# Patient Record
Sex: Female | Born: 1950
Health system: Southern US, Community
[De-identification: ages and names within clinical notes are randomized; demographics above are authoritative.]

## PROBLEM LIST (undated history)

## (undated) DIAGNOSIS — I10 Essential (primary) hypertension: Secondary | ICD-10-CM

## (undated) DIAGNOSIS — K219 Gastro-esophageal reflux disease without esophagitis: Secondary | ICD-10-CM

## (undated) DIAGNOSIS — D649 Anemia, unspecified: Secondary | ICD-10-CM

## (undated) DIAGNOSIS — M1990 Unspecified osteoarthritis, unspecified site: Secondary | ICD-10-CM

## (undated) DIAGNOSIS — R000 Tachycardia, unspecified: Secondary | ICD-10-CM

## (undated) DIAGNOSIS — R0600 Dyspnea, unspecified: Secondary | ICD-10-CM

## (undated) DIAGNOSIS — J189 Pneumonia, unspecified organism: Secondary | ICD-10-CM

## (undated) DIAGNOSIS — C801 Malignant (primary) neoplasm, unspecified: Secondary | ICD-10-CM

## (undated) DIAGNOSIS — K635 Polyp of colon: Secondary | ICD-10-CM

## (undated) DIAGNOSIS — F419 Anxiety disorder, unspecified: Secondary | ICD-10-CM

## (undated) DIAGNOSIS — F32A Depression, unspecified: Secondary | ICD-10-CM

## (undated) HISTORY — PX: APPENDECTOMY: SHX54

## (undated) HISTORY — PX: COLONOSCOPY: SHX174

## (undated) HISTORY — PX: DENTAL SURGERY: SHX609

## (undated) HISTORY — PX: LAPAROSCOPIC SALPINGO OOPHERECTOMY: SHX5927

## (undated) HISTORY — PX: OTHER SURGICAL HISTORY: SHX169

## (undated) HISTORY — PX: MOHS SURGERY: SUR867

## (undated) HISTORY — PX: LINGUAL FRENECTOMY: SHX6357

---

## 1998-06-10 ENCOUNTER — Other Ambulatory Visit: Admission: RE | Admit: 1998-06-10 | Discharge: 1998-06-10 | Payer: Self-pay | Admitting: *Deleted

## 1999-03-28 ENCOUNTER — Ambulatory Visit (HOSPITAL_COMMUNITY): Admission: RE | Admit: 1999-03-28 | Discharge: 1999-03-28 | Payer: Self-pay | Admitting: Family Medicine

## 1999-04-04 ENCOUNTER — Other Ambulatory Visit: Admission: RE | Admit: 1999-04-04 | Discharge: 1999-04-04 | Payer: Self-pay | Admitting: *Deleted

## 1999-09-23 ENCOUNTER — Encounter: Admission: RE | Admit: 1999-09-23 | Discharge: 1999-09-23 | Payer: Self-pay | Admitting: Family Medicine

## 1999-09-23 ENCOUNTER — Encounter: Payer: Self-pay | Admitting: Family Medicine

## 2001-08-17 ENCOUNTER — Other Ambulatory Visit: Admission: RE | Admit: 2001-08-17 | Discharge: 2001-08-17 | Payer: Self-pay | Admitting: Family Medicine

## 2001-11-23 ENCOUNTER — Encounter: Payer: Self-pay | Admitting: Allergy and Immunology

## 2001-11-23 ENCOUNTER — Encounter: Admission: RE | Admit: 2001-11-23 | Discharge: 2001-11-23 | Payer: Self-pay | Admitting: *Deleted

## 2006-07-01 ENCOUNTER — Other Ambulatory Visit: Admission: RE | Admit: 2006-07-01 | Discharge: 2006-07-01 | Payer: Self-pay | Admitting: Family Medicine

## 2010-10-23 ENCOUNTER — Other Ambulatory Visit (HOSPITAL_COMMUNITY)
Admission: RE | Admit: 2010-10-23 | Discharge: 2010-10-23 | Disposition: A | Discharge status: Home / Self Care | Source: Ambulatory Visit | Attending: Family Medicine | Admitting: Family Medicine

## 2010-10-23 DIAGNOSIS — Z01419 Encounter for gynecological examination (general) (routine) without abnormal findings: Secondary | ICD-10-CM | POA: Insufficient documentation

## 2010-10-23 DIAGNOSIS — Z1159 Encounter for screening for other viral diseases: Secondary | ICD-10-CM | POA: Insufficient documentation

## 2011-11-15 DIAGNOSIS — R918 Other nonspecific abnormal finding of lung field: Secondary | ICD-10-CM

## 2011-11-15 HISTORY — DX: Other nonspecific abnormal finding of lung field: R91.8

## 2014-06-29 DIAGNOSIS — D225 Melanocytic nevi of trunk: Secondary | ICD-10-CM | POA: Diagnosis not present

## 2014-06-29 DIAGNOSIS — L820 Inflamed seborrheic keratosis: Secondary | ICD-10-CM | POA: Diagnosis not present

## 2014-06-29 DIAGNOSIS — D1801 Hemangioma of skin and subcutaneous tissue: Secondary | ICD-10-CM | POA: Diagnosis not present

## 2014-06-29 DIAGNOSIS — L814 Other melanin hyperpigmentation: Secondary | ICD-10-CM | POA: Diagnosis not present

## 2014-06-29 DIAGNOSIS — D2271 Melanocytic nevi of right lower limb, including hip: Secondary | ICD-10-CM | POA: Diagnosis not present

## 2014-06-29 DIAGNOSIS — D2239 Melanocytic nevi of other parts of face: Secondary | ICD-10-CM | POA: Diagnosis not present

## 2014-06-29 DIAGNOSIS — L720 Epidermal cyst: Secondary | ICD-10-CM | POA: Diagnosis not present

## 2014-06-29 DIAGNOSIS — Z85828 Personal history of other malignant neoplasm of skin: Secondary | ICD-10-CM | POA: Diagnosis not present

## 2015-05-30 DIAGNOSIS — H401133 Primary open-angle glaucoma, bilateral, severe stage: Secondary | ICD-10-CM | POA: Diagnosis not present

## 2015-06-13 DIAGNOSIS — D2271 Melanocytic nevi of right lower limb, including hip: Secondary | ICD-10-CM | POA: Diagnosis not present

## 2015-06-13 DIAGNOSIS — D225 Melanocytic nevi of trunk: Secondary | ICD-10-CM | POA: Diagnosis not present

## 2015-06-13 DIAGNOSIS — Z85828 Personal history of other malignant neoplasm of skin: Secondary | ICD-10-CM | POA: Diagnosis not present

## 2015-06-13 DIAGNOSIS — D2262 Melanocytic nevi of left upper limb, including shoulder: Secondary | ICD-10-CM | POA: Diagnosis not present

## 2015-06-13 DIAGNOSIS — D2239 Melanocytic nevi of other parts of face: Secondary | ICD-10-CM | POA: Diagnosis not present

## 2015-06-13 DIAGNOSIS — L821 Other seborrheic keratosis: Secondary | ICD-10-CM | POA: Diagnosis not present

## 2015-06-13 DIAGNOSIS — L570 Actinic keratosis: Secondary | ICD-10-CM | POA: Diagnosis not present

## 2015-06-13 DIAGNOSIS — D1801 Hemangioma of skin and subcutaneous tissue: Secondary | ICD-10-CM | POA: Diagnosis not present

## 2015-06-13 DIAGNOSIS — D485 Neoplasm of uncertain behavior of skin: Secondary | ICD-10-CM | POA: Diagnosis not present

## 2015-07-04 DIAGNOSIS — H401133 Primary open-angle glaucoma, bilateral, severe stage: Secondary | ICD-10-CM | POA: Diagnosis not present

## 2015-07-08 DIAGNOSIS — M9902 Segmental and somatic dysfunction of thoracic region: Secondary | ICD-10-CM | POA: Diagnosis not present

## 2015-07-08 DIAGNOSIS — M545 Low back pain: Secondary | ICD-10-CM | POA: Diagnosis not present

## 2015-07-08 DIAGNOSIS — M9903 Segmental and somatic dysfunction of lumbar region: Secondary | ICD-10-CM | POA: Diagnosis not present

## 2015-07-08 DIAGNOSIS — M546 Pain in thoracic spine: Secondary | ICD-10-CM | POA: Diagnosis not present

## 2015-07-10 DIAGNOSIS — M545 Low back pain: Secondary | ICD-10-CM | POA: Diagnosis not present

## 2015-07-10 DIAGNOSIS — M9902 Segmental and somatic dysfunction of thoracic region: Secondary | ICD-10-CM | POA: Diagnosis not present

## 2015-07-10 DIAGNOSIS — M9903 Segmental and somatic dysfunction of lumbar region: Secondary | ICD-10-CM | POA: Diagnosis not present

## 2015-07-10 DIAGNOSIS — M546 Pain in thoracic spine: Secondary | ICD-10-CM | POA: Diagnosis not present

## 2015-07-25 DIAGNOSIS — M546 Pain in thoracic spine: Secondary | ICD-10-CM | POA: Diagnosis not present

## 2015-07-25 DIAGNOSIS — M9903 Segmental and somatic dysfunction of lumbar region: Secondary | ICD-10-CM | POA: Diagnosis not present

## 2015-07-25 DIAGNOSIS — M9902 Segmental and somatic dysfunction of thoracic region: Secondary | ICD-10-CM | POA: Diagnosis not present

## 2015-07-25 DIAGNOSIS — M545 Low back pain: Secondary | ICD-10-CM | POA: Diagnosis not present

## 2015-08-06 DIAGNOSIS — M9903 Segmental and somatic dysfunction of lumbar region: Secondary | ICD-10-CM | POA: Diagnosis not present

## 2015-08-06 DIAGNOSIS — M9902 Segmental and somatic dysfunction of thoracic region: Secondary | ICD-10-CM | POA: Diagnosis not present

## 2015-08-06 DIAGNOSIS — M546 Pain in thoracic spine: Secondary | ICD-10-CM | POA: Diagnosis not present

## 2015-08-06 DIAGNOSIS — M545 Low back pain: Secondary | ICD-10-CM | POA: Diagnosis not present

## 2015-09-24 DIAGNOSIS — E0811 Diabetes mellitus due to underlying condition with ketoacidosis with coma: Secondary | ICD-10-CM | POA: Diagnosis not present

## 2015-09-24 DIAGNOSIS — I10 Essential (primary) hypertension: Secondary | ICD-10-CM | POA: Diagnosis not present

## 2015-09-24 DIAGNOSIS — F331 Major depressive disorder, recurrent, moderate: Secondary | ICD-10-CM | POA: Diagnosis not present

## 2015-09-24 DIAGNOSIS — Z6822 Body mass index (BMI) 22.0-22.9, adult: Secondary | ICD-10-CM | POA: Diagnosis not present

## 2015-09-27 DIAGNOSIS — M546 Pain in thoracic spine: Secondary | ICD-10-CM | POA: Diagnosis not present

## 2015-09-27 DIAGNOSIS — M542 Cervicalgia: Secondary | ICD-10-CM | POA: Diagnosis not present

## 2015-09-27 DIAGNOSIS — M9903 Segmental and somatic dysfunction of lumbar region: Secondary | ICD-10-CM | POA: Diagnosis not present

## 2015-09-27 DIAGNOSIS — M9902 Segmental and somatic dysfunction of thoracic region: Secondary | ICD-10-CM | POA: Diagnosis not present

## 2015-09-27 DIAGNOSIS — M545 Low back pain: Secondary | ICD-10-CM | POA: Diagnosis not present

## 2015-09-27 DIAGNOSIS — M9901 Segmental and somatic dysfunction of cervical region: Secondary | ICD-10-CM | POA: Diagnosis not present

## 2015-10-08 DIAGNOSIS — F331 Major depressive disorder, recurrent, moderate: Secondary | ICD-10-CM | POA: Diagnosis not present

## 2015-10-08 DIAGNOSIS — I10 Essential (primary) hypertension: Secondary | ICD-10-CM | POA: Diagnosis not present

## 2015-10-14 DIAGNOSIS — K921 Melena: Secondary | ICD-10-CM | POA: Diagnosis not present

## 2015-10-14 DIAGNOSIS — R634 Abnormal weight loss: Secondary | ICD-10-CM | POA: Diagnosis not present

## 2015-10-14 DIAGNOSIS — Z800 Family history of malignant neoplasm of digestive organs: Secondary | ICD-10-CM | POA: Diagnosis not present

## 2015-10-15 ENCOUNTER — Other Ambulatory Visit: Payer: Self-pay | Admitting: Family Medicine

## 2015-10-15 DIAGNOSIS — H401190 Primary open-angle glaucoma, unspecified eye, stage unspecified: Secondary | ICD-10-CM

## 2015-10-15 DIAGNOSIS — E2839 Other primary ovarian failure: Secondary | ICD-10-CM

## 2015-10-15 DIAGNOSIS — Z1231 Encounter for screening mammogram for malignant neoplasm of breast: Secondary | ICD-10-CM

## 2015-10-15 HISTORY — DX: Primary open-angle glaucoma, unspecified eye, stage unspecified: H40.1190

## 2015-10-24 DIAGNOSIS — H401133 Primary open-angle glaucoma, bilateral, severe stage: Secondary | ICD-10-CM | POA: Diagnosis not present

## 2015-10-25 DIAGNOSIS — M545 Low back pain: Secondary | ICD-10-CM | POA: Diagnosis not present

## 2015-10-25 DIAGNOSIS — M542 Cervicalgia: Secondary | ICD-10-CM | POA: Diagnosis not present

## 2015-10-25 DIAGNOSIS — M9902 Segmental and somatic dysfunction of thoracic region: Secondary | ICD-10-CM | POA: Diagnosis not present

## 2015-10-25 DIAGNOSIS — M9903 Segmental and somatic dysfunction of lumbar region: Secondary | ICD-10-CM | POA: Diagnosis not present

## 2015-10-25 DIAGNOSIS — M546 Pain in thoracic spine: Secondary | ICD-10-CM | POA: Diagnosis not present

## 2015-10-25 DIAGNOSIS — M9901 Segmental and somatic dysfunction of cervical region: Secondary | ICD-10-CM | POA: Diagnosis not present

## 2015-11-05 DIAGNOSIS — Z1231 Encounter for screening mammogram for malignant neoplasm of breast: Secondary | ICD-10-CM | POA: Diagnosis not present

## 2015-11-07 DIAGNOSIS — M546 Pain in thoracic spine: Secondary | ICD-10-CM | POA: Diagnosis not present

## 2015-11-07 DIAGNOSIS — M9903 Segmental and somatic dysfunction of lumbar region: Secondary | ICD-10-CM | POA: Diagnosis not present

## 2015-11-07 DIAGNOSIS — M9901 Segmental and somatic dysfunction of cervical region: Secondary | ICD-10-CM | POA: Diagnosis not present

## 2015-11-07 DIAGNOSIS — M542 Cervicalgia: Secondary | ICD-10-CM | POA: Diagnosis not present

## 2015-11-07 DIAGNOSIS — M545 Low back pain: Secondary | ICD-10-CM | POA: Diagnosis not present

## 2015-11-07 DIAGNOSIS — M9902 Segmental and somatic dysfunction of thoracic region: Secondary | ICD-10-CM | POA: Diagnosis not present

## 2015-11-08 DIAGNOSIS — I10 Essential (primary) hypertension: Secondary | ICD-10-CM | POA: Diagnosis not present

## 2015-11-08 DIAGNOSIS — F331 Major depressive disorder, recurrent, moderate: Secondary | ICD-10-CM | POA: Diagnosis not present

## 2015-11-08 DIAGNOSIS — R011 Cardiac murmur, unspecified: Secondary | ICD-10-CM | POA: Diagnosis not present

## 2015-11-08 DIAGNOSIS — Z6820 Body mass index (BMI) 20.0-20.9, adult: Secondary | ICD-10-CM | POA: Diagnosis not present

## 2015-11-11 DIAGNOSIS — M9902 Segmental and somatic dysfunction of thoracic region: Secondary | ICD-10-CM | POA: Diagnosis not present

## 2015-11-11 DIAGNOSIS — M545 Low back pain: Secondary | ICD-10-CM | POA: Diagnosis not present

## 2015-11-11 DIAGNOSIS — M546 Pain in thoracic spine: Secondary | ICD-10-CM | POA: Diagnosis not present

## 2015-11-11 DIAGNOSIS — M9903 Segmental and somatic dysfunction of lumbar region: Secondary | ICD-10-CM | POA: Diagnosis not present

## 2015-11-11 DIAGNOSIS — M542 Cervicalgia: Secondary | ICD-10-CM | POA: Diagnosis not present

## 2015-11-11 DIAGNOSIS — I10 Essential (primary) hypertension: Secondary | ICD-10-CM | POA: Diagnosis not present

## 2015-11-11 DIAGNOSIS — M9901 Segmental and somatic dysfunction of cervical region: Secondary | ICD-10-CM | POA: Diagnosis not present

## 2015-11-19 DIAGNOSIS — K625 Hemorrhage of anus and rectum: Secondary | ICD-10-CM | POA: Diagnosis not present

## 2015-11-19 DIAGNOSIS — D126 Benign neoplasm of colon, unspecified: Secondary | ICD-10-CM | POA: Diagnosis not present

## 2015-11-19 DIAGNOSIS — K5730 Diverticulosis of large intestine without perforation or abscess without bleeding: Secondary | ICD-10-CM | POA: Diagnosis not present

## 2015-11-19 DIAGNOSIS — D122 Benign neoplasm of ascending colon: Secondary | ICD-10-CM | POA: Diagnosis not present

## 2015-11-19 DIAGNOSIS — Z800 Family history of malignant neoplasm of digestive organs: Secondary | ICD-10-CM | POA: Diagnosis not present

## 2015-11-21 DIAGNOSIS — D126 Benign neoplasm of colon, unspecified: Secondary | ICD-10-CM | POA: Diagnosis not present

## 2015-11-27 DIAGNOSIS — I6523 Occlusion and stenosis of bilateral carotid arteries: Secondary | ICD-10-CM | POA: Diagnosis not present

## 2015-11-27 DIAGNOSIS — R011 Cardiac murmur, unspecified: Secondary | ICD-10-CM | POA: Diagnosis not present

## 2015-12-14 DIAGNOSIS — Z7982 Long term (current) use of aspirin: Secondary | ICD-10-CM | POA: Insufficient documentation

## 2015-12-14 DIAGNOSIS — R1030 Lower abdominal pain, unspecified: Secondary | ICD-10-CM | POA: Diagnosis present

## 2015-12-14 DIAGNOSIS — K529 Noninfective gastroenteritis and colitis, unspecified: Secondary | ICD-10-CM | POA: Diagnosis not present

## 2015-12-14 DIAGNOSIS — F17200 Nicotine dependence, unspecified, uncomplicated: Secondary | ICD-10-CM | POA: Diagnosis not present

## 2015-12-15 ENCOUNTER — Emergency Department (HOSPITAL_COMMUNITY)
Admission: EM | Admit: 2015-12-15 | Discharge: 2015-12-15 | Disposition: A | Discharge status: Home / Self Care | Payer: Medicare Other | Attending: Emergency Medicine | Admitting: Emergency Medicine

## 2015-12-15 ENCOUNTER — Emergency Department (HOSPITAL_COMMUNITY): Payer: Medicare Other

## 2015-12-15 ENCOUNTER — Encounter (HOSPITAL_COMMUNITY): Payer: Self-pay | Admitting: Emergency Medicine

## 2015-12-15 DIAGNOSIS — K529 Noninfective gastroenteritis and colitis, unspecified: Secondary | ICD-10-CM | POA: Diagnosis not present

## 2015-12-15 DIAGNOSIS — R1030 Lower abdominal pain, unspecified: Secondary | ICD-10-CM | POA: Diagnosis not present

## 2015-12-15 DIAGNOSIS — Z7982 Long term (current) use of aspirin: Secondary | ICD-10-CM | POA: Diagnosis not present

## 2015-12-15 DIAGNOSIS — F17200 Nicotine dependence, unspecified, uncomplicated: Secondary | ICD-10-CM | POA: Diagnosis not present

## 2015-12-15 HISTORY — DX: Polyp of colon: K63.5

## 2015-12-15 LAB — COMPREHENSIVE METABOLIC PANEL
ALBUMIN: 4.2 g/dL (ref 3.5–5.0)
ALK PHOS: 75 U/L (ref 38–126)
ALT: 23 U/L (ref 14–54)
AST: 27 U/L (ref 15–41)
Anion gap: 9 (ref 5–15)
BUN: 16 mg/dL (ref 6–20)
CALCIUM: 9.5 mg/dL (ref 8.9–10.3)
CO2: 26 mmol/L (ref 22–32)
CREATININE: 1.27 mg/dL — AB (ref 0.44–1.00)
Chloride: 102 mmol/L (ref 101–111)
GFR calc Af Amer: 50 mL/min — ABNORMAL LOW (ref >60)
GFR calc non Af Amer: 43 mL/min — ABNORMAL LOW (ref >60)
GLUCOSE: 123 mg/dL — AB (ref 65–99)
Potassium: 3.6 mmol/L (ref 3.5–5.1)
SODIUM: 137 mmol/L (ref 135–145)
Total Bilirubin: 0.7 mg/dL (ref 0.3–1.2)
Total Protein: 6.6 g/dL (ref 6.5–8.1)

## 2015-12-15 LAB — URINE MICROSCOPIC-ADD ON

## 2015-12-15 LAB — CBC
HCT: 44.7 % (ref 36.0–46.0)
Hemoglobin: 14.9 g/dL (ref 12.0–15.0)
MCH: 30.3 pg (ref 26.0–34.0)
MCHC: 33.3 g/dL (ref 30.0–36.0)
MCV: 90.9 fL (ref 78.0–100.0)
PLATELETS: 284 10*3/uL (ref 150–400)
RBC: 4.92 MIL/uL (ref 3.87–5.11)
RDW: 13.5 % (ref 11.5–15.5)
WBC: 12.4 10*3/uL — ABNORMAL HIGH (ref 4.0–10.5)

## 2015-12-15 LAB — URINALYSIS, ROUTINE W REFLEX MICROSCOPIC
BILIRUBIN URINE: NEGATIVE
GLUCOSE, UA: NEGATIVE mg/dL
Ketones, ur: NEGATIVE mg/dL
Nitrite: NEGATIVE
Protein, ur: NEGATIVE mg/dL
SPECIFIC GRAVITY, URINE: 1.007 (ref 1.005–1.030)
pH: 6.5 (ref 5.0–8.0)

## 2015-12-15 LAB — LIPASE, BLOOD: Lipase: 50 U/L (ref 11–51)

## 2015-12-15 MED ORDER — IOPAMIDOL (ISOVUE-300) INJECTION 61%
INTRAVENOUS | Status: AC
  Administered 2015-12-15: 100 mL
  Filled 2015-12-15: qty 100

## 2015-12-15 MED ORDER — ONDANSETRON 4 MG PO TBDP: 4.0000 mg | ORAL_TABLET | Freq: Three times a day (TID) | ORAL | 0 refills | Status: DC | PRN

## 2015-12-15 MED ORDER — SODIUM CHLORIDE 0.9 % IV BOLUS (SEPSIS)
1000.0000 mL | Freq: Once | INTRAVENOUS | Status: AC
  Administered 2015-12-15: 1000 mL via INTRAVENOUS

## 2015-12-15 MED ORDER — ONDANSETRON HCL 4 MG/2ML IJ SOLN
4.0000 mg | Freq: Once | INTRAMUSCULAR | Status: AC
  Administered 2015-12-15: 4 mg via INTRAVENOUS
  Filled 2015-12-15: qty 2

## 2015-12-15 MED ORDER — MORPHINE SULFATE (PF) 4 MG/ML IV SOLN
4.0000 mg | Freq: Once | INTRAVENOUS | Status: AC
  Administered 2015-12-15: 4 mg via INTRAVENOUS
  Filled 2015-12-15: qty 1

## 2015-12-15 NOTE — ED Notes (Signed)
Pt refusing IV fluids, and meds. Pt states she feels better and just wants to rest. Pt drinking water.

## 2015-12-15 NOTE — ED Notes (Signed)
Patient transported to CT 

## 2015-12-15 NOTE — ED Notes (Signed)
EDP explained tests results and plan of care to pt. and family .

## 2015-12-15 NOTE — ED Triage Notes (Signed)
Pt. reports mid/low abdominal pain onset this evening with diarrhea x3 , denies emesis , no fever or chills.

## 2015-12-15 NOTE — ED Provider Notes (Signed)
[facility] DEPT Provider Note   CSN: [id number] Arrival date & time: 12/14/15  2347  By signing my name below, I, Jeanell Sparrow, attest that this documentation has been prepared under the direction and in the presence of Quintella Reichert, MD . Electronically Signed: Jeanell Sparrow, Scribe. 12/15/2015. 12:17 AM.  History   Chief Complaint Chief Complaint  Patient presents with  . Abdominal Pain   The history is provided by the patient. No language interpreter was used.   HPI Comments: Carla Lane is a 65 y.o. female with a hx of HTN who presents to the Emergency Department complaining of constant moderate lower abdominal pain that started about 3 hours ago. She had a sudden onset of pain today. Reports a risk of spoiled food intake yesterday. Pt describes the pain as a pressure sensation, non-radiating, and exacerbated by movement..Associated symptoms include intermittent nausea, diarrhea (3 episodes), generalized weakness, and diaphoresis. Reports a hx of similar symptoms due to swine flu, and a surgical hx of hysterectomy and appendectomy. Denies any fever, vomiting, cough, or SOB.    Past Medical History:  Diagnosis Date  . Colon polyps     There are no active problems to display for this patient.   Past Surgical History:  Procedure Laterality Date  . COLONOSCOPY    . DENTAL SURGERY      OB History    No data available       Home Medications    Prior to Admission medications   Medication Sig Start Date End Date Taking? Authorizing Provider  aspirin 325 MG tablet Take 325 mg by mouth every 6 (six) hours as needed for mild pain.   Yes Historical Provider, MD  escitalopram (LEXAPRO) 10 MG tablet Take 10 mg by mouth daily.   Yes Historical Provider, MD  latanoprost (XALATAN) 0.005 % ophthalmic solution Place 1 drop into both eyes at bedtime.   Yes Historical Provider, MD  Telmisartan-Amlodipine 40-5 MG TABS Take 1 tablet by mouth daily.   Yes Historical Provider, MD    ondansetron (ZOFRAN ODT) 4 MG disintegrating tablet Take 1 tablet (4 mg total) by mouth every 8 (eight) hours as needed for nausea or vomiting. 12/15/15   Quintella Reichert, MD    Family History No family history on file.  Social History Social History  Substance Use Topics  . Smoking status: Current Every Day Smoker  . Smokeless tobacco: Never Used  . Alcohol use Yes     Allergies   Codeine   Review of Systems Review of Systems 10 Systems reviewed and all are negative for acute change except as noted in the HPI.  Physical Exam Updated Vital Signs BP (!) 117/54 (BP Location: Left Arm)   Pulse 60   Temp 97.3 F (36.3 C) (Oral)   Resp 18   SpO2 100%   Physical Exam  Constitutional: She is oriented to person, place, and time. She appears well-developed and well-nourished.  HENT:  Head: Normocephalic and atraumatic.  Cardiovascular: Normal rate and regular rhythm.   No murmur heard. Pulmonary/Chest: Effort normal and breath sounds normal. No respiratory distress.  Abdominal: Soft. There is tenderness. There is no rebound and no guarding.  Mild epigastric TTP.   Musculoskeletal: She exhibits no edema or tenderness.  Neurological: She is alert and oriented to person, place, and time.  Skin: Skin is warm and dry.  Psychiatric: She has a normal mood and affect. Her behavior is normal.  Nursing note and vitals reviewed.    ED  Treatments / Results  DIAGNOSTIC STUDIES: Oxygen Saturation is 100% on RA, normal by my interpretation.    COORDINATION OF CARE: 12:21 AM- Pt advised of plan for treatment and pt agrees.  1:45 AM- Pt was re-evaluated.   Labs (all labs ordered are listed, but only abnormal results are displayed) Labs Reviewed  COMPREHENSIVE METABOLIC PANEL - Abnormal; Notable for the following:       Result Value   Glucose, Bld 123 (*)    Creatinine, Ser 1.27 (*)    GFR calc non Af Amer 43 (*)    GFR calc Af Amer 50 (*)    All other components within normal  limits  CBC - Abnormal; Notable for the following:    WBC 12.4 (*)    All other components within normal limits  URINALYSIS, ROUTINE W REFLEX MICROSCOPIC (NOT AT [facility]) - Abnormal; Notable for the following:    Color, Urine STRAW (*)    Hgb urine dipstick TRACE (*)    Leukocytes, UA SMALL (*)    All other components within normal limits  URINE MICROSCOPIC-ADD ON - Abnormal; Notable for the following:    Squamous Epithelial / LPF 6-30 (*)    Bacteria, UA FEW (*)    All other components within normal limits  LIPASE, BLOOD    EKG  EKG Interpretation None       Radiology Ct Abdomen Pelvis W Contrast  Result Date: 12/15/2015 CLINICAL DATA:  Lower abdominal pain starting about 3 hours ago. Nausea and diarrhea. Weakness and diaphoresis. EXAM: CT ABDOMEN AND PELVIS WITH CONTRAST TECHNIQUE: Multidetector CT imaging of the abdomen and pelvis was performed using the standard protocol following bolus administration of intravenous contrast. CONTRAST:  100 ml ISOVUE-300 IOPAMIDOL (ISOVUE-300) INJECTION 61% COMPARISON:  None. FINDINGS: Lower chest: Linear atelectasis or fibrosis in the lung bases. Hepatobiliary: No focal liver abnormality is seen. No gallstones, gallbladder wall thickening, or biliary dilatation. Pancreas: Unremarkable. No pancreatic ductal dilatation or surrounding inflammatory changes. Spleen: Normal in size without focal abnormality. Adrenals/Urinary Tract: 2.5 cm diameter nodule in the left adrenal gland. Significant washout demonstrated between initial contrast images and delayed images, likely indicating a benign lesion. Renal nephrograms are symmetrical. No hydronephrosis or hydroureter. Bladder wall is not thickened. Stomach/Bowel: Stomach and small bowel are decompressed. Colon is mostly decompressed with scattered stool. There is suggestion of colonic wall thickening and edema in the transverse and descending portion. This may indicate evidence of colitis. Appendix is not  identified. Vascular/Lymphatic: Aortic atherosclerosis. No enlarged abdominal or pelvic lymph nodes. Reproductive: Nodular appearance of the uterus suggesting fibroids. No abnormal adnexal masses. Small amount of free fluid in the pelvis is likely physiologic. Other: Abdominal wall musculature appears intact free air in the abdomen. Musculoskeletal: Spondylolysis with mild spondylolisthesis at L5-S1. No destructive bone lesions. IMPRESSION: Decompression of the bowel limits evaluation but there is suggestion of wall thickening in the colon consistent with colitis. **An incidental finding of potential clinical significance has been found. 2.5 cm diameter nodule in the left adrenal gland with washout demonstrated suggesting a benign lesion. Consider follow-up adrenal CT in 1 year.** Electronically Signed   By: Lucienne Capers M.D.   On: 12/15/2015 04:17    Procedures Procedures (including critical care time)  Medications Ordered in ED Medications  sodium chloride 0.9 % bolus 1,000 mL (0 mLs Intravenous Stopped 12/15/15 0308)  morphine 4 MG/ML injection 4 mg (4 mg Intravenous Given 12/15/15 0220)  ondansetron (ZOFRAN) injection 4 mg (4 mg Intravenous Given 12/15/15  0220)  iopamidol (ISOVUE-300) 61 % injection (100 mLs  Contrast Given 12/15/15 0343)     Initial Impression / Assessment and Plan / ED Course  I have reviewed the triage vital signs and the nursing notes.  Pertinent labs & imaging results that were available during my care of the patient were reviewed by me and considered in my medical decision making (see chart for details).  Clinical Course    Pt here for evaluation of nausea, diarrhea, abdominal pain. Labs with mild leukocytosis, mild dehydration.  CT with colitis, no evidence of diverticulitis.  Pt is improved on recheck.  Plan to d/c home with oral fluid hydration, outpatient follow up, close return precautions.    Final Clinical Impressions(s) / ED Diagnoses   Final diagnoses:    Colitis    New Prescriptions Discharge Medication List as of 12/15/2015  4:47 AM    START taking these medications   Details  ondansetron (ZOFRAN ODT) 4 MG disintegrating tablet Take 1 tablet (4 mg total) by mouth every 8 (eight) hours as needed for nausea or vomiting., Starting Sun 12/15/2015, Print       I personally performed the services described in this documentation, which was scribed in my presence. The recorded information has been reviewed and is accurate.     Quintella Reichert, MD 12/15/15 [facility] [place]

## 2015-12-15 NOTE — Discharge Instructions (Signed)
Follow-up with your family doctor to get your labs rechecked.  Drink plenty of fluids. Your CT scan showed a nodule on your adrenal gland that is recommended to have a repeat CT scan in 1 year. Please follow-up with your family doctor for this.

## 2016-01-21 DIAGNOSIS — I10 Essential (primary) hypertension: Secondary | ICD-10-CM | POA: Diagnosis not present

## 2016-02-14 DIAGNOSIS — M545 Low back pain: Secondary | ICD-10-CM | POA: Diagnosis not present

## 2016-02-14 DIAGNOSIS — M542 Cervicalgia: Secondary | ICD-10-CM | POA: Diagnosis not present

## 2016-02-14 DIAGNOSIS — Z6821 Body mass index (BMI) 21.0-21.9, adult: Secondary | ICD-10-CM | POA: Diagnosis not present

## 2016-02-14 DIAGNOSIS — M546 Pain in thoracic spine: Secondary | ICD-10-CM | POA: Diagnosis not present

## 2016-02-14 DIAGNOSIS — M9903 Segmental and somatic dysfunction of lumbar region: Secondary | ICD-10-CM | POA: Diagnosis not present

## 2016-02-14 DIAGNOSIS — I10 Essential (primary) hypertension: Secondary | ICD-10-CM | POA: Diagnosis not present

## 2016-02-14 DIAGNOSIS — M9902 Segmental and somatic dysfunction of thoracic region: Secondary | ICD-10-CM | POA: Diagnosis not present

## 2016-02-14 DIAGNOSIS — M9901 Segmental and somatic dysfunction of cervical region: Secondary | ICD-10-CM | POA: Diagnosis not present

## 2016-02-14 DIAGNOSIS — F331 Major depressive disorder, recurrent, moderate: Secondary | ICD-10-CM | POA: Diagnosis not present

## 2016-02-14 DIAGNOSIS — K2970 Gastritis, unspecified, without bleeding: Secondary | ICD-10-CM | POA: Diagnosis not present

## 2016-02-28 DIAGNOSIS — D0461 Carcinoma in situ of skin of right upper limb, including shoulder: Secondary | ICD-10-CM | POA: Diagnosis not present

## 2016-02-28 DIAGNOSIS — D0462 Carcinoma in situ of skin of left upper limb, including shoulder: Secondary | ICD-10-CM | POA: Diagnosis not present

## 2016-02-28 DIAGNOSIS — Z85828 Personal history of other malignant neoplasm of skin: Secondary | ICD-10-CM | POA: Diagnosis not present

## 2016-02-28 DIAGNOSIS — D485 Neoplasm of uncertain behavior of skin: Secondary | ICD-10-CM | POA: Diagnosis not present

## 2016-05-11 DIAGNOSIS — M9902 Segmental and somatic dysfunction of thoracic region: Secondary | ICD-10-CM | POA: Diagnosis not present

## 2016-05-11 DIAGNOSIS — M545 Low back pain: Secondary | ICD-10-CM | POA: Diagnosis not present

## 2016-05-11 DIAGNOSIS — M9901 Segmental and somatic dysfunction of cervical region: Secondary | ICD-10-CM | POA: Diagnosis not present

## 2016-05-11 DIAGNOSIS — M542 Cervicalgia: Secondary | ICD-10-CM | POA: Diagnosis not present

## 2016-05-11 DIAGNOSIS — M546 Pain in thoracic spine: Secondary | ICD-10-CM | POA: Diagnosis not present

## 2016-05-11 DIAGNOSIS — M9903 Segmental and somatic dysfunction of lumbar region: Secondary | ICD-10-CM | POA: Diagnosis not present

## 2016-05-13 DIAGNOSIS — M542 Cervicalgia: Secondary | ICD-10-CM | POA: Diagnosis not present

## 2016-05-13 DIAGNOSIS — M545 Low back pain: Secondary | ICD-10-CM | POA: Diagnosis not present

## 2016-05-13 DIAGNOSIS — M546 Pain in thoracic spine: Secondary | ICD-10-CM | POA: Diagnosis not present

## 2016-05-13 DIAGNOSIS — M9901 Segmental and somatic dysfunction of cervical region: Secondary | ICD-10-CM | POA: Diagnosis not present

## 2016-05-13 DIAGNOSIS — M9903 Segmental and somatic dysfunction of lumbar region: Secondary | ICD-10-CM | POA: Diagnosis not present

## 2016-05-13 DIAGNOSIS — M9902 Segmental and somatic dysfunction of thoracic region: Secondary | ICD-10-CM | POA: Diagnosis not present

## 2016-06-21 DIAGNOSIS — K0889 Other specified disorders of teeth and supporting structures: Secondary | ICD-10-CM | POA: Diagnosis not present

## 2016-06-21 DIAGNOSIS — G501 Atypical facial pain: Secondary | ICD-10-CM | POA: Diagnosis not present

## 2016-07-08 DIAGNOSIS — D2261 Melanocytic nevi of right upper limb, including shoulder: Secondary | ICD-10-CM | POA: Diagnosis not present

## 2016-07-08 DIAGNOSIS — Z85828 Personal history of other malignant neoplasm of skin: Secondary | ICD-10-CM | POA: Diagnosis not present

## 2016-07-08 DIAGNOSIS — L821 Other seborrheic keratosis: Secondary | ICD-10-CM | POA: Diagnosis not present

## 2016-07-08 DIAGNOSIS — D1801 Hemangioma of skin and subcutaneous tissue: Secondary | ICD-10-CM | POA: Diagnosis not present

## 2016-07-08 DIAGNOSIS — L820 Inflamed seborrheic keratosis: Secondary | ICD-10-CM | POA: Diagnosis not present

## 2016-07-08 DIAGNOSIS — D225 Melanocytic nevi of trunk: Secondary | ICD-10-CM | POA: Diagnosis not present

## 2016-07-08 DIAGNOSIS — D2272 Melanocytic nevi of left lower limb, including hip: Secondary | ICD-10-CM | POA: Diagnosis not present

## 2016-07-08 DIAGNOSIS — D2271 Melanocytic nevi of right lower limb, including hip: Secondary | ICD-10-CM | POA: Diagnosis not present

## 2016-07-08 DIAGNOSIS — L814 Other melanin hyperpigmentation: Secondary | ICD-10-CM | POA: Diagnosis not present

## 2016-07-08 DIAGNOSIS — L570 Actinic keratosis: Secondary | ICD-10-CM | POA: Diagnosis not present

## 2016-07-08 DIAGNOSIS — D2262 Melanocytic nevi of left upper limb, including shoulder: Secondary | ICD-10-CM | POA: Diagnosis not present

## 2016-07-13 ENCOUNTER — Other Ambulatory Visit (HOSPITAL_COMMUNITY)
Admission: RE | Admit: 2016-07-13 | Discharge: 2016-07-13 | Disposition: A | Discharge status: Home / Self Care | Payer: Medicare Other | Source: Ambulatory Visit | Attending: Family Medicine | Admitting: Family Medicine

## 2016-07-13 ENCOUNTER — Other Ambulatory Visit: Payer: Self-pay | Admitting: Family Medicine

## 2016-07-13 DIAGNOSIS — I10 Essential (primary) hypertension: Secondary | ICD-10-CM | POA: Diagnosis not present

## 2016-07-13 DIAGNOSIS — Z113 Encounter for screening for infections with a predominantly sexual mode of transmission: Secondary | ICD-10-CM | POA: Diagnosis not present

## 2016-07-13 DIAGNOSIS — N771 Vaginitis, vulvitis and vulvovaginitis in diseases classified elsewhere: Secondary | ICD-10-CM | POA: Diagnosis not present

## 2016-07-13 DIAGNOSIS — F331 Major depressive disorder, recurrent, moderate: Secondary | ICD-10-CM | POA: Diagnosis not present

## 2016-07-13 DIAGNOSIS — R252 Cramp and spasm: Secondary | ICD-10-CM | POA: Diagnosis not present

## 2016-07-15 DIAGNOSIS — B029 Zoster without complications: Secondary | ICD-10-CM | POA: Diagnosis not present

## 2016-07-15 DIAGNOSIS — Z85828 Personal history of other malignant neoplasm of skin: Secondary | ICD-10-CM | POA: Diagnosis not present

## 2016-07-16 LAB — URINE CYTOLOGY ANCILLARY ONLY
Bacterial vaginitis: NEGATIVE
CANDIDA VAGINITIS: NEGATIVE
CHLAMYDIA, DNA PROBE: NEGATIVE
Neisseria Gonorrhea: NEGATIVE
Trichomonas: NEGATIVE

## 2016-08-13 DIAGNOSIS — I10 Essential (primary) hypertension: Secondary | ICD-10-CM | POA: Diagnosis not present

## 2016-08-13 DIAGNOSIS — R252 Cramp and spasm: Secondary | ICD-10-CM | POA: Diagnosis not present

## 2016-08-13 DIAGNOSIS — Z681 Body mass index (BMI) 19 or less, adult: Secondary | ICD-10-CM | POA: Diagnosis not present

## 2016-08-13 DIAGNOSIS — F331 Major depressive disorder, recurrent, moderate: Secondary | ICD-10-CM | POA: Diagnosis not present

## 2016-09-10 DIAGNOSIS — I10 Essential (primary) hypertension: Secondary | ICD-10-CM | POA: Diagnosis not present

## 2016-09-10 DIAGNOSIS — F331 Major depressive disorder, recurrent, moderate: Secondary | ICD-10-CM | POA: Diagnosis not present

## 2016-10-09 ENCOUNTER — Ambulatory Visit (INDEPENDENT_AMBULATORY_CARE_PROVIDER_SITE_OTHER): Payer: Medicare Other

## 2016-10-09 ENCOUNTER — Encounter: Payer: Self-pay | Admitting: *Deleted

## 2016-10-09 DIAGNOSIS — I10 Essential (primary) hypertension: Secondary | ICD-10-CM | POA: Diagnosis not present

## 2016-10-09 NOTE — Progress Notes (Signed)
Patient ID: Carla Lane, female   DOB: 1950-05-09, 66 y.o.   MRN: [id number] 24 hour ambulatory blood pressure monitor applied using adult small cuff.

## 2016-10-16 DIAGNOSIS — I10 Essential (primary) hypertension: Secondary | ICD-10-CM | POA: Diagnosis not present

## 2016-11-06 DIAGNOSIS — I10 Essential (primary) hypertension: Secondary | ICD-10-CM | POA: Diagnosis not present

## 2016-11-30 IMAGING — CT CT ABD-PELV W/ CM
2 of 5 series · 16 of 46 positions shown, 18 images · IV contrast (iopamidol)
Comparison: None.

CLINICAL DATA: Lower abdominal pain starting about 3 hours ago.
Nausea and diarrhea. Weakness and diaphoresis.

EXAM:
CT ABDOMEN AND PELVIS WITH CONTRAST
TECHNIQUE: Multidetector CT imaging of the abdomen and pelvis was performed
using the standard protocol following bolus administration of
intravenous contrast.
CONTRAST:  100 ml PHCPIN-DJJ IOPAMIDOL (PHCPIN-DJJ) INJECTION 61%

[Series 2: abd/ pelvis 5.0 i30f 1 · axial · 0.76mm/px · z∈[-472,-112]mm · 13 of 82 slices shown, 15 images]
[im 5/82  soft-tissue]
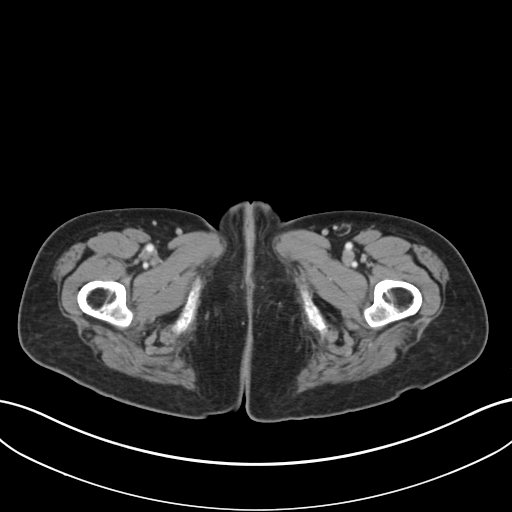
[im 5/82  bone]
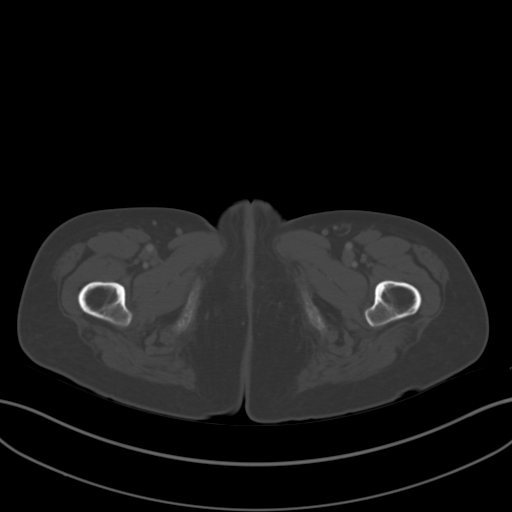
[im 13/82  soft-tissue]
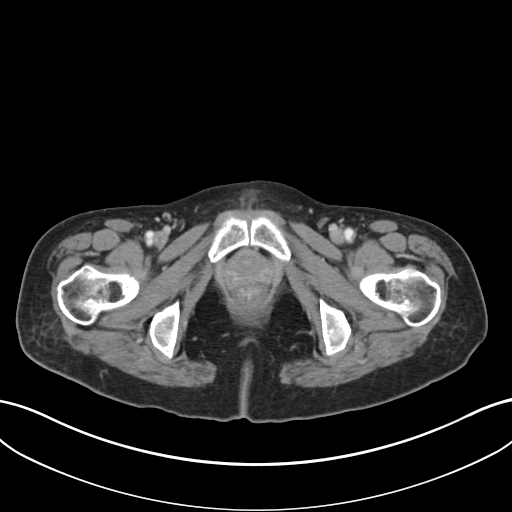
[im 18/82  soft-tissue]
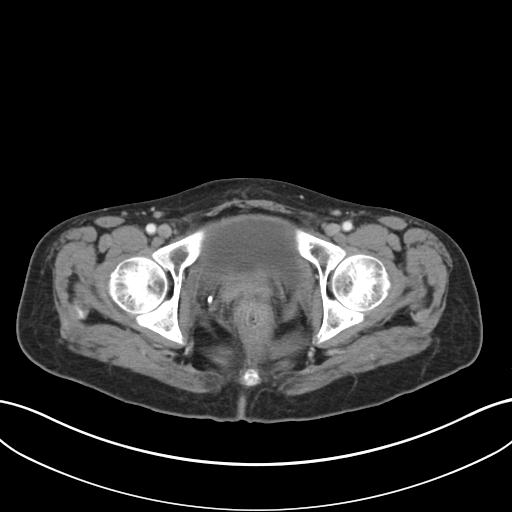
[im 22/82  soft-tissue]
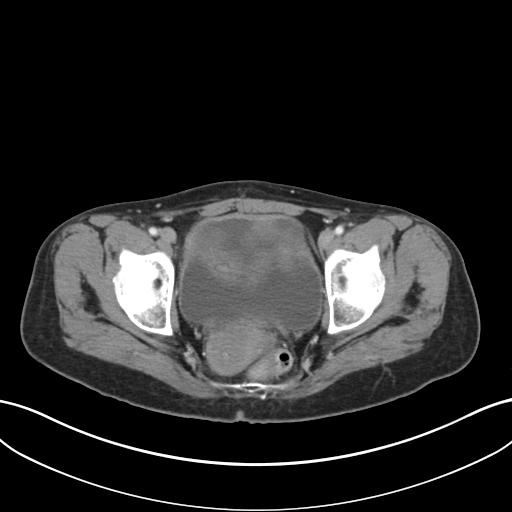
[im 30/82  soft-tissue]
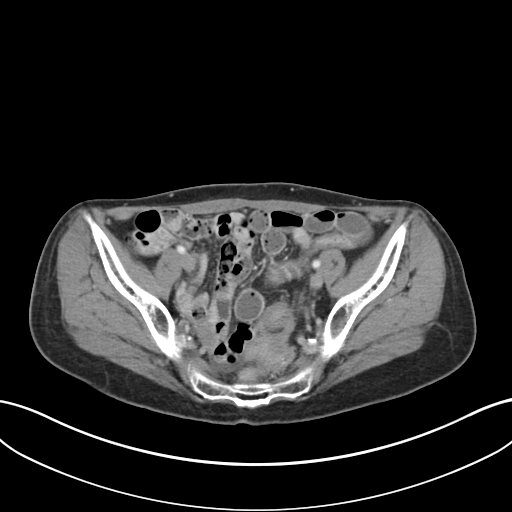
[im 35/82  soft-tissue]
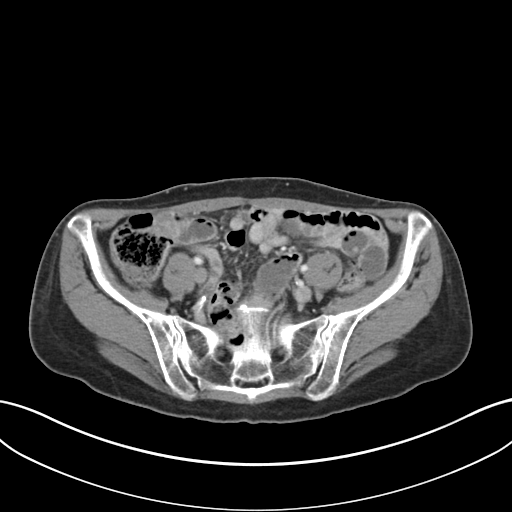
[im 43/82  soft-tissue]
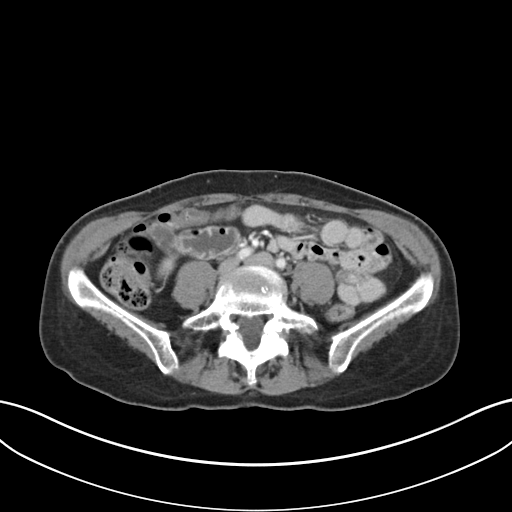
[im 47/82  soft-tissue]
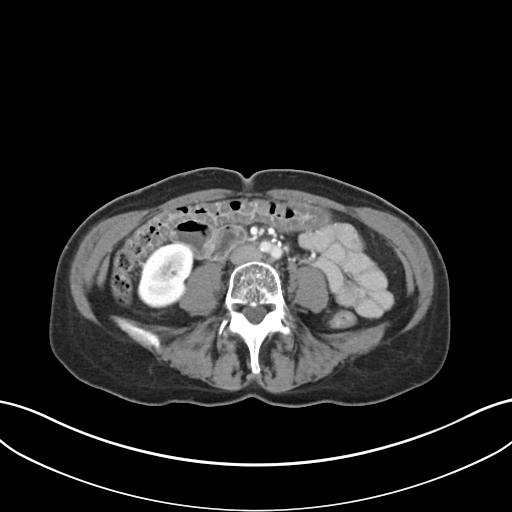
[im 52/82  soft-tissue]
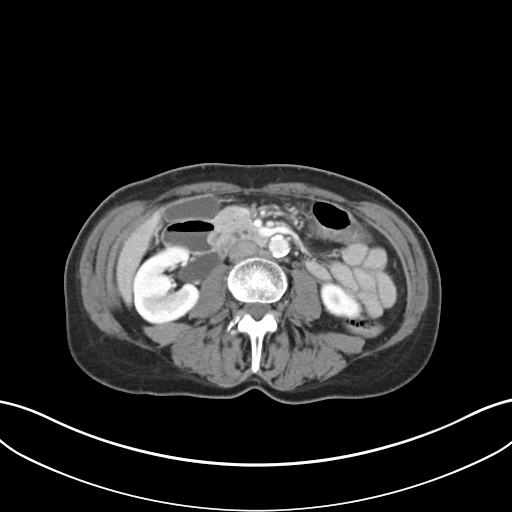
[im 52/82  bone]
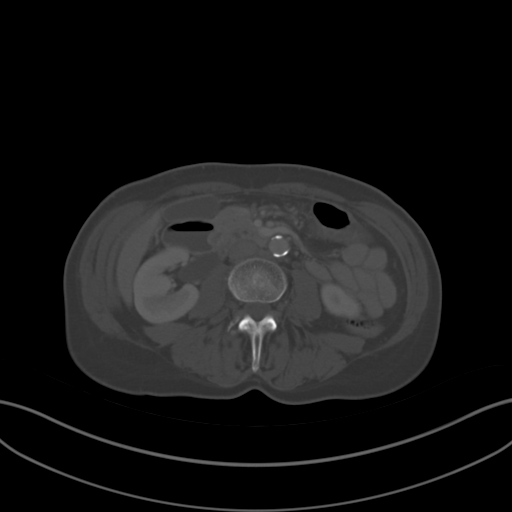
[im 60/82  soft-tissue]
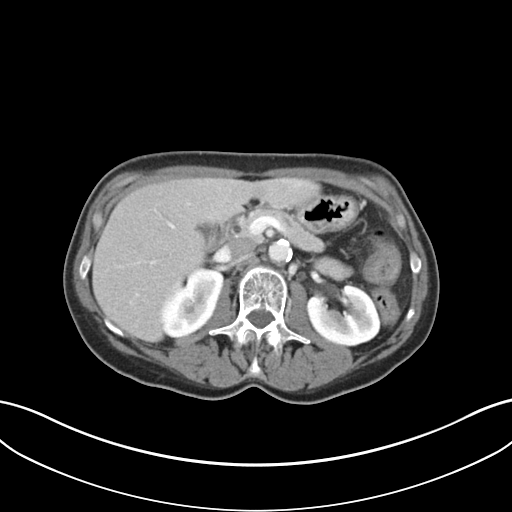
[im 64/82  soft-tissue]
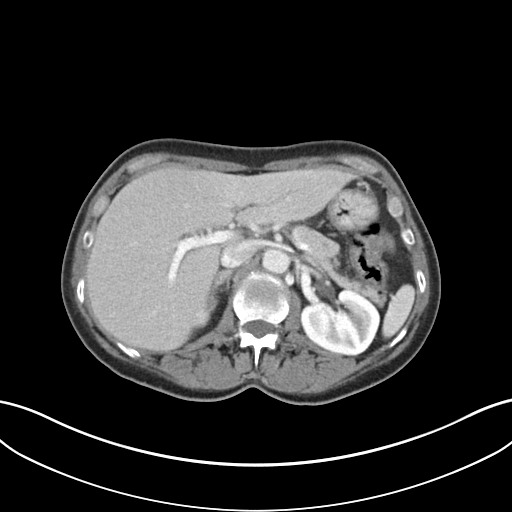
[im 69/82  soft-tissue]
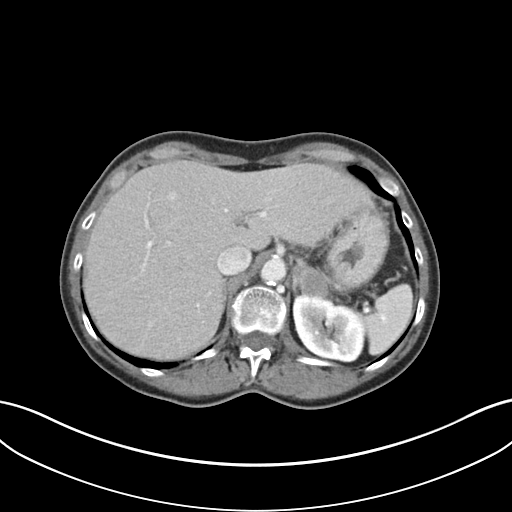
[im 77/82  soft-tissue]
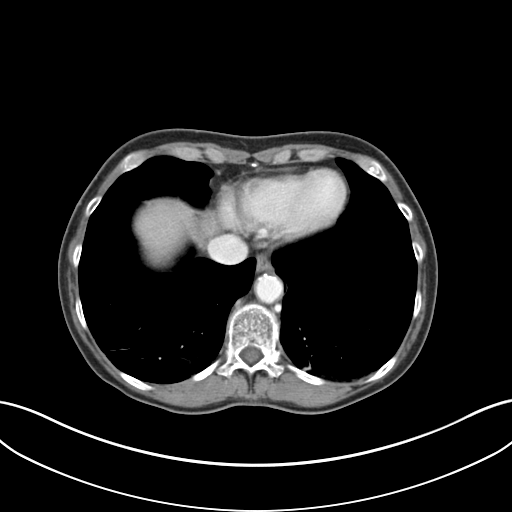

[Series 5: coronal soft tissue · coronal · 0.66mm/px · 3 of 74 slices shown]
[im 25/74  soft-tissue]
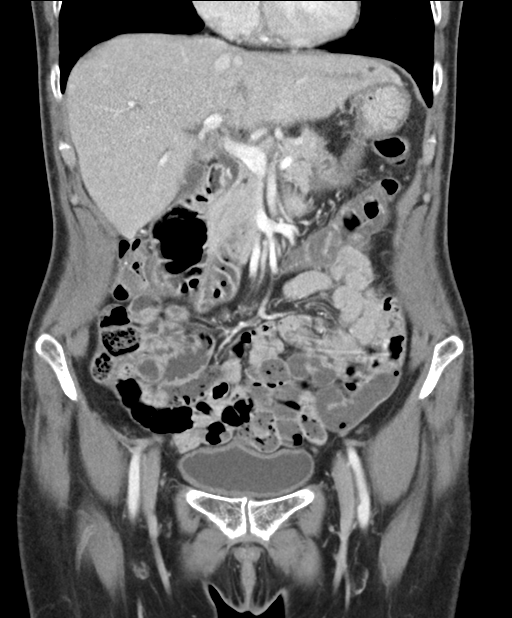
[im 33/74  soft-tissue]
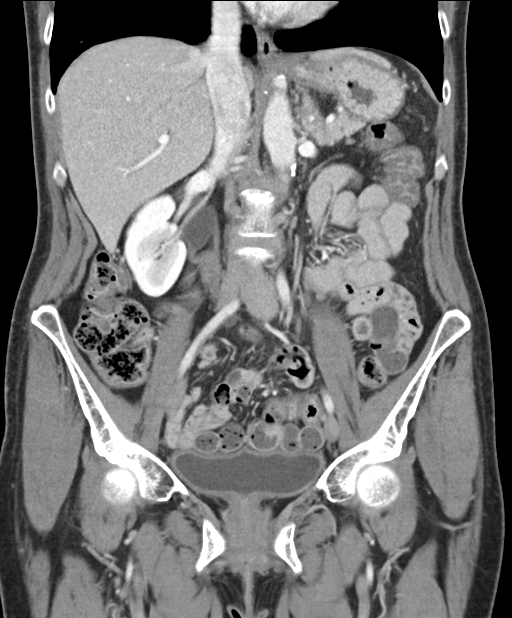
[im 41/74  soft-tissue]
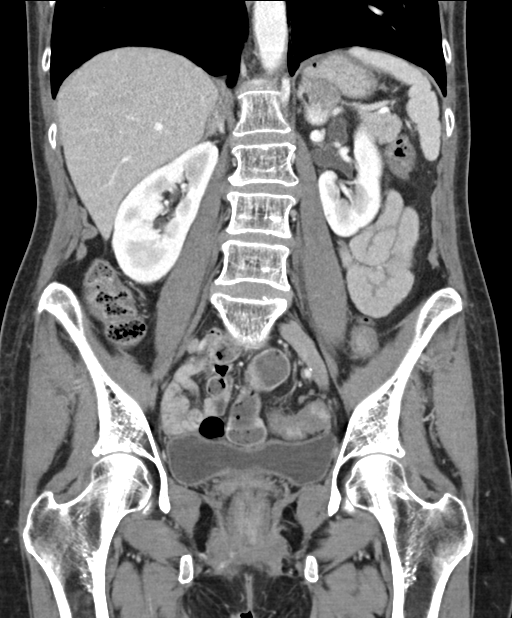

[16 of 46 positions shown; findings below may reference images not displayed]

FINDINGS: Lower chest: Linear atelectasis or fibrosis in the lung bases.

Hepatobiliary: No focal liver abnormality is seen. No gallstones,
gallbladder wall thickening, or biliary dilatation.

Pancreas: Unremarkable. No pancreatic ductal dilatation or
surrounding inflammatory changes.

Spleen: Normal in size without focal abnormality.

Adrenals/Urinary Tract: 2.5 cm diameter nodule in the left adrenal
gland. Significant washout demonstrated between initial contrast
images and delayed images, likely indicating a benign lesion. Renal
nephrograms are symmetrical. No hydronephrosis or hydroureter.
Bladder wall is not thickened.

Stomach/Bowel: Stomach and small bowel are decompressed. Colon is
mostly decompressed with scattered stool. There is suggestion of
colonic wall thickening and edema in the transverse and descending
portion. This may indicate evidence of colitis. Appendix is not
identified.

Vascular/Lymphatic: Aortic atherosclerosis. No enlarged abdominal or
pelvic lymph nodes.

Reproductive: Nodular appearance of the uterus suggesting fibroids.
No abnormal adnexal masses. Small amount of free fluid in the pelvis
is likely physiologic.

Other: Abdominal wall musculature appears intact free air in the
abdomen.

Musculoskeletal: Spondylolysis with mild spondylolisthesis at L5-S1.
No destructive bone lesions.
IMPRESSION: Decompression of the bowel limits evaluation but there is suggestion
of wall thickening in the colon consistent with colitis.

**An incidental finding of potential clinical significance has been
found. 2.5 cm diameter nodule in the left adrenal gland with washout
demonstrated suggesting a benign lesion. Consider follow-up adrenal
CT in 1 year.**

## 2017-01-20 DIAGNOSIS — L720 Epidermal cyst: Secondary | ICD-10-CM | POA: Diagnosis not present

## 2017-01-20 DIAGNOSIS — L820 Inflamed seborrheic keratosis: Secondary | ICD-10-CM | POA: Diagnosis not present

## 2017-01-20 DIAGNOSIS — Z85828 Personal history of other malignant neoplasm of skin: Secondary | ICD-10-CM | POA: Diagnosis not present

## 2017-05-14 DIAGNOSIS — H269 Unspecified cataract: Secondary | ICD-10-CM

## 2017-05-14 HISTORY — DX: Unspecified cataract: H26.9

## 2017-05-31 DIAGNOSIS — H2513 Age-related nuclear cataract, bilateral: Secondary | ICD-10-CM | POA: Diagnosis not present

## 2017-05-31 DIAGNOSIS — H401133 Primary open-angle glaucoma, bilateral, severe stage: Secondary | ICD-10-CM | POA: Diagnosis not present

## 2017-06-18 DIAGNOSIS — C44212 Basal cell carcinoma of skin of right ear and external auricular canal: Secondary | ICD-10-CM | POA: Diagnosis not present

## 2017-06-18 DIAGNOSIS — D485 Neoplasm of uncertain behavior of skin: Secondary | ICD-10-CM | POA: Diagnosis not present

## 2017-06-18 DIAGNOSIS — Z85828 Personal history of other malignant neoplasm of skin: Secondary | ICD-10-CM | POA: Diagnosis not present

## 2017-07-01 DIAGNOSIS — C44212 Basal cell carcinoma of skin of right ear and external auricular canal: Secondary | ICD-10-CM | POA: Diagnosis not present

## 2017-07-01 DIAGNOSIS — Z85828 Personal history of other malignant neoplasm of skin: Secondary | ICD-10-CM | POA: Diagnosis not present

## 2017-08-18 DIAGNOSIS — D225 Melanocytic nevi of trunk: Secondary | ICD-10-CM | POA: Diagnosis not present

## 2017-08-18 DIAGNOSIS — D2262 Melanocytic nevi of left upper limb, including shoulder: Secondary | ICD-10-CM | POA: Diagnosis not present

## 2017-08-18 DIAGNOSIS — D2261 Melanocytic nevi of right upper limb, including shoulder: Secondary | ICD-10-CM | POA: Diagnosis not present

## 2017-08-18 DIAGNOSIS — L821 Other seborrheic keratosis: Secondary | ICD-10-CM | POA: Diagnosis not present

## 2017-08-18 DIAGNOSIS — D2272 Melanocytic nevi of left lower limb, including hip: Secondary | ICD-10-CM | POA: Diagnosis not present

## 2017-08-18 DIAGNOSIS — D1801 Hemangioma of skin and subcutaneous tissue: Secondary | ICD-10-CM | POA: Diagnosis not present

## 2017-08-18 DIAGNOSIS — L820 Inflamed seborrheic keratosis: Secondary | ICD-10-CM | POA: Diagnosis not present

## 2017-08-18 DIAGNOSIS — D2271 Melanocytic nevi of right lower limb, including hip: Secondary | ICD-10-CM | POA: Diagnosis not present

## 2017-08-18 DIAGNOSIS — Z85828 Personal history of other malignant neoplasm of skin: Secondary | ICD-10-CM | POA: Diagnosis not present

## 2017-09-18 DIAGNOSIS — R21 Rash and other nonspecific skin eruption: Secondary | ICD-10-CM | POA: Diagnosis not present

## 2017-09-18 DIAGNOSIS — B029 Zoster without complications: Secondary | ICD-10-CM | POA: Diagnosis not present

## 2017-12-23 DIAGNOSIS — H401133 Primary open-angle glaucoma, bilateral, severe stage: Secondary | ICD-10-CM | POA: Diagnosis not present

## 2018-03-25 DIAGNOSIS — M542 Cervicalgia: Secondary | ICD-10-CM | POA: Diagnosis not present

## 2018-03-25 DIAGNOSIS — I10 Essential (primary) hypertension: Secondary | ICD-10-CM | POA: Diagnosis not present

## 2018-03-25 DIAGNOSIS — M9902 Segmental and somatic dysfunction of thoracic region: Secondary | ICD-10-CM | POA: Diagnosis not present

## 2018-03-25 DIAGNOSIS — M546 Pain in thoracic spine: Secondary | ICD-10-CM | POA: Diagnosis not present

## 2018-03-25 DIAGNOSIS — M9901 Segmental and somatic dysfunction of cervical region: Secondary | ICD-10-CM | POA: Diagnosis not present

## 2018-03-25 DIAGNOSIS — M545 Low back pain: Secondary | ICD-10-CM | POA: Diagnosis not present

## 2018-03-25 DIAGNOSIS — M9903 Segmental and somatic dysfunction of lumbar region: Secondary | ICD-10-CM | POA: Diagnosis not present

## 2018-07-27 DIAGNOSIS — M546 Pain in thoracic spine: Secondary | ICD-10-CM | POA: Diagnosis not present

## 2018-07-27 DIAGNOSIS — M9902 Segmental and somatic dysfunction of thoracic region: Secondary | ICD-10-CM | POA: Diagnosis not present

## 2018-07-27 DIAGNOSIS — M9901 Segmental and somatic dysfunction of cervical region: Secondary | ICD-10-CM | POA: Diagnosis not present

## 2018-07-27 DIAGNOSIS — M542 Cervicalgia: Secondary | ICD-10-CM | POA: Diagnosis not present

## 2018-10-14 ENCOUNTER — Other Ambulatory Visit: Payer: Self-pay

## 2019-02-24 DIAGNOSIS — I10 Essential (primary) hypertension: Secondary | ICD-10-CM | POA: Diagnosis not present

## 2019-02-24 DIAGNOSIS — F331 Major depressive disorder, recurrent, moderate: Secondary | ICD-10-CM | POA: Diagnosis not present

## 2019-02-24 DIAGNOSIS — F064 Anxiety disorder due to known physiological condition: Secondary | ICD-10-CM | POA: Diagnosis not present

## 2019-04-13 ENCOUNTER — Other Ambulatory Visit (HOSPITAL_COMMUNITY): Payer: Self-pay | Admitting: Family Medicine

## 2019-04-13 ENCOUNTER — Other Ambulatory Visit: Payer: Self-pay | Admitting: Family Medicine

## 2019-04-13 DIAGNOSIS — R1310 Dysphagia, unspecified: Secondary | ICD-10-CM

## 2019-04-20 ENCOUNTER — Ambulatory Visit (HOSPITAL_COMMUNITY)
Admission: RE | Admit: 2019-04-20 | Discharge: 2019-04-20 | Disposition: A | Discharge status: Home / Self Care | Payer: Medicare Other | Source: Ambulatory Visit | Attending: Family Medicine | Admitting: Family Medicine

## 2019-04-20 ENCOUNTER — Other Ambulatory Visit: Payer: Self-pay

## 2019-04-20 DIAGNOSIS — R1310 Dysphagia, unspecified: Secondary | ICD-10-CM | POA: Diagnosis not present

## 2019-05-09 ENCOUNTER — Other Ambulatory Visit (HOSPITAL_COMMUNITY)
Admission: RE | Admit: 2019-05-09 | Discharge: 2019-05-09 | Disposition: A | Discharge status: Home / Self Care | Payer: Medicare Other | Source: Ambulatory Visit | Attending: Family Medicine | Admitting: Family Medicine

## 2019-05-09 DIAGNOSIS — N898 Other specified noninflammatory disorders of vagina: Secondary | ICD-10-CM | POA: Diagnosis present

## 2019-05-11 LAB — MOLECULAR ANCILLARY ONLY???
Bacterial Vaginitis (gardnerella): NEGATIVE
Candida Glabrata: NEGATIVE
Comment: NEGATIVE
Comment: NEGATIVE
Comment: NORMAL
Neisseria Gonorrhea: NEGATIVE
Trichomonas: NEGATIVE

## 2019-05-11 LAB — MOLECULAR ANCILLARY ONLY
Candida Vaginitis: POSITIVE — AB
Chlamydia: NEGATIVE
Comment: NEGATIVE
Comment: NEGATIVE
Comment: NEGATIVE

## 2019-07-22 ENCOUNTER — Ambulatory Visit: Payer: Medicare Other | Attending: Internal Medicine

## 2019-07-22 DIAGNOSIS — Z23 Encounter for immunization: Secondary | ICD-10-CM

## 2019-07-22 NOTE — Progress Notes (Signed)
   Covid-19 Vaccination Clinic  Name:  Carla Lane    MRN: [id number] DOB: [birth year]  07/22/2019  Carla Lane was observed post Covid-19 immunization for 15 minutes without incident. She was provided with Vaccine Information Sheet and instruction to access the V-Safe system.   Carla Lane was instructed to call 911 with any severe reactions post vaccine: Marland Kitchen Difficulty breathing  . Swelling of face and throat  . A fast heartbeat  . A bad rash all over body  . Dizziness and weakness   Immunizations Administered    Name Date Dose VIS Date Route   Pfizer COVID-19 Vaccine 07/22/2019  8:27 AM 0.3 mL 05/10/2018 Intramuscular   Manufacturer: [place]   Lot: [id number]   [place]: [id number]

## 2019-08-15 ENCOUNTER — Ambulatory Visit: Payer: Medicare Other | Attending: Internal Medicine

## 2019-08-15 DIAGNOSIS — Z23 Encounter for immunization: Secondary | ICD-10-CM

## 2019-08-15 NOTE — Progress Notes (Signed)
   Covid-19 Vaccination Clinic  Name:  Carla Lane    MRN: [id number] DOB: February 09, 1951  08/15/2019  [name] was observed post Covid-19 immunization for 15 minutes without incident. She was provided with Vaccine Information Sheet and instruction to access the V-Safe system.   [name] was instructed to call 911 with any severe reactions post vaccine: Marland Kitchen Difficulty breathing  . Swelling of face and throat  . A fast heartbeat  . A bad rash all over body  . Dizziness and weakness   Immunizations Administered    Name Date Dose VIS Date Route   Pfizer COVID-19 Vaccine 08/15/2019  8:51 AM 0.3 mL 05/10/2018 Intramuscular   Manufacturer: [place]   Lot: [id number]   [place]: [id number]

## 2020-05-03 DIAGNOSIS — M9905 Segmental and somatic dysfunction of pelvic region: Secondary | ICD-10-CM | POA: Diagnosis not present

## 2020-05-03 DIAGNOSIS — M9903 Segmental and somatic dysfunction of lumbar region: Secondary | ICD-10-CM | POA: Diagnosis not present

## 2020-05-03 DIAGNOSIS — M79672 Pain in left foot: Secondary | ICD-10-CM | POA: Diagnosis not present

## 2020-05-03 DIAGNOSIS — M9902 Segmental and somatic dysfunction of thoracic region: Secondary | ICD-10-CM | POA: Diagnosis not present

## 2020-05-03 DIAGNOSIS — M62830 Muscle spasm of back: Secondary | ICD-10-CM | POA: Diagnosis not present

## 2020-05-03 DIAGNOSIS — M546 Pain in thoracic spine: Secondary | ICD-10-CM | POA: Diagnosis not present

## 2020-05-03 DIAGNOSIS — M9906 Segmental and somatic dysfunction of lower extremity: Secondary | ICD-10-CM | POA: Diagnosis not present

## 2020-05-08 DIAGNOSIS — M9903 Segmental and somatic dysfunction of lumbar region: Secondary | ICD-10-CM | POA: Diagnosis not present

## 2020-05-08 DIAGNOSIS — M9902 Segmental and somatic dysfunction of thoracic region: Secondary | ICD-10-CM | POA: Diagnosis not present

## 2020-05-08 DIAGNOSIS — M9905 Segmental and somatic dysfunction of pelvic region: Secondary | ICD-10-CM | POA: Diagnosis not present

## 2020-05-08 DIAGNOSIS — M62830 Muscle spasm of back: Secondary | ICD-10-CM | POA: Diagnosis not present

## 2020-05-08 DIAGNOSIS — M79672 Pain in left foot: Secondary | ICD-10-CM | POA: Diagnosis not present

## 2020-05-08 DIAGNOSIS — M546 Pain in thoracic spine: Secondary | ICD-10-CM | POA: Diagnosis not present

## 2020-05-08 DIAGNOSIS — M9906 Segmental and somatic dysfunction of lower extremity: Secondary | ICD-10-CM | POA: Diagnosis not present

## 2020-05-14 DIAGNOSIS — H524 Presbyopia: Secondary | ICD-10-CM | POA: Diagnosis not present

## 2020-05-14 DIAGNOSIS — H401133 Primary open-angle glaucoma, bilateral, severe stage: Secondary | ICD-10-CM | POA: Diagnosis not present

## 2020-07-10 DIAGNOSIS — Z0000 Encounter for general adult medical examination without abnormal findings: Secondary | ICD-10-CM | POA: Diagnosis not present

## 2020-07-16 DIAGNOSIS — G2581 Restless legs syndrome: Secondary | ICD-10-CM | POA: Diagnosis not present

## 2020-07-16 DIAGNOSIS — F331 Major depressive disorder, recurrent, moderate: Secondary | ICD-10-CM | POA: Diagnosis not present

## 2020-07-16 DIAGNOSIS — L989 Disorder of the skin and subcutaneous tissue, unspecified: Secondary | ICD-10-CM | POA: Diagnosis not present

## 2020-07-16 DIAGNOSIS — F064 Anxiety disorder due to known physiological condition: Secondary | ICD-10-CM | POA: Diagnosis not present

## 2020-07-16 DIAGNOSIS — I10 Essential (primary) hypertension: Secondary | ICD-10-CM | POA: Diagnosis not present

## 2020-07-16 HISTORY — DX: Restless legs syndrome: G25.81

## 2020-07-23 DIAGNOSIS — F064 Anxiety disorder due to known physiological condition: Secondary | ICD-10-CM | POA: Diagnosis not present

## 2020-07-23 DIAGNOSIS — Z7189 Other specified counseling: Secondary | ICD-10-CM | POA: Diagnosis not present

## 2020-07-23 DIAGNOSIS — F331 Major depressive disorder, recurrent, moderate: Secondary | ICD-10-CM | POA: Diagnosis not present

## 2020-07-23 DIAGNOSIS — I10 Essential (primary) hypertension: Secondary | ICD-10-CM | POA: Diagnosis not present

## 2020-07-30 DIAGNOSIS — Z85828 Personal history of other malignant neoplasm of skin: Secondary | ICD-10-CM | POA: Diagnosis not present

## 2020-07-30 DIAGNOSIS — D2239 Melanocytic nevi of other parts of face: Secondary | ICD-10-CM | POA: Diagnosis not present

## 2020-07-30 DIAGNOSIS — D1801 Hemangioma of skin and subcutaneous tissue: Secondary | ICD-10-CM | POA: Diagnosis not present

## 2020-07-30 DIAGNOSIS — C44629 Squamous cell carcinoma of skin of left upper limb, including shoulder: Secondary | ICD-10-CM | POA: Diagnosis not present

## 2020-07-30 DIAGNOSIS — D225 Melanocytic nevi of trunk: Secondary | ICD-10-CM | POA: Diagnosis not present

## 2020-07-30 DIAGNOSIS — C44311 Basal cell carcinoma of skin of nose: Secondary | ICD-10-CM | POA: Diagnosis not present

## 2020-07-30 DIAGNOSIS — C44529 Squamous cell carcinoma of skin of other part of trunk: Secondary | ICD-10-CM | POA: Diagnosis not present

## 2020-07-30 DIAGNOSIS — D2271 Melanocytic nevi of right lower limb, including hip: Secondary | ICD-10-CM | POA: Diagnosis not present

## 2020-07-30 DIAGNOSIS — D485 Neoplasm of uncertain behavior of skin: Secondary | ICD-10-CM | POA: Diagnosis not present

## 2020-07-30 DIAGNOSIS — L821 Other seborrheic keratosis: Secondary | ICD-10-CM | POA: Diagnosis not present

## 2020-08-02 DIAGNOSIS — I10 Essential (primary) hypertension: Secondary | ICD-10-CM | POA: Diagnosis not present

## 2020-08-07 DIAGNOSIS — F39 Unspecified mood [affective] disorder: Secondary | ICD-10-CM | POA: Diagnosis not present

## 2020-08-28 DIAGNOSIS — I10 Essential (primary) hypertension: Secondary | ICD-10-CM | POA: Diagnosis not present

## 2020-08-28 DIAGNOSIS — F331 Major depressive disorder, recurrent, moderate: Secondary | ICD-10-CM | POA: Diagnosis not present

## 2020-08-29 DIAGNOSIS — Z85828 Personal history of other malignant neoplasm of skin: Secondary | ICD-10-CM | POA: Diagnosis not present

## 2020-08-29 DIAGNOSIS — C44311 Basal cell carcinoma of skin of nose: Secondary | ICD-10-CM | POA: Diagnosis not present

## 2020-09-04 DIAGNOSIS — C44529 Squamous cell carcinoma of skin of other part of trunk: Secondary | ICD-10-CM | POA: Diagnosis not present

## 2020-09-04 DIAGNOSIS — Z85828 Personal history of other malignant neoplasm of skin: Secondary | ICD-10-CM | POA: Diagnosis not present

## 2020-09-12 DIAGNOSIS — M720 Palmar fascial fibromatosis [Dupuytren]: Secondary | ICD-10-CM | POA: Insufficient documentation

## 2020-09-20 DIAGNOSIS — Z20822 Contact with and (suspected) exposure to covid-19: Secondary | ICD-10-CM | POA: Diagnosis not present

## 2020-09-25 DIAGNOSIS — F331 Major depressive disorder, recurrent, moderate: Secondary | ICD-10-CM | POA: Diagnosis not present

## 2020-10-10 DIAGNOSIS — J019 Acute sinusitis, unspecified: Secondary | ICD-10-CM | POA: Diagnosis not present

## 2020-10-25 DIAGNOSIS — J309 Allergic rhinitis, unspecified: Secondary | ICD-10-CM | POA: Diagnosis not present

## 2020-10-25 DIAGNOSIS — J301 Allergic rhinitis due to pollen: Secondary | ICD-10-CM | POA: Diagnosis not present

## 2020-10-25 DIAGNOSIS — B370 Candidal stomatitis: Secondary | ICD-10-CM | POA: Diagnosis not present

## 2020-10-25 DIAGNOSIS — L989 Disorder of the skin and subcutaneous tissue, unspecified: Secondary | ICD-10-CM | POA: Diagnosis not present

## 2020-10-25 DIAGNOSIS — F064 Anxiety disorder due to known physiological condition: Secondary | ICD-10-CM | POA: Diagnosis not present

## 2020-10-25 DIAGNOSIS — I10 Essential (primary) hypertension: Secondary | ICD-10-CM | POA: Diagnosis not present

## 2020-10-25 DIAGNOSIS — E064 Drug-induced thyroiditis: Secondary | ICD-10-CM | POA: Diagnosis not present

## 2020-10-25 DIAGNOSIS — J984 Other disorders of lung: Secondary | ICD-10-CM | POA: Diagnosis not present

## 2020-11-05 DIAGNOSIS — I10 Essential (primary) hypertension: Secondary | ICD-10-CM | POA: Diagnosis not present

## 2020-11-05 DIAGNOSIS — B370 Candidal stomatitis: Secondary | ICD-10-CM | POA: Diagnosis not present

## 2020-11-05 DIAGNOSIS — L989 Disorder of the skin and subcutaneous tissue, unspecified: Secondary | ICD-10-CM | POA: Diagnosis not present

## 2020-12-05 DIAGNOSIS — I10 Essential (primary) hypertension: Secondary | ICD-10-CM | POA: Diagnosis not present

## 2020-12-13 ENCOUNTER — Other Ambulatory Visit: Payer: Self-pay | Admitting: Family Medicine

## 2020-12-13 DIAGNOSIS — I719 Aortic aneurysm of unspecified site, without rupture: Secondary | ICD-10-CM

## 2020-12-13 DIAGNOSIS — F17210 Nicotine dependence, cigarettes, uncomplicated: Secondary | ICD-10-CM

## 2020-12-20 ENCOUNTER — Ambulatory Visit
Admission: RE | Admit: 2020-12-20 | Discharge: 2020-12-20 | Disposition: A | Discharge status: Home / Self Care | Payer: Medicare Other | Source: Ambulatory Visit | Attending: Family Medicine | Admitting: Family Medicine

## 2020-12-20 DIAGNOSIS — I719 Aortic aneurysm of unspecified site, without rupture: Secondary | ICD-10-CM

## 2020-12-20 DIAGNOSIS — F17210 Nicotine dependence, cigarettes, uncomplicated: Secondary | ICD-10-CM | POA: Diagnosis not present

## 2021-01-02 ENCOUNTER — Other Ambulatory Visit (HOSPITAL_COMMUNITY)
Admission: RE | Admit: 2021-01-02 | Discharge: 2021-01-02 | Disposition: A | Discharge status: Home / Self Care | Payer: Medicare Other | Source: Ambulatory Visit | Attending: Family Medicine | Admitting: Family Medicine

## 2021-01-02 ENCOUNTER — Other Ambulatory Visit: Payer: Self-pay | Admitting: Family Medicine

## 2021-01-02 DIAGNOSIS — F419 Anxiety disorder, unspecified: Secondary | ICD-10-CM | POA: Diagnosis not present

## 2021-01-02 DIAGNOSIS — N898 Other specified noninflammatory disorders of vagina: Secondary | ICD-10-CM | POA: Diagnosis not present

## 2021-01-02 DIAGNOSIS — B379 Candidiasis, unspecified: Secondary | ICD-10-CM | POA: Diagnosis not present

## 2021-01-02 DIAGNOSIS — I10 Essential (primary) hypertension: Secondary | ICD-10-CM | POA: Diagnosis not present

## 2021-01-02 DIAGNOSIS — Z23 Encounter for immunization: Secondary | ICD-10-CM | POA: Diagnosis not present

## 2021-01-03 LAB — MOLECULAR ANCILLARY ONLY
Bacterial Vaginitis (gardnerella): NEGATIVE
Candida Glabrata: NEGATIVE
Candida Vaginitis: NEGATIVE
Chlamydia: NEGATIVE
Comment: NEGATIVE
Comment: NEGATIVE
Comment: NEGATIVE
Comment: NEGATIVE
Comment: NEGATIVE
Comment: NORMAL
Neisseria Gonorrhea: NEGATIVE
Trichomonas: NEGATIVE

## 2021-01-28 DIAGNOSIS — H401133 Primary open-angle glaucoma, bilateral, severe stage: Secondary | ICD-10-CM | POA: Diagnosis not present

## 2021-02-03 DIAGNOSIS — F331 Major depressive disorder, recurrent, moderate: Secondary | ICD-10-CM | POA: Diagnosis not present

## 2021-02-03 DIAGNOSIS — F419 Anxiety disorder, unspecified: Secondary | ICD-10-CM | POA: Diagnosis not present

## 2021-02-03 DIAGNOSIS — I10 Essential (primary) hypertension: Secondary | ICD-10-CM | POA: Diagnosis not present

## 2021-02-03 DIAGNOSIS — J029 Acute pharyngitis, unspecified: Secondary | ICD-10-CM | POA: Diagnosis not present

## 2021-02-18 DIAGNOSIS — Z23 Encounter for immunization: Secondary | ICD-10-CM | POA: Diagnosis not present

## 2021-03-25 DIAGNOSIS — F064 Anxiety disorder due to known physiological condition: Secondary | ICD-10-CM | POA: Diagnosis not present

## 2021-03-25 DIAGNOSIS — I10 Essential (primary) hypertension: Secondary | ICD-10-CM | POA: Diagnosis not present

## 2021-03-25 DIAGNOSIS — Z716 Tobacco abuse counseling: Secondary | ICD-10-CM | POA: Diagnosis not present

## 2021-03-25 DIAGNOSIS — J399 Disease of upper respiratory tract, unspecified: Secondary | ICD-10-CM | POA: Diagnosis not present

## 2021-03-27 ENCOUNTER — Other Ambulatory Visit (HOSPITAL_COMMUNITY): Payer: Self-pay | Admitting: Family Medicine

## 2021-03-27 ENCOUNTER — Other Ambulatory Visit: Payer: Self-pay | Admitting: Family Medicine

## 2021-03-27 DIAGNOSIS — F17210 Nicotine dependence, cigarettes, uncomplicated: Secondary | ICD-10-CM

## 2021-04-10 DIAGNOSIS — H401133 Primary open-angle glaucoma, bilateral, severe stage: Secondary | ICD-10-CM | POA: Diagnosis not present

## 2021-05-06 ENCOUNTER — Encounter (HOSPITAL_COMMUNITY): Payer: Self-pay

## 2021-05-06 ENCOUNTER — Ambulatory Visit (HOSPITAL_COMMUNITY): Admission: RE | Admit: 2021-05-06 | Payer: Medicare Other | Source: Ambulatory Visit

## 2021-06-04 DIAGNOSIS — M79672 Pain in left foot: Secondary | ICD-10-CM | POA: Diagnosis not present

## 2021-06-04 DIAGNOSIS — M9901 Segmental and somatic dysfunction of cervical region: Secondary | ICD-10-CM | POA: Diagnosis not present

## 2021-06-04 DIAGNOSIS — M9906 Segmental and somatic dysfunction of lower extremity: Secondary | ICD-10-CM | POA: Diagnosis not present

## 2021-06-04 DIAGNOSIS — M26602 Left temporomandibular joint disorder, unspecified: Secondary | ICD-10-CM | POA: Diagnosis not present

## 2021-06-16 DIAGNOSIS — M26602 Left temporomandibular joint disorder, unspecified: Secondary | ICD-10-CM | POA: Diagnosis not present

## 2021-06-16 DIAGNOSIS — M9901 Segmental and somatic dysfunction of cervical region: Secondary | ICD-10-CM | POA: Diagnosis not present

## 2021-07-18 ENCOUNTER — Other Ambulatory Visit: Payer: Self-pay | Admitting: Obstetrics and Gynecology

## 2021-07-18 DIAGNOSIS — R413 Other amnesia: Secondary | ICD-10-CM | POA: Diagnosis not present

## 2021-07-18 DIAGNOSIS — F419 Anxiety disorder, unspecified: Secondary | ICD-10-CM | POA: Diagnosis not present

## 2021-07-18 DIAGNOSIS — I10 Essential (primary) hypertension: Secondary | ICD-10-CM | POA: Diagnosis not present

## 2021-07-18 DIAGNOSIS — Z716 Tobacco abuse counseling: Secondary | ICD-10-CM | POA: Diagnosis not present

## 2021-07-18 DIAGNOSIS — G2581 Restless legs syndrome: Secondary | ICD-10-CM | POA: Diagnosis not present

## 2021-07-18 DIAGNOSIS — Z720 Tobacco use: Secondary | ICD-10-CM

## 2021-07-18 DIAGNOSIS — Z87891 Personal history of nicotine dependence: Secondary | ICD-10-CM | POA: Diagnosis not present

## 2021-07-18 DIAGNOSIS — Z0000 Encounter for general adult medical examination without abnormal findings: Secondary | ICD-10-CM | POA: Diagnosis not present

## 2021-07-25 DIAGNOSIS — Z1231 Encounter for screening mammogram for malignant neoplasm of breast: Secondary | ICD-10-CM | POA: Diagnosis not present

## 2021-07-25 DIAGNOSIS — Z780 Asymptomatic menopausal state: Secondary | ICD-10-CM | POA: Diagnosis not present

## 2021-07-25 DIAGNOSIS — M810 Age-related osteoporosis without current pathological fracture: Secondary | ICD-10-CM | POA: Diagnosis not present

## 2021-08-18 DIAGNOSIS — I10 Essential (primary) hypertension: Secondary | ICD-10-CM | POA: Diagnosis not present

## 2021-08-18 DIAGNOSIS — F064 Anxiety disorder due to known physiological condition: Secondary | ICD-10-CM | POA: Diagnosis not present

## 2021-08-18 DIAGNOSIS — F419 Anxiety disorder, unspecified: Secondary | ICD-10-CM | POA: Diagnosis not present

## 2021-08-18 DIAGNOSIS — M818 Other osteoporosis without current pathological fracture: Secondary | ICD-10-CM | POA: Diagnosis not present

## 2021-08-18 DIAGNOSIS — G2581 Restless legs syndrome: Secondary | ICD-10-CM | POA: Diagnosis not present

## 2021-09-04 ENCOUNTER — Ambulatory Visit (HOSPITAL_COMMUNITY)
Admission: RE | Admit: 2021-09-04 | Discharge: 2021-09-04 | Disposition: A | Discharge status: Home / Self Care | Payer: Medicare Other | Source: Ambulatory Visit | Attending: Family Medicine | Admitting: Family Medicine

## 2021-09-04 DIAGNOSIS — F17210 Nicotine dependence, cigarettes, uncomplicated: Secondary | ICD-10-CM

## 2021-09-09 ENCOUNTER — Other Ambulatory Visit: Payer: Self-pay | Admitting: Radiology

## 2021-09-09 DIAGNOSIS — D2261 Melanocytic nevi of right upper limb, including shoulder: Secondary | ICD-10-CM | POA: Diagnosis not present

## 2021-09-09 DIAGNOSIS — I788 Other diseases of capillaries: Secondary | ICD-10-CM | POA: Diagnosis not present

## 2021-09-09 DIAGNOSIS — L821 Other seborrheic keratosis: Secondary | ICD-10-CM | POA: Diagnosis not present

## 2021-09-09 DIAGNOSIS — H401133 Primary open-angle glaucoma, bilateral, severe stage: Secondary | ICD-10-CM | POA: Diagnosis not present

## 2021-09-09 DIAGNOSIS — D2262 Melanocytic nevi of left upper limb, including shoulder: Secondary | ICD-10-CM | POA: Diagnosis not present

## 2021-09-09 DIAGNOSIS — D1801 Hemangioma of skin and subcutaneous tissue: Secondary | ICD-10-CM | POA: Diagnosis not present

## 2021-09-09 DIAGNOSIS — H2513 Age-related nuclear cataract, bilateral: Secondary | ICD-10-CM | POA: Diagnosis not present

## 2021-09-09 DIAGNOSIS — Z85828 Personal history of other malignant neoplasm of skin: Secondary | ICD-10-CM | POA: Diagnosis not present

## 2021-11-14 DIAGNOSIS — R0600 Dyspnea, unspecified: Secondary | ICD-10-CM

## 2021-11-14 HISTORY — DX: Dyspnea, unspecified: R06.00

## 2021-11-26 ENCOUNTER — Encounter: Payer: Self-pay | Admitting: Pulmonary Disease

## 2021-11-26 ENCOUNTER — Ambulatory Visit (INDEPENDENT_AMBULATORY_CARE_PROVIDER_SITE_OTHER): Payer: Medicare Other | Admitting: Pulmonary Disease

## 2021-11-26 VITALS — BP 130/60 | HR 58 | Ht 63.5 in | Wt 120.6 lb

## 2021-11-26 DIAGNOSIS — R911 Solitary pulmonary nodule: Secondary | ICD-10-CM

## 2021-11-26 DIAGNOSIS — C3431 Malignant neoplasm of lower lobe, right bronchus or lung: Secondary | ICD-10-CM | POA: Insufficient documentation

## 2021-11-26 DIAGNOSIS — Z720 Tobacco use: Secondary | ICD-10-CM

## 2021-11-26 DIAGNOSIS — F17200 Nicotine dependence, unspecified, uncomplicated: Secondary | ICD-10-CM

## 2021-11-26 NOTE — Patient Instructions (Signed)
Thank you for visiting Dr. Valeta Harms at [facility]. Today we recommend the following:  Orders Placed This Encounter  Procedures   Procedural/ Surgical Case Request: ROBOTIC ASSISTED NAVIGATIONAL BRONCHOSCOPY, VIDEO BRONCHOSCOPY WITH ENDOBRONCHIAL ULTRASOUND   NM PET SUPER D CT   CT Super D Chest Wo Contrast   Ambulatory referral to Pulmonology   Bronchoscopy on 12/09/2021  Return in about 20 days (around 12/16/2021) for with Eric Form, NP.    Please do your part to reduce the spread of COVID-19.

## 2021-11-26 NOTE — Progress Notes (Signed)
Synopsis: Referred in September 2023 for lung mass by Lucianne Lei, MD  Subjective:   PATIENT ID: Carla Lane GENDER: female DOB: 24-Jan-1951, MRN: [id number]  Chief Complaint  Patient presents with   Consult    Lung mass    This is a 71 year old female, past medical history of hypertension, current every day smoker.Patient had a lung cancer screening CT on 09/04/2021 which was found to have a right lower lobe 26.5 mm lung nodule.  She was referred for further evaluation.  Patient has no respiratory complaints today.  She is retired from [employer].  Has lived all over the country.  She has a longstanding history of smoking for the last 60 years.  She states that she is intermittently compliant with her blood pressure medications.  She does not routinely take her blood pressure medications every day.  I told her that it is important for her to follow-up with her primary care doctor to discuss this.    Past Medical History:  Diagnosis Date   Colon polyps      No family history on file.   Past Surgical History:  Procedure Laterality Date   COLONOSCOPY     DENTAL SURGERY      Social History   Socioeconomic History   Marital status: Married    Spouse name: Not on file   Number of children: Not on file   Years of education: Not on file   Highest education level: Not on file  Occupational History   Not on file  Tobacco Use   Smoking status: Every Day   Smokeless tobacco: Never   Tobacco comments:    Smoking 7 packs of cigarettes a week. 11/26/2021 [place]  Substance and Sexual Activity   Alcohol use: Yes   Drug use: Not on file   Sexual activity: Never  Other Topics Concern   Not on file  Social History Narrative   Not on file   Social Determinants of Health   Financial Resource Strain: Not on file  Food Insecurity: Not on file  Transportation Needs: Not on file  Physical Activity: Not on file  Stress: Not on file  Social Connections: Not on file  Intimate  Partner Violence: Not on file     Allergies  Allergen Reactions   Codeine Other (See Comments)    White spots on throat, strep throat     Outpatient Medications Prior to Visit  Medication Sig Dispense Refill   aspirin 325 MG tablet Take 325 mg by mouth every 6 (six) hours as needed for mild pain.     escitalopram (LEXAPRO) 10 MG tablet Take 10 mg by mouth daily.     latanoprost (XALATAN) 0.005 % ophthalmic solution Place 1 drop into both eyes at bedtime.     ondansetron (ZOFRAN ODT) 4 MG disintegrating tablet Take 1 tablet (4 mg total) by mouth every 8 (eight) hours as needed for nausea or vomiting. 8 tablet 0   Telmisartan-Amlodipine 40-5 MG TABS Take 1 tablet by mouth daily.     No facility-administered medications prior to visit.    Review of Systems  Constitutional:  Negative for chills, fever, malaise/fatigue and weight loss.  HENT:  Negative for hearing loss, sore throat and tinnitus.   Eyes:  Negative for blurred vision and double vision.  Respiratory:  Positive for shortness of breath. Negative for cough, hemoptysis, sputum production, wheezing and stridor.   Cardiovascular:  Negative for chest pain, palpitations, orthopnea, leg swelling and PND.  Gastrointestinal:  Negative for abdominal pain, constipation, diarrhea, heartburn, nausea and vomiting.  Genitourinary:  Negative for dysuria, hematuria and urgency.  Musculoskeletal:  Negative for joint pain and myalgias.  Skin:  Negative for itching and rash.  Neurological:  Negative for dizziness, tingling, weakness and headaches.  Endo/Heme/Allergies:  Negative for environmental allergies. Does not bruise/bleed easily.  Psychiatric/Behavioral:  Negative for depression. The patient is not nervous/anxious and does not have insomnia.   All other systems reviewed and are negative.    Objective:  Physical Exam Vitals reviewed.  Constitutional:      General: She is not in acute distress.    Appearance: She is well-developed.   HENT:     Head: Normocephalic and atraumatic.  Eyes:     General: No scleral icterus.    Conjunctiva/sclera: Conjunctivae normal.     Pupils: Pupils are equal, round, and reactive to light.  Neck:     Vascular: No JVD.     Trachea: No tracheal deviation.  Cardiovascular:     Rate and Rhythm: Normal rate and regular rhythm.     Heart sounds: Normal heart sounds. No murmur heard. Pulmonary:     Effort: Pulmonary effort is normal. No tachypnea, accessory muscle usage or respiratory distress.     Breath sounds: No stridor. No wheezing, rhonchi or rales.  Abdominal:     General: There is no distension.     Palpations: Abdomen is soft.     Tenderness: There is no abdominal tenderness.  Musculoskeletal:        General: No tenderness.     Cervical back: Neck supple.  Lymphadenopathy:     Cervical: No cervical adenopathy.  Skin:    General: Skin is warm and dry.     Capillary Refill: Capillary refill takes less than 2 seconds.     Findings: No rash.  Neurological:     Mental Status: She is alert and oriented to person, place, and time.  Psychiatric:        Behavior: Behavior normal.      Vitals:   11/26/21 1029  BP: 130/60  Pulse: (!) 58  SpO2: 100%  Weight: 120 lb 9.6 oz (54.7 kg)  Height: 5' 3.5" (1.613 m)   100% on RA BMI Readings from Last 3 Encounters:  11/26/21 21.03 kg/m   Wt Readings from Last 3 Encounters:  11/26/21 120 lb 9.6 oz (54.7 kg)     CBC    Component Value Date/Time   WBC 12.4 (H) 12/15/2015 0017   RBC 4.92 12/15/2015 0017   HGB 14.9 12/15/2015 0017   HCT 44.7 12/15/2015 0017   PLT 284 12/15/2015 0017   MCV 90.9 12/15/2015 0017   MCH 30.3 12/15/2015 0017   MCHC 33.3 12/15/2015 0017   RDW 13.5 12/15/2015 0017     Chest Imaging: LDCT June: 77mm RLL lung nodule Concerning for malignancy The patient's images have been independently reviewed by me.    Pulmonary Functions Testing Results:     No data to display          FeNO:    Pathology:   Echocardiogram:   Heart Catheterization:     Assessment & Plan:     ICD-10-CM   1. Lung nodule  R91.1 NM PET SUPER D CT    CT Super D Chest Wo Contrast    Procedural/ Surgical Case Request: ROBOTIC ASSISTED NAVIGATIONAL BRONCHOSCOPY, VIDEO BRONCHOSCOPY WITH ENDOBRONCHIAL ULTRASOUND    Ambulatory referral to Pulmonology    2. Current  smoker  F17.200       Discussion:  This is a 71 year old female, past medical history of tobacco use, abnormal lung cancer screening CT, 26 mm right lower lobe pulmonary nodule concerning for primary bronchogenic carcinoma.  She is not taking a daily aspirin.  She is not on any blood thinners or antiplatelets.  Plan: Today in the office we talked about the risk benefits alternatives of consideration of bronchoscopy and biopsy. Patient is agreeable to proceed. We talked at the risk of bleeding and pneumothorax. Tentative bronchoscopy date will be on 12/09/2021. I have ordered a super D CT chest as well as a nuclear medicine PET scan.  Hopefully we get this done prior to her procedure. We appreciate the PCC's help with scheduling of bronchoscopy. She will have a 1 week follow-up appointment after bronchoscopy.   Current Outpatient Medications:    aspirin 325 MG tablet, Take 325 mg by mouth every 6 (six) hours as needed for mild pain., Disp: , Rfl:    escitalopram (LEXAPRO) 10 MG tablet, Take 10 mg by mouth daily., Disp: , Rfl:    latanoprost (XALATAN) 0.005 % ophthalmic solution, Place 1 drop into both eyes at bedtime., Disp: , Rfl:    ondansetron (ZOFRAN ODT) 4 MG disintegrating tablet, Take 1 tablet (4 mg total) by mouth every 8 (eight) hours as needed for nausea or vomiting., Disp: 8 tablet, Rfl: 0   Telmisartan-Amlodipine 40-5 MG TABS, Take 1 tablet by mouth daily., Disp: , Rfl:   I spent 62 minutes dedicated to the care of this patient on the date of this encounter to include pre-visit review of records, face-to-face time with the  patient discussing conditions above, post visit ordering of testing, clinical documentation with the electronic health record, making appropriate referrals as documented, and communicating necessary findings to members of the patients care team.   Garner Nash, DO [facility] 11/26/2021 10:51 AM

## 2021-12-04 ENCOUNTER — Encounter (HOSPITAL_COMMUNITY)
Admission: RE | Admit: 2021-12-04 | Discharge: 2021-12-04 | Disposition: A | Discharge status: Home / Self Care | Payer: Medicare Other | Source: Ambulatory Visit | Attending: Pulmonary Disease | Admitting: Pulmonary Disease

## 2021-12-04 DIAGNOSIS — R918 Other nonspecific abnormal finding of lung field: Secondary | ICD-10-CM | POA: Diagnosis not present

## 2021-12-04 DIAGNOSIS — R911 Solitary pulmonary nodule: Secondary | ICD-10-CM | POA: Diagnosis not present

## 2021-12-04 MED ORDER — FLUDEOXYGLUCOSE F - 18 (FDG) INJECTION
6.4200 | Freq: Once | INTRAVENOUS | Status: AC | PRN
  Administered 2021-12-04: 6.42 via INTRAVENOUS

## 2021-12-05 ENCOUNTER — Other Ambulatory Visit: Payer: Medicare Other

## 2021-12-05 ENCOUNTER — Ambulatory Visit (HOSPITAL_COMMUNITY)
Admission: RE | Admit: 2021-12-05 | Discharge: 2021-12-05 | Disposition: A | Discharge status: Home / Self Care | Payer: Medicare Other | Source: Ambulatory Visit | Attending: Pulmonary Disease | Admitting: Pulmonary Disease

## 2021-12-05 DIAGNOSIS — R911 Solitary pulmonary nodule: Secondary | ICD-10-CM

## 2021-12-05 DIAGNOSIS — J439 Emphysema, unspecified: Secondary | ICD-10-CM | POA: Diagnosis not present

## 2021-12-07 LAB — SPECIMEN STATUS REPORT

## 2021-12-07 LAB — NOVEL CORONAVIRUS, NAA: SARS-CoV-2, NAA: NOT DETECTED

## 2021-12-08 ENCOUNTER — Encounter (HOSPITAL_COMMUNITY): Payer: Self-pay | Admitting: Pulmonary Disease

## 2021-12-08 ENCOUNTER — Telehealth: Payer: Self-pay | Admitting: Pulmonary Disease

## 2021-12-08 ENCOUNTER — Other Ambulatory Visit: Payer: Self-pay

## 2021-12-08 NOTE — Telephone Encounter (Signed)
Called and spoke with the pt  She is c/o nasal congestion- clear nasal d/c  She has had some prod cough with light yellow to clear sputum  Symptoms started 4 days ago and seem to be improving  She has not had fever or aches  No wheezing, SOB She is scheduled for bronch 12/09/21 and wanting to ensure still okay to proceed  She states that she does typically have allergies this time of year and has not been taking any antihistamine  Please advise, thank you!

## 2021-12-08 NOTE — Telephone Encounter (Signed)
Garner Nash, DO to Me  [facility] Triage Pool      12/08/21 10:04 AM   Okay to proceed as long as she does not feel like she is getting worse or having trouble breathing.  Or any other respiratory complaints.  If she is having worsening respiratory complaints then we would have to consider postponing the procedure.  Her COVID swab from 12/05/2021 is negative.   Garner Nash, DO  [facility]  12/08/2021 10:03 AM    Spoke with the pt and notified of response per BI  She verbalized understanding  Nothing further needed

## 2021-12-08 NOTE — Progress Notes (Signed)
Spoke with pt for pre-op call. Pt denies cardiac history. States she had some rapid heart rate at times during menopause. Pt is treated for HTN. Pt states she's had some congestion and increase cough, denies fever or chills. She states she is feeling better today. Covid test was negative on 12-05-21. She has called Dr. Juline Patch office - see his note about it under L. Raskin's name.   Shower instructions given to pt and she voiced understanding.

## 2021-12-09 ENCOUNTER — Encounter (HOSPITAL_COMMUNITY): Payer: Self-pay | Admitting: Certified Registered"

## 2021-12-09 ENCOUNTER — Telehealth: Payer: Self-pay | Admitting: Pulmonary Disease

## 2021-12-09 ENCOUNTER — Ambulatory Visit (HOSPITAL_COMMUNITY): Admission: RE | Admit: 2021-12-09 | Payer: Medicare Other | Source: Home / Self Care | Admitting: Pulmonary Disease

## 2021-12-09 DIAGNOSIS — R911 Solitary pulmonary nodule: Secondary | ICD-10-CM | POA: Insufficient documentation

## 2021-12-09 HISTORY — DX: Tachycardia, unspecified: R00.0

## 2021-12-09 HISTORY — DX: Malignant (primary) neoplasm, unspecified: C80.1

## 2021-12-09 HISTORY — DX: Anemia, unspecified: D64.9

## 2021-12-09 HISTORY — DX: Unspecified osteoarthritis, unspecified site: M19.90

## 2021-12-09 HISTORY — DX: Pneumonia, unspecified organism: J18.9

## 2021-12-09 HISTORY — DX: Essential (primary) hypertension: I10

## 2021-12-09 SURGERY — BRONCHOSCOPY, WITH BIOPSY USING ELECTROMAGNETIC NAVIGATION
Anesthesia: General | Laterality: Right

## 2021-12-09 NOTE — Telephone Encounter (Signed)
Spoke with pt and she states that she already spoke with Carla Lane and Carla Lane has been rescheduled for 12/30/2021 with follow up with Carla Form, NP on 01/06/2022. Nothing further needed at this time.

## 2021-12-16 ENCOUNTER — Ambulatory Visit: Payer: Medicare Other | Admitting: Acute Care

## 2021-12-25 ENCOUNTER — Telehealth: Payer: Self-pay | Admitting: Pulmonary Disease

## 2021-12-25 MED ORDER — PREDNISONE 20 MG PO TABS: 40.0000 mg | ORAL_TABLET | Freq: Every day | ORAL | 0 refills | Status: DC

## 2021-12-25 MED ORDER — ALBUTEROL SULFATE HFA 108 (90 BASE) MCG/ACT IN AERS: 2.0000 | INHALATION_SPRAY | Freq: Four times a day (QID) | RESPIRATORY_TRACT | 6 refills | Status: DC | PRN

## 2021-12-25 MED ORDER — DOXYCYCLINE HYCLATE 100 MG PO TABS: 100.0000 mg | ORAL_TABLET | Freq: Two times a day (BID) | ORAL | 0 refills | Status: DC

## 2021-12-25 NOTE — Telephone Encounter (Signed)
Called and spoke with pt who states she is still coughing and states that she isn't able to sleep at night due to coughing all throughout the night.  States from all the coughing, her throat is very scratchy feeling. Pt has had some mild wheeze.  Pt has been taking benadryl every day when the cough starts to get bad, has been using delsym cough syrup which hasn't really helped. Pt also has been using throat spray to help with her throat.  Pt wants to know what could be recommended to help with her symptoms. Katie, please advise.

## 2021-12-25 NOTE — Telephone Encounter (Signed)
Please send prednisone 40 mg daily for 5 days and doxycycline 100 mg 1 tab Twice daily for 7 days. Take prednisone in AM with food. Use albuterol for SOB or wheezing. Continue maintenance inhaler. She can try chlorpheniramine 4 mg tabs q 6 hr OTC instead of benadryl for the cough. Continue delsym. Needs to be using mucinex [phone] mg Twice daily to help keep secretions thin. Needs to keep OV on 10/24 with Sarah to ensure she is better and get her bronch rescheduled. Thanks.

## 2021-12-25 NOTE — Telephone Encounter (Signed)
Called and spoke with pt letting her know the recs per [facility] and she verbalized understanding. Meds have been sent to pharmacy for pt. When stated to her about the inhalers, pt stated that she did not have any inhalers so per Joellen Jersey, send Rx for albuterol inhaler to pharmacy for pt to have in case if needed. Made pt aware of this also being sent in. Nothing further needed.

## 2021-12-25 NOTE — Telephone Encounter (Signed)
Pt has bronch scheduled for 10/17 and she states she still has cough, no longer coughing up blood, but still cough.  Wants to reschedule bronch til she is feeling better.  I placed triage message for her cough but when would you like to reschedule bronch?

## 2021-12-26 ENCOUNTER — Encounter (HOSPITAL_COMMUNITY): Payer: Self-pay | Admitting: Pulmonary Disease

## 2021-12-29 ENCOUNTER — Encounter (HOSPITAL_COMMUNITY): Payer: Self-pay | Admitting: Pulmonary Disease

## 2021-12-29 NOTE — Progress Notes (Signed)
I called patient who stated that she is cancelled this 12/30/21 procedure with Dr Valeta Harms.  (See TE on 12/25/21).  PCP - Dr Lucianne Lei Cardiologist - n/a  CT Chest x-ray - 12/05/21 EKG - n/a Stress Test - n/a ECHO - n/a Cardiac Cath - n/a  ICD Pacemaker/Loop - n/a  Sleep Study -  n/a CPAP - none

## 2021-12-29 NOTE — Telephone Encounter (Signed)
Per BI can cancel bronch and pt has appt with SG next week and will discuss rescheduling.  Nothing further needed.

## 2021-12-30 ENCOUNTER — Ambulatory Visit (HOSPITAL_COMMUNITY): Admission: RE | Admit: 2021-12-30 | Payer: Medicare Other | Source: Home / Self Care | Admitting: Pulmonary Disease

## 2021-12-30 HISTORY — DX: Dyspnea, unspecified: R06.00

## 2021-12-30 HISTORY — DX: Depression, unspecified: F32.A

## 2021-12-30 HISTORY — DX: Anxiety disorder, unspecified: F41.9

## 2021-12-30 SURGERY — BRONCHOSCOPY, WITH BIOPSY USING ELECTROMAGNETIC NAVIGATION
Anesthesia: General | Laterality: Right

## 2022-01-05 DIAGNOSIS — M62830 Muscle spasm of back: Secondary | ICD-10-CM | POA: Diagnosis not present

## 2022-01-05 DIAGNOSIS — M9905 Segmental and somatic dysfunction of pelvic region: Secondary | ICD-10-CM | POA: Diagnosis not present

## 2022-01-05 DIAGNOSIS — M9903 Segmental and somatic dysfunction of lumbar region: Secondary | ICD-10-CM | POA: Diagnosis not present

## 2022-01-05 DIAGNOSIS — M9902 Segmental and somatic dysfunction of thoracic region: Secondary | ICD-10-CM | POA: Diagnosis not present

## 2022-01-06 ENCOUNTER — Ambulatory Visit (INDEPENDENT_AMBULATORY_CARE_PROVIDER_SITE_OTHER): Payer: Medicare Other | Admitting: Acute Care

## 2022-01-06 ENCOUNTER — Encounter: Payer: Self-pay | Admitting: Acute Care

## 2022-01-06 ENCOUNTER — Ambulatory Visit (INDEPENDENT_AMBULATORY_CARE_PROVIDER_SITE_OTHER): Payer: Medicare Other

## 2022-01-06 ENCOUNTER — Other Ambulatory Visit: Payer: Self-pay

## 2022-01-06 VITALS — BP 122/82 | HR 81 | Ht 63.5 in | Wt 121.6 lb

## 2022-01-06 DIAGNOSIS — R918 Other nonspecific abnormal finding of lung field: Secondary | ICD-10-CM | POA: Diagnosis not present

## 2022-01-06 DIAGNOSIS — J989 Respiratory disorder, unspecified: Secondary | ICD-10-CM

## 2022-01-06 DIAGNOSIS — F17210 Nicotine dependence, cigarettes, uncomplicated: Secondary | ICD-10-CM

## 2022-01-06 DIAGNOSIS — J069 Acute upper respiratory infection, unspecified: Secondary | ICD-10-CM

## 2022-01-06 DIAGNOSIS — M419 Scoliosis, unspecified: Secondary | ICD-10-CM | POA: Diagnosis not present

## 2022-01-06 NOTE — Patient Instructions (Addendum)
It is good to see you today.  We need to get your Bronch scheduled, but you need to be over this illness . We will do a CXR today to better evaluate your lungs. >> this shows no acute process We will call you with the results Please Covid test when you get home.  Call if it is positive We will do a follow up visit in 2 weeks to see if you are any better.  We will schedule your bronchoscopy with biopsies when you are better. Flu vaccine when you are better.  Please contact office for sooner follow up if symptoms do not improve or worsen or seek emergency care

## 2022-01-06 NOTE — Progress Notes (Signed)
History of Present Illness Carla Lane is a 71 y.o. female current every day smoker  ( 60 + pack year smoking history) followed through the screening program.  09/04/2021 LDCT was positive for a RLL pulmonary nodule. She was referred to Dr. Valeta Harms for further evaluation.   Synopsis Pt. Has been scheduled for 2 Flexible video fiberoptic bronchoscopy with robotic assistance and biopsies x 2 . She has been sick since 1 day after her CT scan on 12/08/2021, and has had to cancel the procedure twice..   01/06/2022 Patient presents for follow-up. She states she has been sick since she had her PET and CT Chest in September . She has been treated with antibiotics x 2. First she was treated with Amoxicillin , that she self prescribed , medication she had at home, and took  x 8 days. . She got better , then got sick again when she stopped the antibiotic. She was treated a second time with Doxycycline by and prednisone by Dr. Valeta Harms starting on 12/25/2021 .  She completed this treatment.  She continues to have a cough . She is coughing up yellow secretions when she first gets up in the morning but then they are more white to clear as the day progresses.  She is coughing for throat clearing. She is not using anything for cough. She completed her Doxycycline as prescribed. She continues to have cold like symptoms. She has been Covid tested prior to her bronch appointments, but has not tested since. First day of symptoms 12/06/2021.  We need to get her bronch re-scheduled, but she needs to be well. She continues to be symptomatic with cough, and runny nose. She had some hemoptysis at the earliest stage of her illness starting 12/07/2021. She was coughing up blood x 3 days, then it cleared. She does want the flu vaccine, but she is sick, so we will wait until we get her over this current illness.. She does not feel this is allergies. She has had fever, up to 104 in the early phases of her illness , and of 100  F a few days  ago. She states she only feels better when she is on the antibiotics.  Chest x-ray done today in the office was negative for any acute cardiopulmonary disease.  Pleural-based mass in the right lower lobe was evident and better demonstrated on prior PET/CT.  Suspect this is a slow to resolve COPD exacerbation.  She is still continuing to smoke.,  She states 1 pack/day.  Smoking cessation counseling was done.  I had a long discussion with the patient regarding the need for bronchoscopy and biopsy of her right lower lobe hypermetabolic pulmonary mass.  She had a lot of questions about the safety of robotic bronchoscopy.  She said she had more concerns over how safe the procedure was.  I explained to her that this is a very safe procedure and that Dr. Valeta Harms does these with enough frequency to maintain excellent skill.  I explained that watchful waiting with a nodule that is hypermetabolic is not a good plan.  We reviewed the mass on CT and PET scan.  She understands she needs to get this procedure done as soon as she is well.  Test Results: Chest x-ray 01/06/2022 No interlobular septal thickening or pulmonary vascular congestion.   No pneumothorax or pleural effusion.  No confluent airspace disease.   The pleural based mass of the right lower lobe better demonstrated on prior CT.   Degenerative  changes the spine. Scoliotic curvature. No displaced fracture   IMPRESSION: Negative for acute cardiopulmonary disease   Pleural base mass of the right lower lobe better demonstrated on prior CT/PET CT    PET scan 10/08/3662 Hypermetabolic right lower lobe 2.6 cm pulmonary nodule, most consistent with primary bronchogenic carcinoma. No evidence of thoracic nodal or distant metastatic disease. Presuming non-small-cell histology, T1cN0M0 or stage IA. 2. Incidental findings, including: Aortic atherosclerosis (ICD10-I70.0) and emphysema (ICD10-J43.9). Bilateral adrenal adenomas.  Super D CT Chest  2.9  x 1.9 x 3.8 cm mass in the medial right lower lobe, previously hypermetabolic on PET-CT 40/34/7425, highly concerning for primary bronchogenic carcinoma. 2. No definite lymphadenopathy. 3. Diffuse bronchial wall thickening with moderate centrilobular and paraseptal emphysema; imaging findings suggestive of underlying COPD. 4. Aortic atherosclerosis, in addition to left anterior descending coronary artery disease. Assessment for potential risk factor modification, dietary therapy or pharmacologic therapy may be warranted, if clinically indicated. 5. Bilateral adrenal adenomas.     Latest Ref Rng & Units 12/15/2015   12:17 AM  CBC  WBC 4.0 - 10.5 K/uL 12.4   Hemoglobin 12.0 - 15.0 g/dL 14.9   Hematocrit 36.0 - 46.0 % 44.7   Platelets 150 - 400 K/uL 284        Latest Ref Rng & Units 12/15/2015   12:17 AM  BMP  Glucose 65 - 99 mg/dL 123   BUN 6 - 20 mg/dL 16   Creatinine 0.44 - 1.00 mg/dL 1.27   Sodium 135 - 145 mmol/L 137   Potassium 3.5 - 5.1 mmol/L 3.6   Chloride 101 - 111 mmol/L 102   CO2 22 - 32 mmol/L 26   Calcium 8.9 - 10.3 mg/dL 9.5     BNP No results found for: "BNP"  ProBNP No results found for: "PROBNP"  PFT No results found for: "FEV1PRE", "FEV1POST", "FVCPRE", "FVCPOST", "TLC", "DLCOUNC", "PREFEV1FVCRT", "PSTFEV1FVCRT"  DG Chest 2 View  Result Date: 01/06/2022 CLINICAL DATA:  71 year old female with upper respiratory illness. Known mass of the right lung EXAM: CHEST - 2 VIEW COMPARISON:  CT 12/05/2021 FINDINGS: Cardiomediastinal silhouette within normal limits. No interlobular septal thickening or pulmonary vascular congestion. No pneumothorax or pleural effusion.  No confluent airspace disease. The pleural based mass of the right lower lobe better demonstrated on prior CT. Degenerative changes the spine. Scoliotic curvature. No displaced fracture IMPRESSION: Negative for acute cardiopulmonary disease Pleural base mass of the right lower lobe better  demonstrated on prior CT/PET CT Electronically Signed   By: Corrie Mckusick D.O.   On: 01/06/2022 11:58     Past medical hx Past Medical History:  Diagnosis Date   Anemia    during menstruating years   Anxiety    Arthritis    Cancer ([place])    squamous (tip of nose) Basal - top of shoulder, top of each ears and top of forehead   Cataracts, bilateral 05/2017   Colon polyps    Depression    Dyspnea 11/2021   Hypertension    does not routinely take her blood pressure medications every day.   Pneumonia    Primary open angle glaucoma 10/2015   Bilateral   Rapid heart rate    during pre-menopause - thinks it was due to a HTN medication   Restless leg syndrome 07/16/2020   Right lower lobe lung mass 09/04/2021     Social History   Tobacco Use   Smoking status: Every Day    Packs/day: 1.00  Years: 60.00    Total pack years: 60.00    Types: Cigarettes   Smokeless tobacco: Never   Tobacco comments:    Smoking 7 packs of cigarettes a week. 11/26/2021 [place]  Vaping Use   Vaping Use: Former   Devices: Did a trial study for vapes  Substance Use Topics   Alcohol use: Yes    Comment: occasional   Drug use: Never    Ms.Massimino reports that she has been smoking cigarettes. She has a 60.00 pack-year smoking history. She has never used smokeless tobacco. She reports current alcohol use. She reports that she does not use drugs.  Tobacco Cessation: Current everyday smoker smokes 1 pack/day, 60-pack-year smoking history Past surgical hx, Family hx, Social hx all reviewed.  Current Outpatient Medications on File Prior to Visit  Medication Sig   albuterol (VENTOLIN HFA) 108 (90 Base) MCG/ACT inhaler Inhale 2 puffs into the lungs every 6 (six) hours as needed for wheezing or shortness of breath.   COLLAGEN PO Take 1 Scoop by mouth 2 (two) times a week.   diphenhydrAMINE (BENADRYL) 25 MG tablet Take 25 mg by mouth daily.   latanoprost (XALATAN) 0.005 % ophthalmic solution Place 1 drop into  both eyes at bedtime.   Multiple Vitamins-Minerals (EMERGEN-C IMMUNE PLUS) PACK Take 1 packet by mouth daily.   sacubitril-valsartan (ENTRESTO) 49-51 MG Take 1 tablet by mouth 2 (two) times daily.   Vitamin D, Ergocalciferol, (DRISDOL) 1.25 MG (50000 UNIT) CAPS capsule Take 50,000 Units by mouth every Wednesday.   doxycycline (VIBRA-TABS) 100 MG tablet Take 1 tablet (100 mg total) by mouth 2 (two) times daily. (Patient not taking: Reported on 01/06/2022)   predniSONE (DELTASONE) 20 MG tablet Take 2 tablets (40 mg total) by mouth daily with breakfast.   No current facility-administered medications on file prior to visit.     Allergies  Allergen Reactions   Codeine Other (See Comments)    White spots on throat, Looks like strep throat (allergic reaction)    Review Of Systems:  Constitutional:   No  weight loss, night sweats,   +Fevers, chills, +fatigue, or  lassitude.  HEENT:   No headaches,  Difficulty swallowing,  Tooth/dental problems,    +Sore throat, No sneezing, itching, ear ache, +nasal congestion,+post nasal drip, + cough  CV:  No chest pain,  Orthopnea, PND, swelling in lower extremities, anasarca, dizziness, palpitations, syncope.   GI  No heartburn, indigestion, abdominal pain, nausea, vomiting, diarrhea, change in bowel habits, loss of appetite, bloody stools.   Resp: + shortness of breath with exertion less at rest.  + excess mucus, + productive cough,  No non-productive cough,  No coughing up of blood.  No change in color of mucus.  No wheezing.  No chest wall deformity  Skin: no rash or lesions.  GU: no dysuria, change in color of urine, no urgency or frequency.  No flank pain, no hematuria   MS:  No joint pain or swelling.  No decreased range of motion.  No back pain.  Psych:  No change in mood or affect. No depression or anxiety.  No memory loss.   Vital Signs BP 122/82 (BP Location: Left Arm, Cuff Size: Normal)   Pulse 81   Ht 5' 3.5" (1.613 m)   Wt 121 lb  9.6 oz (55.2 kg)   SpO2 99%   BMI 21.20 kg/m    Physical Exam:  General- No distress,  A&Ox3, pleasant, but clearly does not feel well ENT: No  sinus tenderness, TM clear, pale nasal mucosa,  + oral exudate,+ post nasal drip, no LAN Cardiac: S1, S2, regular rate and rhythm, no murmur Chest: No wheeze/ rales/ dullness; no accessory muscle use, no nasal flaring, no sternal retractions, Diminished per bases with rhonchi throughout Abd.: Soft Non-tender, ND, BS +, Body mass index is 21.2 kg/m.  Ext: No clubbing cyanosis, edema Neuro:  normal strength, moving all extremities x4, alert and oriented x3, appropriate Skin: No rashes, warm and dry, pale Psych: Anxious, having a hard time staying focused and on point   Assessment/Plan Right lower lobe pleural-based mass PET avid with SUV max of 6 Robotic bronchoscopy with biopsies has been canceled x2 for upper respiratory illness/COPD flare Chest x-ray done today with no acute cardiopulmonary issues Plan We need to get your Bronch and biopsies scheduled, but you need to improve medically before we can do this. Follow-up in 2 weeks to see if you is well enough to schedule bronch and biopsies Watchful waiting is not a good option at this point, will need biopsy, tissue diagnosis and then based on these results we will determine best plan of care  Slow to resolve bronchitis/COPD flare Treated x2 with antibiotics in the last month once with amoxicillin and then followed by doxycycline and a prednisone taper Plan Please COVID test when you get home and call me if it is positive Continue symptomatic relief Chest x-ray is clear for any acute cardiopulmonary issues. We will schedule your bronchoscopy with biopsies when you are better We will administer flu vaccine when you are better    Moderate central lobar and paraseptal emphysema Currently not on maintenance medication Using albuterol as needed for shortness of breath and  wheezing Plan Patient is currently not on any maintenance therapy. Once patient is better we need pulmonary function tests to evaluate severity of obstructive disease, and diffusion capacity, and start appropriate maintenance therapy.  Current everyday smoker Plan Smoking cessation counseling has been done with the patient..   I spent 45 minutes dedicated to the care of this patient on the date of this encounter to include pre-visit review of records, face-to-face time with the patient discussing conditions above, post visit ordering of testing, clinical documentation with the electronic health record, making appropriate referrals as documented, and communicating necessary information to the patient's healthcare team.   Magdalen Spatz, NP 01/06/2022  7:12 PM

## 2022-01-07 ENCOUNTER — Telehealth: Payer: Self-pay | Admitting: Acute Care

## 2022-01-07 NOTE — Telephone Encounter (Signed)
Called and spoke with patient. She verbalized understanding.   Nothing further needed.  

## 2022-01-07 NOTE — Telephone Encounter (Signed)
Called and spoke with patient. She stated that her husband tested positive for COVID this morning. She tested herself today and her results were negative. She denied have any new symptoms since her OV yesterday with Sarah. She still has the head congestion.   She wanted to know if Judson Roch know. Will route to Judson Roch so she is aware.

## 2022-01-07 NOTE — Telephone Encounter (Signed)
-----   Message from Magdalen Spatz, NP sent at 01/06/2022  7:26 PM EDT ----- Please call patient and let her know her chest x-ray that I did in the office on 1024 showed no acute pulmonary disease. She needs to continue with symptomatic treatment of her cold-like symptoms. Please ask her to call for fever, and ensure she did her COVID test today and please inquire about the results.  I had asked her to call me and let me know if her results were positive. Let her know that at follow-up in 2 weeks we will evaluate if she is ready for [place] to schedule her bronc and biopsies. Thank you so much Barnet Pall.

## 2022-01-07 NOTE — Telephone Encounter (Signed)
See other note. Nothing further needed,

## 2022-01-20 ENCOUNTER — Telehealth: Payer: Self-pay | Admitting: Acute Care

## 2022-01-20 NOTE — Telephone Encounter (Signed)
Called and left voicemail for patient to call office back. Need to know how she is feeling before we advise her about flu shot.

## 2022-01-21 ENCOUNTER — Ambulatory Visit: Payer: Medicare Other | Admitting: Pulmonary Disease

## 2022-01-21 NOTE — Telephone Encounter (Signed)
She still has a cough with mucous, clear to white.  This is back to her baseline.  She feels like she may have some infection in her ears.  Her ears feel stopped up.  Has some congestion in her ears.  She has felt them clear at times. She is feeling much better.  After she has had some rest, her ears clear up.  By the time the day wears on, her ears get stopped up.  Advised her that she can get her flu shot when she is feeling completely better, not feeling ill at all, no fever, chills or body aches.  She verbalized understanding.  Nothing further needed.

## 2022-01-21 NOTE — Telephone Encounter (Signed)
Patient is returning phone call. Patient phone number is [phone].

## 2022-02-04 ENCOUNTER — Telehealth: Payer: Self-pay | Admitting: Pulmonary Disease

## 2022-02-04 NOTE — Telephone Encounter (Signed)
PCCM:  Patient is seeing me on 12/4  She had to have her previous bronchoscopy cancelled.   I have called and left her a message.   I would like to speak with her on the phone potentially one day next week.   I would like to schedule her bronchoscopy for 02/17/2022 if she is willing and keep her appt on the 4th with me to discuss any questions she may have  Thanks  [place], DO [facility] 02/04/2022 4:02 PM

## 2022-02-11 NOTE — Telephone Encounter (Signed)
I spoke to pt & she agreed to have bronch on 12/5.  I have her scheduled and gave her appt info. She will come to office on 12/4 for her ov.

## 2022-02-13 ENCOUNTER — Other Ambulatory Visit: Payer: Medicare Other

## 2022-02-13 ENCOUNTER — Other Ambulatory Visit: Payer: Self-pay | Admitting: *Deleted

## 2022-02-13 DIAGNOSIS — Z01812 Encounter for preprocedural laboratory examination: Secondary | ICD-10-CM

## 2022-02-15 LAB — NOVEL CORONAVIRUS, NAA: SARS-CoV-2, NAA: NOT DETECTED

## 2022-02-16 ENCOUNTER — Other Ambulatory Visit: Payer: Self-pay

## 2022-02-16 ENCOUNTER — Encounter: Payer: Self-pay | Admitting: Pulmonary Disease

## 2022-02-16 ENCOUNTER — Encounter (HOSPITAL_COMMUNITY): Payer: Self-pay | Admitting: Pulmonary Disease

## 2022-02-16 ENCOUNTER — Ambulatory Visit (INDEPENDENT_AMBULATORY_CARE_PROVIDER_SITE_OTHER): Payer: Medicare Other | Admitting: Pulmonary Disease

## 2022-02-16 VITALS — BP 118/68 | HR 79 | Temp 98.0°F | Ht 64.0 in | Wt 121.2 lb

## 2022-02-16 DIAGNOSIS — F17200 Nicotine dependence, unspecified, uncomplicated: Secondary | ICD-10-CM

## 2022-02-16 DIAGNOSIS — R911 Solitary pulmonary nodule: Secondary | ICD-10-CM

## 2022-02-16 DIAGNOSIS — R918 Other nonspecific abnormal finding of lung field: Secondary | ICD-10-CM

## 2022-02-16 NOTE — Patient Instructions (Signed)
Thank you for visiting Dr. Valeta Harms at [facility]. Today we recommend the following:  Bronchoscopy on Tomorrow   Return in about 8 days (around 02/24/2022) for w/ Eric Form, NP .    Please do your part to reduce the spread of COVID-19.

## 2022-02-16 NOTE — Progress Notes (Signed)
Synopsis: Referred in September 2023 for lung mass by Lucianne Lei, MD  Subjective:   PATIENT ID: Carla Lane: female DOB: 1951-03-02, MRN: [id number]  Chief Complaint  Patient presents with   Follow-up    Scan review     This is a 71 year old female, past medical history of hypertension, current every day smoker.Patient had a lung cancer screening CT on 09/04/2021 which was found to have a right lower lobe 26.5 mm lung nodule.  She was referred for further evaluation.  Patient has no respiratory complaints today.  She is retired from [employer].  Has lived all over the country.  She has a longstanding history of smoking for the last 60 years.  She states that she is intermittently compliant with her blood pressure medications.  She does not routinely take her blood pressure medications every day.  I told her that it is important for her to follow-up with her primary care doctor to discuss this.  OV 02/16/2022: Patient was initially seen in the office after having abnormal lung cancer screening CT imaging.  She was scheduled for bronchoscopy unfortunately she became sick her husband consent became sick we had to reschedule her bronchoscopy.  She still was not feeling better after several weeks we rescheduled her bronchoscopy for a second time.  She followed up in the office.  Decision was made to undergo procedure on 02/17/2022.  She is here today to discuss any questions that she may have regarding planned bronchoscopy since it has been several weeks due to rescheduling.    Past Medical History:  Diagnosis Date   Anemia    during menstruating years   Anxiety    Arthritis    Cancer ([place])    squamous (tip of nose) Basal - top of shoulder, top of each ears and top of forehead   Cataracts, bilateral 05/2017   Colon polyps    Depression    Dyspnea 11/2021   Hypertension    does not routinely take her blood pressure medications every day.   Pneumonia    Primary open angle  glaucoma 10/2015   Bilateral   Rapid heart rate    during pre-menopause - thinks it was due to a HTN medication   Restless leg syndrome 07/16/2020   Right lower lobe lung mass 09/04/2021     No family history on file.   Past Surgical History:  Procedure Laterality Date   APPENDECTOMY     COLONOSCOPY     DENTAL SURGERY     LAPAROSCOPIC SALPINGO OOPHERECTOMY     LINGUAL FRENECTOMY     at 9 years    Social History   Socioeconomic History   Marital status: Married    Spouse name: Not on file   Number of children: Not on file   Years of education: Not on file   Highest education level: Not on file  Occupational History   Not on file  Tobacco Use   Smoking status: Every Day    Packs/day: 1.00    Years: 60.00    Total pack years: 60.00    Types: Cigarettes   Smokeless tobacco: Never   Tobacco comments:    Smoking 10 packs of cigarettes a day. ARJ 02/16/22  Vaping Use   Vaping Use: Former   Devices: Did a trial study for vapes  Substance and Sexual Activity   Alcohol use: Yes    Comment: occasional   Drug use: Never   Sexual activity: Not Currently  Birth control/protection: Post-menopausal  Other Topics Concern   Not on file  Social History Narrative   Not on file   Social Determinants of Health   Financial Resource Strain: Not on file  Food Insecurity: Not on file  Transportation Needs: Not on file  Physical Activity: Not on file  Stress: Not on file  Social Connections: Not on file  Intimate Partner Violence: Not on file     Allergies  Allergen Reactions   Codeine Other (See Comments)    White spots on throat, Looks like strep throat (allergic reaction)     Outpatient Medications Prior to Visit  Medication Sig Dispense Refill   albuterol (VENTOLIN HFA) 108 (90 Base) MCG/ACT inhaler Inhale 2 puffs into the lungs every 6 (six) hours as needed for wheezing or shortness of breath. 8 g 6   aspirin EC 325 MG tablet Take 325 mg by mouth every 6 (six)  hours as needed for moderate pain.     COLLAGEN PO Take 1 Scoop by mouth 3 (three) times a week.     doxycycline (VIBRA-TABS) 100 MG tablet Take 1 tablet (100 mg total) by mouth 2 (two) times daily. 14 tablet 0   Iodine TINC Apply 1 Application topically 2 (two) times a week.     latanoprost (XALATAN) 0.005 % ophthalmic solution Place 1 drop into both eyes at bedtime.     Multiple Vitamins-Minerals (EMERGEN-C IMMUNE PLUS) PACK Take 1 packet by mouth daily.     nicotine (NICODERM CQ - DOSED IN MG/24 HOURS) 14 mg/24hr patch Place 14 mg onto the skin daily as needed (nicotine dependence).     Omega-3 Fatty Acids (SALMON OIL PO) Take 1 capsule by mouth 3 (three) times a week.     phenol (CHLORASEPTIC) 1.4 % LIQD Use as directed 2 sprays in the mouth or throat as needed for throat irritation / pain.     predniSONE (DELTASONE) 20 MG tablet Take 2 tablets (40 mg total) by mouth daily with breakfast. 10 tablet 0   sacubitril-valsartan (ENTRESTO) 49-51 MG Take 0.5 tablets by mouth daily.     Vitamin D, Ergocalciferol, (DRISDOL) 1.25 MG (50000 UNIT) CAPS capsule Take 50,000 Units by mouth every Monday.     No facility-administered medications prior to visit.    Review of Systems  Constitutional:  Negative for chills, fever, malaise/fatigue and weight loss.  HENT:  Negative for hearing loss, sore throat and tinnitus.   Eyes:  Negative for blurred vision and double vision.  Respiratory:  Positive for hemoptysis and shortness of breath. Negative for cough, sputum production, wheezing and stridor.   Cardiovascular:  Negative for chest pain, palpitations, orthopnea, leg swelling and PND.  Gastrointestinal:  Negative for abdominal pain, constipation, diarrhea, heartburn, nausea and vomiting.  Genitourinary:  Negative for dysuria, hematuria and urgency.  Musculoskeletal:  Negative for joint pain and myalgias.  Skin:  Negative for itching and rash.  Neurological:  Negative for dizziness, tingling, weakness  and headaches.  Endo/Heme/Allergies:  Negative for environmental allergies. Does not bruise/bleed easily.  Psychiatric/Behavioral:  Negative for depression. The patient is not nervous/anxious and does not have insomnia.   All other systems reviewed and are negative.    Objective:  Physical Exam Vitals reviewed.  Constitutional:      General: She is not in acute distress.    Appearance: She is well-developed.  HENT:     Head: Normocephalic and atraumatic.     Mouth/Throat:     Pharynx: No oropharyngeal exudate.  Eyes:     Conjunctiva/sclera: Conjunctivae normal.     Pupils: Pupils are equal, round, and reactive to light.  Neck:     Vascular: No JVD.     Trachea: No tracheal deviation.     Comments: Loss of supraclavicular fat Cardiovascular:     Rate and Rhythm: Normal rate and regular rhythm.     Heart sounds: S1 normal and S2 normal.     Comments: Distant heart tones Pulmonary:     Effort: No tachypnea or accessory muscle usage.     Breath sounds: No stridor. Decreased breath sounds (throughout all lung fields) present. No wheezing, rhonchi or rales.  Abdominal:     General: Bowel sounds are normal. There is no distension.     Palpations: Abdomen is soft.     Tenderness: There is no abdominal tenderness.  Musculoskeletal:        General: Deformity (muscle wasting ) present.  Skin:    General: Skin is warm and dry.     Capillary Refill: Capillary refill takes less than 2 seconds.     Findings: No rash.  Neurological:     Mental Status: She is alert and oriented to person, place, and time.  Psychiatric:        Behavior: Behavior normal.      Vitals:   02/16/22 1106  BP: 118/68  Pulse: 79  Temp: 98 F (36.7 C)  TempSrc: Oral  SpO2: 99%  Weight: 121 lb 3.2 oz (55 kg)  Height: 5\' 4"  (1.626 m)   99% on RA BMI Readings from Last 3 Encounters:  02/16/22 20.80 kg/m  01/06/22 21.20 kg/m  11/26/21 21.03 kg/m   Wt Readings from Last 3 Encounters:  02/16/22  121 lb 3.2 oz (55 kg)  01/06/22 121 lb 9.6 oz (55.2 kg)  11/26/21 120 lb 9.6 oz (54.7 kg)     CBC    Component Value Date/Time   WBC 12.4 (H) 12/15/2015 0017   RBC 4.92 12/15/2015 0017   HGB 14.9 12/15/2015 0017   HCT 44.7 12/15/2015 0017   PLT 284 12/15/2015 0017   MCV 90.9 12/15/2015 0017   MCH 30.3 12/15/2015 0017   MCHC 33.3 12/15/2015 0017   RDW 13.5 12/15/2015 0017     Chest Imaging: LDCT June: 33mm RLL lung nodule Concerning for malignancy The patient's images have been independently reviewed by me.    Nuclear medicine PET scan 0/11/3816: Hypermetabolic lesion 2.6 cm pulmonary nodule concerning for primary bronchogenic carcinoma. The patient's images have been independently reviewed by me.    Pulmonary Functions Testing Results:     No data to display          FeNO:   Pathology:   Echocardiogram:   Heart Catheterization:     Assessment & Plan:     ICD-10-CM   1. Pulmonary mass  R91.8     2. Lung nodule  R91.1     3. Current smoker  F17.200       Discussion:  This is a 71 year old female longstanding history of tobacco use abnormal CT imaging with lung nodule concerning for primary bronchogenic carcinoma.  She has had some hemoptysis since last seen.  Her procedure has been rescheduled multiple times due to patient's illness.  She is doing better.  Plan: She has recovered now approximately 6 weeks from her respiratory illness.  She is willing to undergo procedure.  Plan reschedule bronchoscopy on 02/17/2022. We talked about the risk of bleeding and pneumothorax today  in the office. We will give her a new letter today with all of description of details for her procedure tomorrow.  We will see her tomorrow and endoscopy.   Current Outpatient Medications:    albuterol (VENTOLIN HFA) 108 (90 Base) MCG/ACT inhaler, Inhale 2 puffs into the lungs every 6 (six) hours as needed for wheezing or shortness of breath., Disp: 8 g, Rfl: 6   aspirin EC 325  MG tablet, Take 325 mg by mouth every 6 (six) hours as needed for moderate pain., Disp: , Rfl:    COLLAGEN PO, Take 1 Scoop by mouth 3 (three) times a week., Disp: , Rfl:    doxycycline (VIBRA-TABS) 100 MG tablet, Take 1 tablet (100 mg total) by mouth 2 (two) times daily., Disp: 14 tablet, Rfl: 0   Iodine TINC, Apply 1 Application topically 2 (two) times a week., Disp: , Rfl:    latanoprost (XALATAN) 0.005 % ophthalmic solution, Place 1 drop into both eyes at bedtime., Disp: , Rfl:    Multiple Vitamins-Minerals (EMERGEN-C IMMUNE PLUS) PACK, Take 1 packet by mouth daily., Disp: , Rfl:    nicotine (NICODERM CQ - DOSED IN MG/24 HOURS) 14 mg/24hr patch, Place 14 mg onto the skin daily as needed (nicotine dependence)., Disp: , Rfl:    Omega-3 Fatty Acids (SALMON OIL PO), Take 1 capsule by mouth 3 (three) times a week., Disp: , Rfl:    phenol (CHLORASEPTIC) 1.4 % LIQD, Use as directed 2 sprays in the mouth or throat as needed for throat irritation / pain., Disp: , Rfl:    predniSONE (DELTASONE) 20 MG tablet, Take 2 tablets (40 mg total) by mouth daily with breakfast., Disp: 10 tablet, Rfl: 0   sacubitril-valsartan (ENTRESTO) 49-51 MG, Take 0.5 tablets by mouth daily., Disp: , Rfl:    Vitamin D, Ergocalciferol, (DRISDOL) 1.25 MG (50000 UNIT) CAPS capsule, Take 50,000 Units by mouth every Monday., Disp: , Rfl:     Garner Nash, DO [facility] 02/16/2022 11:39 AM

## 2022-02-16 NOTE — Progress Notes (Signed)
PCP - Lucianne Lei, MD  Chest x-ray - 01/06/22 EKG - DOS  ERAS Protcol - NPO  COVID TEST- N on 02/13/22  Anesthesia review: N  Patient verbally denies any shortness of breath, fever, cough and chest pain during phone call   -------------  SDW INSTRUCTIONS given:  Your procedure is scheduled on 02/17/22.  Report to [facility] Main Entrance "A" at [place] and check in at the Admitting office.  Call this number if you have problems the morning of surgery:  [phone]   Remember:  Do not eat or drink after midnight the night before your surgery     Take these medicines the morning of surgery with A SIP OF WATER  N/A  As of today, STOP taking any Aspirin (unless otherwise instructed by your surgeon) Aleve, Naproxen, Ibuprofen, Motrin, Advil, Goody's, BC's, all herbal medications, fish oil, and all vitamins.                      Do not wear jewelry, make up, or nail polish            Do not wear lotions, powders, perfumes/colognes, or deodorant.            Do not shave 48 hours prior to surgery.  Men may shave face and neck.            Do not bring valuables to the hospital.            [facility] is not responsible for any belongings or valuables.  Do NOT Smoke (Tobacco/Vaping) 24 hours prior to your procedure If you use a CPAP at night, you may bring all equipment for your overnight stay.   Contacts, glasses, dentures or bridgework may not be worn into surgery.      For patients admitted to the hospital, discharge time will be determined by your treatment team.   Patients discharged the day of surgery will not be allowed to drive home, and someone needs to stay with them for 24 hours.    Special instructions:   [facility]- Preparing For Surgery  Before surgery, you can play an important role. Because skin is not sterile, your skin needs to be as free of germs as possible. You can reduce the number of germs on your skin by washing with CHG (chlorahexidine gluconate)  Soap before surgery.  CHG is an antiseptic cleaner which kills germs and bonds with the skin to continue killing germs even after washing.    Oral Hygiene is also important to reduce your risk of infection.  Remember - BRUSH YOUR TEETH THE MORNING OF SURGERY WITH YOUR REGULAR TOOTHPASTE  Please do not use if you have an allergy to CHG or antibacterial soaps. If your skin becomes reddened/irritated stop using the CHG.  Do not shave (including legs and underarms) for at least 48 hours prior to first CHG shower. It is OK to shave your face.  Please follow these instructions carefully.   Shower the NIGHT BEFORE SURGERY and the MORNING OF SURGERY with DIAL Soap.   Pat yourself dry with a CLEAN TOWEL.  Wear CLEAN PAJAMAS to bed the night before surgery  Place CLEAN SHEETS on your bed the night of your first shower and DO NOT SLEEP WITH PETS.   Day of Surgery: Please shower morning of surgery  Wear Clean/Comfortable clothing the morning of surgery Do not apply any deodorants/lotions.   Remember to brush your teeth WITH YOUR REGULAR TOOTHPASTE.  Questions were answered. Patient verbalized understanding of instructions.

## 2022-02-16 NOTE — H&P (View-Only) (Signed)
Synopsis: Referred in September 2023 for lung mass by Lucianne Lei, MD  Subjective:   PATIENT ID: Carla Lane, Carla Lane  Chief Complaint  Patient presents with   Follow-up    Scan review     This is a 71 year old female, past medical history of hypertension, current every day smoker.Patient had a lung cancer screening CT on 09/04/2021 which was found to have a right lower lobe 26.5 mm lung nodule.  She was referred for further evaluation.  Patient has no respiratory complaints today.  She is retired from [employer].  Has lived all over the country.  She has a longstanding history of smoking for the last 60 years.  She states that she is intermittently compliant with her blood pressure medications.  She does not routinely take her blood pressure medications every day.  I told her that it is important for her to follow-up with her primary care doctor to discuss this.  OV 02/16/2022: Patient was initially seen in the office after having abnormal lung cancer screening CT imaging.  She was scheduled for bronchoscopy unfortunately she became sick her husband consent became sick we had to reschedule her bronchoscopy.  She still was not feeling better after several weeks we rescheduled her bronchoscopy for a second time.  She followed up in the office.  Decision was made to undergo procedure on 02/17/2022.  She is here today to discuss any questions that she may have regarding planned bronchoscopy since it has been several weeks due to rescheduling.    Past Medical History:  Diagnosis Date   Anemia    during menstruating years   Anxiety    Arthritis    Cancer ([place])    squamous (tip of nose) Basal - top of shoulder, top of each ears and top of forehead   Cataracts, bilateral 05/2017   Colon polyps    Depression    Dyspnea 11/2021   Hypertension    does not routinely take her blood pressure medications every day.   Pneumonia    Primary open angle  glaucoma 10/2015   Bilateral   Rapid heart rate    during pre-menopause - thinks it was due to a HTN medication   Restless leg syndrome 07/16/2020   Right lower lobe lung mass 09/04/2021     No family history on file.   Past Surgical History:  Procedure Laterality Date   APPENDECTOMY     COLONOSCOPY     DENTAL SURGERY     LAPAROSCOPIC SALPINGO OOPHERECTOMY     LINGUAL FRENECTOMY     at 9 years    Social History   Socioeconomic History   Marital status: Married    Spouse name: Not on file   Number of children: Not on file   Years of education: Not on file   Highest education level: Not on file  Occupational History   Not on file  Tobacco Use   Smoking status: Every Day    Packs/day: 1.00    Years: 60.00    Total pack years: 60.00    Types: Cigarettes   Smokeless tobacco: Never   Tobacco comments:    Smoking 10 packs of cigarettes a day. ARJ 02/16/22  Vaping Use   Vaping Use: Former   Devices: Did a trial study for vapes  Substance and Sexual Activity   Alcohol use: Yes    Comment: occasional   Drug use: Never   Sexual activity: Not Currently  Birth control/protection: Post-menopausal  Other Topics Concern   Not on file  Social History Narrative   Not on file   Social Determinants of Health   Financial Resource Strain: Not on file  Food Insecurity: Not on file  Transportation Needs: Not on file  Physical Activity: Not on file  Stress: Not on file  Social Connections: Not on file  Intimate Partner Violence: Not on file     Allergies  Allergen Reactions   Codeine Other (See Comments)    White spots on throat, Looks like strep throat (allergic reaction)     Outpatient Medications Prior to Visit  Medication Sig Dispense Refill   albuterol (VENTOLIN HFA) 108 (90 Base) MCG/ACT inhaler Inhale 2 puffs into the lungs every 6 (six) hours as needed for wheezing or shortness of breath. 8 g 6   aspirin EC 325 MG tablet Take 325 mg by mouth every 6 (six)  hours as needed for moderate pain.     COLLAGEN PO Take 1 Scoop by mouth 3 (three) times a week.     doxycycline (VIBRA-TABS) 100 MG tablet Take 1 tablet (100 mg total) by mouth 2 (two) times daily. 14 tablet 0   Iodine TINC Apply 1 Application topically 2 (two) times a week.     latanoprost (XALATAN) 0.005 % ophthalmic solution Place 1 drop into both eyes at bedtime.     Multiple Vitamins-Minerals (EMERGEN-C IMMUNE PLUS) PACK Take 1 packet by mouth daily.     nicotine (NICODERM CQ - DOSED IN MG/24 HOURS) 14 mg/24hr patch Place 14 mg onto the skin daily as needed (nicotine dependence).     Omega-3 Fatty Acids (SALMON OIL PO) Take 1 capsule by mouth 3 (three) times a week.     phenol (CHLORASEPTIC) 1.4 % LIQD Use as directed 2 sprays in the mouth or throat as needed for throat irritation / pain.     predniSONE (DELTASONE) 20 MG tablet Take 2 tablets (40 mg total) by mouth daily with breakfast. 10 tablet 0   sacubitril-valsartan (ENTRESTO) 49-51 MG Take 0.5 tablets by mouth daily.     Vitamin D, Ergocalciferol, (DRISDOL) 1.25 MG (50000 UNIT) CAPS capsule Take 50,000 Units by mouth every Monday.     No facility-administered medications prior to visit.    Review of Systems  Constitutional:  Negative for chills, fever, malaise/fatigue and weight loss.  HENT:  Negative for hearing loss, sore throat and tinnitus.   Eyes:  Negative for blurred vision and double vision.  Respiratory:  Positive for hemoptysis and shortness of breath. Negative for cough, sputum production, wheezing and stridor.   Cardiovascular:  Negative for chest pain, palpitations, orthopnea, leg swelling and PND.  Gastrointestinal:  Negative for abdominal pain, constipation, diarrhea, heartburn, nausea and vomiting.  Genitourinary:  Negative for dysuria, hematuria and urgency.  Musculoskeletal:  Negative for joint pain and myalgias.  Skin:  Negative for itching and rash.  Neurological:  Negative for dizziness, tingling, weakness  and headaches.  Endo/Heme/Allergies:  Negative for environmental allergies. Does not bruise/bleed easily.  Psychiatric/Behavioral:  Negative for depression. The patient is not nervous/anxious and does not have insomnia.   All other systems reviewed and are negative.    Objective:  Physical Exam Vitals reviewed.  Constitutional:      General: She is not in acute distress.    Appearance: She is well-developed.  HENT:     Head: Normocephalic and atraumatic.     Mouth/Throat:     Pharynx: No oropharyngeal exudate.  Eyes:     Conjunctiva/sclera: Conjunctivae normal.     Pupils: Pupils are equal, round, and reactive to light.  Neck:     Vascular: No JVD.     Trachea: No tracheal deviation.     Comments: Loss of supraclavicular fat Cardiovascular:     Rate and Rhythm: Normal rate and regular rhythm.     Heart sounds: S1 normal and S2 normal.     Comments: Distant heart tones Pulmonary:     Effort: No tachypnea or accessory muscle usage.     Breath sounds: No stridor. Decreased breath sounds (throughout all lung fields) present. No wheezing, rhonchi or rales.  Abdominal:     General: Bowel sounds are normal. There is no distension.     Palpations: Abdomen is soft.     Tenderness: There is no abdominal tenderness.  Musculoskeletal:        General: Deformity (muscle wasting ) present.  Skin:    General: Skin is warm and dry.     Capillary Refill: Capillary refill takes less than 2 seconds.     Findings: No rash.  Neurological:     Mental Status: She is alert and oriented to person, place, and time.  Psychiatric:        Behavior: Behavior normal.      Vitals:   02/16/22 1106  BP: 118/68  Pulse: 79  Temp: 98 F (36.7 C)  TempSrc: Oral  SpO2: 99%  Weight: 121 lb 3.2 oz (55 kg)  Height: 5\' 4"  (1.626 m)   99% on RA BMI Readings from Last 3 Encounters:  02/16/22 20.80 kg/m  01/06/22 21.20 kg/m  11/26/21 21.03 kg/m   Wt Readings from Last 3 Encounters:  02/16/22  121 lb 3.2 oz (55 kg)  01/06/22 121 lb 9.6 oz (55.2 kg)  11/26/21 120 lb 9.6 oz (54.7 kg)     CBC    Component Value Date/Time   WBC 12.4 (H) 12/15/2015 0017   RBC 4.92 12/15/2015 0017   HGB 14.9 12/15/2015 0017   HCT 44.7 12/15/2015 0017   PLT 284 12/15/2015 0017   MCV 90.9 12/15/2015 0017   MCH 30.3 12/15/2015 0017   MCHC 33.3 12/15/2015 0017   RDW 13.5 12/15/2015 0017     Chest Imaging: LDCT June: 72mm RLL lung nodule Concerning for malignancy The patient's images have been independently reviewed by me.    Nuclear medicine PET scan 1/60/1093: Hypermetabolic lesion 2.6 cm pulmonary nodule concerning for primary bronchogenic carcinoma. The patient's images have been independently reviewed by me.    Pulmonary Functions Testing Results:     No data to display          FeNO:   Pathology:   Echocardiogram:   Heart Catheterization:     Assessment & Plan:     ICD-10-CM   1. Pulmonary mass  R91.8     2. Lung nodule  R91.1     3. Current smoker  F17.200       Discussion:  This is a 71 year old female longstanding history of tobacco use abnormal CT imaging with lung nodule concerning for primary bronchogenic carcinoma.  She has had some hemoptysis since last seen.  Her procedure has been rescheduled multiple times due to patient's illness.  She is doing better.  Plan: She has recovered now approximately 6 weeks from her respiratory illness.  She is willing to undergo procedure.  Plan reschedule bronchoscopy on 02/17/2022. We talked about the risk of bleeding and pneumothorax today  in the office. We will give her a new letter today with all of description of details for her procedure tomorrow.  We will see her tomorrow and endoscopy.   Current Outpatient Medications:    albuterol (VENTOLIN HFA) 108 (90 Base) MCG/ACT inhaler, Inhale 2 puffs into the lungs every 6 (six) hours as needed for wheezing or shortness of breath., Disp: 8 g, Rfl: 6   aspirin EC 325  MG tablet, Take 325 mg by mouth every 6 (six) hours as needed for moderate pain., Disp: , Rfl:    COLLAGEN PO, Take 1 Scoop by mouth 3 (three) times a week., Disp: , Rfl:    doxycycline (VIBRA-TABS) 100 MG tablet, Take 1 tablet (100 mg total) by mouth 2 (two) times daily., Disp: 14 tablet, Rfl: 0   Iodine TINC, Apply 1 Application topically 2 (two) times a week., Disp: , Rfl:    latanoprost (XALATAN) 0.005 % ophthalmic solution, Place 1 drop into both eyes at bedtime., Disp: , Rfl:    Multiple Vitamins-Minerals (EMERGEN-C IMMUNE PLUS) PACK, Take 1 packet by mouth daily., Disp: , Rfl:    nicotine (NICODERM CQ - DOSED IN MG/24 HOURS) 14 mg/24hr patch, Place 14 mg onto the skin daily as needed (nicotine dependence)., Disp: , Rfl:    Omega-3 Fatty Acids (SALMON OIL PO), Take 1 capsule by mouth 3 (three) times a week., Disp: , Rfl:    phenol (CHLORASEPTIC) 1.4 % LIQD, Use as directed 2 sprays in the mouth or throat as needed for throat irritation / pain., Disp: , Rfl:    predniSONE (DELTASONE) 20 MG tablet, Take 2 tablets (40 mg total) by mouth daily with breakfast., Disp: 10 tablet, Rfl: 0   sacubitril-valsartan (ENTRESTO) 49-51 MG, Take 0.5 tablets by mouth daily., Disp: , Rfl:    Vitamin D, Ergocalciferol, (DRISDOL) 1.25 MG (50000 UNIT) CAPS capsule, Take 50,000 Units by mouth every Monday., Disp: , Rfl:     Garner Nash, DO  Pulmonary Critical Care 02/16/2022 11:39 AM

## 2022-02-17 ENCOUNTER — Other Ambulatory Visit: Payer: Self-pay

## 2022-02-17 ENCOUNTER — Ambulatory Visit (HOSPITAL_COMMUNITY)
Admission: RE | Admit: 2022-02-17 | Discharge: 2022-02-17 | Disposition: A | Discharge status: Home / Self Care | Payer: Medicare Other | Attending: Pulmonary Disease | Admitting: Pulmonary Disease

## 2022-02-17 ENCOUNTER — Encounter (HOSPITAL_COMMUNITY)
Admission: RE | Disposition: A | Discharge status: Home / Self Care | Payer: Self-pay | Source: Home / Self Care | Attending: Pulmonary Disease

## 2022-02-17 ENCOUNTER — Ambulatory Visit (HOSPITAL_COMMUNITY): Payer: Medicare Other | Admitting: Critical Care Medicine

## 2022-02-17 ENCOUNTER — Encounter (HOSPITAL_COMMUNITY): Payer: Self-pay | Admitting: Pulmonary Disease

## 2022-02-17 ENCOUNTER — Ambulatory Visit (HOSPITAL_COMMUNITY): Payer: Medicare Other

## 2022-02-17 ENCOUNTER — Ambulatory Visit (HOSPITAL_BASED_OUTPATIENT_CLINIC_OR_DEPARTMENT_OTHER): Payer: Medicare Other | Admitting: Critical Care Medicine

## 2022-02-17 DIAGNOSIS — I10 Essential (primary) hypertension: Secondary | ICD-10-CM | POA: Diagnosis not present

## 2022-02-17 DIAGNOSIS — C3431 Malignant neoplasm of lower lobe, right bronchus or lung: Secondary | ICD-10-CM | POA: Insufficient documentation

## 2022-02-17 DIAGNOSIS — F17210 Nicotine dependence, cigarettes, uncomplicated: Secondary | ICD-10-CM | POA: Insufficient documentation

## 2022-02-17 DIAGNOSIS — R918 Other nonspecific abnormal finding of lung field: Secondary | ICD-10-CM | POA: Diagnosis not present

## 2022-02-17 DIAGNOSIS — C3490 Malignant neoplasm of unspecified part of unspecified bronchus or lung: Secondary | ICD-10-CM | POA: Diagnosis not present

## 2022-02-17 DIAGNOSIS — R911 Solitary pulmonary nodule: Secondary | ICD-10-CM | POA: Diagnosis not present

## 2022-02-17 HISTORY — DX: Gastro-esophageal reflux disease without esophagitis: K21.9

## 2022-02-17 HISTORY — PX: FINE NEEDLE ASPIRATION: SHX5430

## 2022-02-17 HISTORY — PX: BRONCHIAL NEEDLE ASPIRATION BIOPSY: SHX5106

## 2022-02-17 HISTORY — PX: VIDEO BRONCHOSCOPY WITH ENDOBRONCHIAL ULTRASOUND: SHX6177

## 2022-02-17 HISTORY — PX: BRONCHIAL BIOPSY: SHX5109

## 2022-02-17 HISTORY — PX: BRONCHIAL BRUSHINGS: SHX5108

## 2022-02-17 LAB — BASIC METABOLIC PANEL
Anion gap: 11 (ref 5–15)
BUN: 16 mg/dL (ref 8–23)
CO2: 23 mmol/L (ref 22–32)
Calcium: 9.2 mg/dL (ref 8.9–10.3)
Chloride: 105 mmol/L (ref 98–111)
Creatinine, Ser: 0.79 mg/dL (ref 0.44–1.00)
GFR, Estimated: >60 mL/min (ref >60)
Glucose, Bld: 82 mg/dL (ref 70–99)
Potassium: 3.7 mmol/L (ref 3.5–5.1)
Sodium: 139 mmol/L (ref 135–145)

## 2022-02-17 LAB — CBC
HCT: 40.4 % (ref 36.0–46.0)
Hemoglobin: 13.8 g/dL (ref 12.0–15.0)
MCH: 30.9 pg (ref 26.0–34.0)
MCHC: 34.2 g/dL (ref 30.0–36.0)
MCV: 90.4 fL (ref 80.0–100.0)
Platelets: 271 10*3/uL (ref 150–400)
RBC: 4.47 MIL/uL (ref 3.87–5.11)
RDW: 13.4 % (ref 11.5–15.5)
WBC: 7.9 10*3/uL (ref 4.0–10.5)
nRBC: 0 % (ref 0.0–0.2)

## 2022-02-17 SURGERY — BRONCHOSCOPY, WITH BIOPSY USING ELECTROMAGNETIC NAVIGATION
Anesthesia: General | Laterality: Right

## 2022-02-17 MED ORDER — PROPOFOL 500 MG/50ML IV EMUL
INTRAVENOUS | Status: DC | PRN
  Administered 2022-02-17: 100 ug/kg/min via INTRAVENOUS

## 2022-02-17 MED ORDER — PHENYLEPHRINE 80 MCG/ML (10ML) SYRINGE FOR IV PUSH (FOR BLOOD PRESSURE SUPPORT)
PREFILLED_SYRINGE | INTRAVENOUS | Status: DC | PRN
  Administered 2022-02-17: 80 ug via INTRAVENOUS

## 2022-02-17 MED ORDER — ACETAMINOPHEN 500 MG PO TABS
1000.0000 mg | ORAL_TABLET | Freq: Once | ORAL | Status: DC
  Filled 2022-02-17: qty 2

## 2022-02-17 MED ORDER — LACTATED RINGERS IV SOLN: INTRAVENOUS | Status: DC

## 2022-02-17 MED ORDER — ONDANSETRON HCL 4 MG/2ML IJ SOLN
INTRAMUSCULAR | Status: DC | PRN
  Administered 2022-02-17: 4 mg via INTRAVENOUS

## 2022-02-17 MED ORDER — MIDAZOLAM HCL 5 MG/5ML IJ SOLN
INTRAMUSCULAR | Status: DC | PRN
  Administered 2022-02-17 (×2): 1 mg via INTRAVENOUS

## 2022-02-17 MED ORDER — PHENYLEPHRINE HCL-NACL 20-0.9 MG/250ML-% IV SOLN
INTRAVENOUS | Status: DC | PRN
  Administered 2022-02-17: 15 ug/min via INTRAVENOUS

## 2022-02-17 MED ORDER — LIDOCAINE 2% (20 MG/ML) 5 ML SYRINGE
INTRAMUSCULAR | Status: DC | PRN
  Administered 2022-02-17: 60 mg via INTRAVENOUS

## 2022-02-17 MED ORDER — FENTANYL CITRATE (PF) 100 MCG/2ML IJ SOLN: 25.0000 ug | INTRAMUSCULAR | Status: DC | PRN

## 2022-02-17 MED ORDER — PROPOFOL 10 MG/ML IV BOLUS
INTRAVENOUS | Status: DC | PRN
  Administered 2022-02-17: 30 mg via INTRAVENOUS
  Administered 2022-02-17: 120 mg via INTRAVENOUS
  Administered 2022-02-17: 50 mg via INTRAVENOUS

## 2022-02-17 MED ORDER — SUGAMMADEX SODIUM 200 MG/2ML IV SOLN
INTRAVENOUS | Status: DC | PRN
  Administered 2022-02-17: 200 mg via INTRAVENOUS

## 2022-02-17 MED ORDER — ROCURONIUM BROMIDE 10 MG/ML (PF) SYRINGE
PREFILLED_SYRINGE | INTRAVENOUS | Status: DC | PRN
  Administered 2022-02-17: 50 mg via INTRAVENOUS

## 2022-02-17 MED ORDER — ESMOLOL HCL 100 MG/10ML IV SOLN
INTRAVENOUS | Status: DC | PRN
  Administered 2022-02-17: 20 mg via INTRAVENOUS

## 2022-02-17 MED ORDER — CHLORHEXIDINE GLUCONATE 0.12 % MT SOLN
15.0000 mL | Freq: Once | OROMUCOSAL | Status: AC
  Administered 2022-02-17: 15 mL via OROMUCOSAL
  Filled 2022-02-17: qty 15

## 2022-02-17 MED ORDER — DEXAMETHASONE SODIUM PHOSPHATE 10 MG/ML IJ SOLN
INTRAMUSCULAR | Status: DC | PRN
  Administered 2022-02-17: 4 mg via INTRAVENOUS

## 2022-02-17 SURGICAL SUPPLY — 29 items
BRUSH CYTOL CELLEBRITY 1.5X140 (MISCELLANEOUS) IMPLANT
CANISTER SUCT 3000ML PPV (MISCELLANEOUS) ×4 IMPLANT
CONT SPEC 4OZ CLIKSEAL STRL BL (MISCELLANEOUS) ×4 IMPLANT
COVER BACK TABLE 60X90IN (DRAPES) ×4 IMPLANT
COVER DOME SNAP 22 D (MISCELLANEOUS) ×4 IMPLANT
FORCEPS BIOP RJ4 1.8 (CUTTING FORCEPS) IMPLANT
GAUZE SPONGE 4X4 12PLY STRL (GAUZE/BANDAGES/DRESSINGS) ×4 IMPLANT
GLOVE BIO SURGEON STRL SZ7.5 (GLOVE) ×4 IMPLANT
GOWN STRL REUS W/ TWL LRG LVL3 (GOWN DISPOSABLE) ×4 IMPLANT
GOWN STRL REUS W/TWL LRG LVL3 (GOWN DISPOSABLE) ×3
KIT CLEAN ENDO COMPLIANCE (KITS) ×8 IMPLANT
KIT TURNOVER KIT B (KITS) ×4 IMPLANT
MARKER SKIN DUAL TIP RULER LAB (MISCELLANEOUS) ×4 IMPLANT
NDL EBUS SONO TIP PENTAX (NEEDLE) ×4 IMPLANT
NEEDLE EBUS SONO TIP PENTAX (NEEDLE) ×3 IMPLANT
NS IRRIG 1000ML POUR BTL (IV SOLUTION) ×4 IMPLANT
OIL SILICONE PENTAX (PARTS (SERVICE/REPAIRS)) ×4 IMPLANT
PAD ARMBOARD 7.5X6 YLW CONV (MISCELLANEOUS) ×8 IMPLANT
SOL ANTI FOG 6CC (MISCELLANEOUS) ×4 IMPLANT
SYR 20CC LL (SYRINGE) ×8 IMPLANT
SYR 20ML ECCENTRIC (SYRINGE) ×8 IMPLANT
SYR 50ML SLIP (SYRINGE) IMPLANT
SYR 5ML LUER SLIP (SYRINGE) ×4 IMPLANT
TOWEL OR 17X24 6PK STRL BLUE (TOWEL DISPOSABLE) ×4 IMPLANT
TRAP SPECIMEN MUCOUS 40CC (MISCELLANEOUS) IMPLANT
TUBE CONNECTING 20X1/4 (TUBING) ×8 IMPLANT
UNDERPAD 30X30 (UNDERPADS AND DIAPERS) ×4 IMPLANT
VALVE DISPOSABLE (MISCELLANEOUS) ×4 IMPLANT
WATER STERILE IRR 1000ML POUR (IV SOLUTION) ×4 IMPLANT

## 2022-02-17 NOTE — Anesthesia Preprocedure Evaluation (Addendum)
Anesthesia Evaluation  Patient identified by MRN, date of birth, ID band Patient awake    Reviewed: Allergy & Precautions, H&P , NPO status , Patient's Chart, lab work & pertinent test results  Airway Mallampati: II  TM Distance: >3 FB Neck ROM: Full    Dental no notable dental hx. (+) Teeth Intact, Dental Advisory Given   Pulmonary Current Smoker and Patient abstained from smoking.   Pulmonary exam normal breath sounds clear to auscultation       Cardiovascular hypertension,  Rhythm:Regular Rate:Normal     Neuro/Psych   Anxiety Depression    negative neurological ROS     GI/Hepatic Neg liver ROS,GERD  ,,  Endo/Other  negative endocrine ROS    Renal/GU negative Renal ROS  negative genitourinary   Musculoskeletal  (+) Arthritis , Osteoarthritis,    Abdominal   Peds  Hematology  (+) Blood dyscrasia, anemia   Anesthesia Other Findings   Reproductive/Obstetrics negative OB ROS                             Anesthesia Physical Anesthesia Plan  ASA: 2  Anesthesia Plan: General   Post-op Pain Management: Tylenol PO (pre-op)*   Induction: Intravenous  PONV Risk Score and Plan: 3 and Ondansetron, Dexamethasone, Propofol infusion and TIVA  Airway Management Planned: Oral ETT  Additional Equipment:   Intra-op Plan:   Post-operative Plan: Extubation in OR  Informed Consent: I have reviewed the patients History and Physical, chart, labs and discussed the procedure including the risks, benefits and alternatives for the proposed anesthesia with the patient or authorized representative who has indicated his/her understanding and acceptance.     Dental advisory given  Plan Discussed with: CRNA  Anesthesia Plan Comments:        Anesthesia Quick Evaluation

## 2022-02-17 NOTE — Transfer of Care (Signed)
Immediate Anesthesia Transfer of Care Note  Patient: Carla Lane  Procedure(s) Performed: ROBOTIC ASSISTED NAVIGATIONAL BRONCHOSCOPY (Right) VIDEO BRONCHOSCOPY WITH ENDOBRONCHIAL ULTRASOUND (Bilateral) BRONCHIAL BIOPSIES BRONCHIAL BRUSHINGS FINE NEEDLE ASPIRATION (FNA) LINEAR BRONCHIAL NEEDLE ASPIRATION BIOPSIES  Patient Location: PACU  Anesthesia Type:General  Level of Consciousness: awake, alert , and oriented  Airway & Oxygen Therapy: Patient Spontanous Breathing and Patient connected to nasal cannula oxygen  Post-op Assessment: Report given to RN and Post -op Vital signs reviewed and stable  Post vital signs: Reviewed and stable  Last Vitals:  Vitals Value Taken Time  BP 114/64 02/17/22 1203  Temp 36.1 C 02/17/22 1200  Pulse 80 02/17/22 1204  Resp 13 02/17/22 1204  SpO2 93 % 02/17/22 1204  Vitals shown include unvalidated device data.  Last Pain:  Vitals:   02/17/22 0937  PainSc: 0-No pain         Complications: No notable events documented.

## 2022-02-17 NOTE — Anesthesia Postprocedure Evaluation (Signed)
Anesthesia Post Note  Patient: Carla Lane  Procedure(s) Performed: ROBOTIC ASSISTED NAVIGATIONAL BRONCHOSCOPY (Right) VIDEO BRONCHOSCOPY WITH ENDOBRONCHIAL ULTRASOUND (Bilateral) BRONCHIAL BIOPSIES BRONCHIAL BRUSHINGS FINE NEEDLE ASPIRATION (FNA) LINEAR BRONCHIAL NEEDLE ASPIRATION BIOPSIES     Patient location during evaluation: PACU Anesthesia Type: General Level of consciousness: awake and alert Pain management: pain level controlled Vital Signs Assessment: post-procedure vital signs reviewed and stable Respiratory status: spontaneous breathing, nonlabored ventilation and respiratory function stable Cardiovascular status: blood pressure returned to baseline and stable Postop Assessment: no apparent nausea or vomiting Anesthetic complications: no  No notable events documented.  Last Vitals:  Vitals:   02/17/22 1215 02/17/22 1230  BP: 137/71 (!) 155/83  Pulse: 83 95  Resp: 19 13  Temp:    SpO2: 98% 98%    Last Pain:  Vitals:   02/17/22 1200  PainSc: 0-No pain                 Jatin Naumann,W. EDMOND

## 2022-02-17 NOTE — Interval H&P Note (Signed)
History and Physical Interval Note:  02/17/2022 10:45 AM  Carla Lane  has presented today for surgery, with the diagnosis of lung nodule.  The various methods of treatment have been discussed with the patient and family. After consideration of risks, benefits and other options for treatment, the patient has consented to  Procedure(s) with comments: ROBOTIC ASSISTED NAVIGATIONAL BRONCHOSCOPY (Right) - ION w/ CIOS VIDEO BRONCHOSCOPY WITH ENDOBRONCHIAL ULTRASOUND (Bilateral) as a surgical intervention.  The patient's history has been reviewed, patient examined, no change in status, stable for surgery.  I have reviewed the patient's chart and labs.  Questions were answered to the patient's satisfaction.     Marcus Hook

## 2022-02-17 NOTE — Op Note (Signed)
Video Bronchoscopy with Robotic Assisted Bronchoscopic Navigation  Video Bronchoscopy with Endobronchial Ultrasound Procedure Note  Date of Operation: 02/17/2022   Pre-op Diagnosis: Right lower lobe lung nodule  Post-op Diagnosis: Right lower lobe lung nodule  Surgeon: Garner Nash, DO   Assistants: None   Anesthesia: General endotracheal anesthesia  Operation: Flexible video fiberoptic bronchoscopy with robotic assistance and biopsies.  Estimated Blood Loss: Minimal  Complications: None  Indications and History: Carla Lane is a 71 y.o. female with history of tobacco use, right lower lobe lung nodule. The risks, benefits, complications, treatment options and expected outcomes were discussed with the patient.  The possibilities of pneumothorax, pneumonia, reaction to medication, pulmonary aspiration, perforation of a viscus, bleeding, failure to diagnose a condition and creating a complication requiring transfusion or operation were discussed with the patient who freely signed the consent.    Description of Procedure: The patient was seen in the Preoperative Area, was examined and was deemed appropriate to proceed.  The patient was taken to [facility] endoscopy room 3, identified as Carla Lane and the procedure verified as Flexible Video Fiberoptic Bronchoscopy.  A Time Out was held and the above information confirmed.   Prior to the date of the procedure a high-resolution CT scan of the chest was performed. Utilizing ION software program a virtual tracheobronchial tree was generated to allow the creation of distinct navigation pathways to the patient's parenchymal abnormalities. After being taken to the operating room general anesthesia was initiated and the patient  was orally intubated. The video fiberoptic bronchoscope was introduced via the endotracheal tube and a general inspection was performed which showed normal right and left lung anatomy, aspiration of the bilateral mainstems  was completed to remove any remaining secretions. Robotic catheter inserted into patient's endotracheal tube.   Target #1 right lower lobe lung nodule: The distinct navigation pathways prepared prior to this procedure were then utilized to navigate to patient's lesion identified on CT scan. The robotic catheter was secured into place and the vision probe was withdrawn.  Lesion location was approximated using fluoroscopy and three-dimensional cone beam CT imaging for peripheral targeting. Under fluoroscopic guidance transbronchial needle brushings, transbronchial needle biopsies, and transbronchial forceps biopsies were performed to be sent for cytology and pathology.   Target #2 station 11 L: The standard scope was then withdrawn and the endobronchial ultrasound was used to identify and characterize the peritracheal, hilar and bronchial lymph nodes. Inspection showed normal right hilar lymph nodes, no pretracheal lymph nodes, normal subcarinal nodes, enlarged 11 L. Using real-time ultrasound guidance Wang needle biopsies were take from Station 11L nodes and were sent for cytology. The patient tolerated the procedure well without apparent complications. There was no significant blood loss. The bronchoscope was withdrawn.   At the end of the procedure a general airway inspection was performed and there was no evidence of active bleeding. The bronchoscope was removed.  The patient tolerated the procedure well. There was no significant blood loss and there were no obvious complications. A post-procedural chest x-ray is pending.  Samples Target #1: 1. Transbronchial needle brushings from RLL 2. Transbronchial Wang needle biopsies from RLL 3. Transbronchial forceps biopsies from RLL  Samples Target #2: 1. Wang needle biopsies from 11L node  Plans:  The patient will be discharged from the PACU to home when recovered from anesthesia and after chest x-ray is reviewed. We will review the cytology, pathology  results with the patient when they become available. Outpatient followup will be with  Garner Nash, DO.    Garner Nash, DO [place] [facility] 02/17/2022 12:11 PM

## 2022-02-17 NOTE — Anesthesia Procedure Notes (Addendum)
Procedure Name: Intubation Date/Time: 02/17/2022 10:54 AM  Performed by: Wilburn Cornelia, CRNAPre-anesthesia Checklist: Patient identified, Emergency Drugs available, Suction available, Patient being monitored and Timeout performed Patient Re-evaluated:Patient Re-evaluated prior to induction Oxygen Delivery Method: Circle system utilized Preoxygenation: Pre-oxygenation with 100% oxygen Induction Type: IV induction Ventilation: Mask ventilation without difficulty Laryngoscope Size: Mac and 3 Grade View: Grade I Tube size: 8.5 mm Number of attempts: 1 Airway Equipment and Method: Stylet Placement Confirmation: ETT inserted through vocal cords under direct vision, positive ETCO2, breath sounds checked- equal and bilateral and CO2 detector Secured at: 21 cm Tube secured with: Tape Dental Injury: Teeth and Oropharynx as per pre-operative assessment

## 2022-02-17 NOTE — Discharge Instructions (Signed)
Flexible Bronchoscopy, Care After This sheet gives you information about how to care for yourself after your test. Your doctor may also give you more specific instructions. If you have problems or questions, contact your doctor. Follow these instructions at home: Eating and drinking Do not eat or drink anything (not even water) for 2 hours after your test, or until your numbing medicine (local anesthetic) wears off. When your numbness is gone and your cough and gag reflexes have come back, you may: Eat only soft foods. Slowly drink liquids. The day after the test, go back to your normal diet. Driving Do not drive for 24 hours if you were given a medicine to help you relax (sedative). Do not drive or use heavy machinery while taking prescription pain medicine. General instructions  Take over-the-counter and prescription medicines only as told by your doctor. Return to your normal activities as told. Ask what activities are safe for you. Do not use any products that have nicotine or tobacco in them. This includes cigarettes and e-cigarettes. If you need help quitting, ask your doctor. Keep all follow-up visits as told by your doctor. This is important. It is very important if you had a tissue sample (biopsy) taken. Get help right away if: You have shortness of breath that gets worse. You get light-headed. You feel like you are going to pass out (faint). You have chest pain. You cough up: More than a little blood. More blood than before. Summary Do not eat or drink anything (not even water) for 2 hours after your test, or until your numbing medicine wears off. Do not use cigarettes. Do not use e-cigarettes. Get help right away if you have chest pain.  This information is not intended to replace advice given to you by your health care provider. Make sure you discuss any questions you have with your health care provider. Document Released: 12/28/2008 Document Revised: 02/12/2017 Document  Reviewed: 03/20/2016 Elsevier Patient Education  2020 [employer].

## 2022-02-19 LAB — CYTOLOGY - NON PAP

## 2022-02-22 ENCOUNTER — Encounter (HOSPITAL_COMMUNITY): Payer: Self-pay | Admitting: Pulmonary Disease

## 2022-02-23 ENCOUNTER — Ambulatory Visit
Admission: RE | Admit: 2022-02-23 | Discharge: 2022-02-23 | Disposition: A | Discharge status: Home / Self Care | Payer: Medicare Other | Source: Ambulatory Visit | Attending: Radiation Oncology | Admitting: Radiation Oncology

## 2022-02-23 ENCOUNTER — Other Ambulatory Visit: Payer: Self-pay

## 2022-02-23 VITALS — BP 171/74 | HR 82 | Temp 96.7°F | Resp 18 | Ht 64.0 in | Wt 119.4 lb

## 2022-02-23 DIAGNOSIS — Z79899 Other long term (current) drug therapy: Secondary | ICD-10-CM | POA: Diagnosis not present

## 2022-02-23 DIAGNOSIS — G2581 Restless legs syndrome: Secondary | ICD-10-CM | POA: Diagnosis not present

## 2022-02-23 DIAGNOSIS — F17210 Nicotine dependence, cigarettes, uncomplicated: Secondary | ICD-10-CM | POA: Diagnosis not present

## 2022-02-23 DIAGNOSIS — K219 Gastro-esophageal reflux disease without esophagitis: Secondary | ICD-10-CM | POA: Insufficient documentation

## 2022-02-23 DIAGNOSIS — Z86010 Personal history of colonic polyps: Secondary | ICD-10-CM | POA: Diagnosis not present

## 2022-02-23 DIAGNOSIS — C3431 Malignant neoplasm of lower lobe, right bronchus or lung: Secondary | ICD-10-CM

## 2022-02-23 DIAGNOSIS — C3490 Malignant neoplasm of unspecified part of unspecified bronchus or lung: Secondary | ICD-10-CM | POA: Diagnosis not present

## 2022-02-23 DIAGNOSIS — I10 Essential (primary) hypertension: Secondary | ICD-10-CM | POA: Diagnosis not present

## 2022-02-23 DIAGNOSIS — G893 Neoplasm related pain (acute) (chronic): Secondary | ICD-10-CM | POA: Diagnosis not present

## 2022-02-23 NOTE — Progress Notes (Signed)
I called and spoke with patient regarding pathology results.  She has already set up with Dr. Tammi Klippel. Planing meeting on Wednesday.   Thanks,  BLI  Garner Nash, DO  Pulmonary Critical Care 02/23/2022 4:59 PM

## 2022-02-23 NOTE — Progress Notes (Signed)
[facility]         [phone] ________________________________  Initial Outpatient Consultation  Name: Carla Lane MRN: [id number]  Date: 02/23/2022  DOB: October 15, 1950  REFERRING PHYSICIAN: Garner Nash, DO  DIAGNOSIS: 71 yo woman with a 3.8 cm non-small cell carcinoma of the right lower lobe of the lung - Stage IB (cT2a, cN0, cM0)     ICD-10-CM   1. Malignant neoplasm of lower lobe of right lung (HCC)  C34.31     2. Primary cancer of right lower lobe of lung (HCC)  C34.31       HISTORY OF PRESENT ILLNESS::Carla Lane is a 71 y.o. female with a longstanding smoking history of 60 years who is being seen at the request of Dr. Valeta Harms to review radiation treatment options. Patient was found to have a suspicious 26.5 mm lung nodule  in the right lower lobe on a lung cancer screening CT on 09/04/21. PET scan on 12/04/21 showed a hypermetabolic right lower lobe 2.6 cm pulmonary nodule, most consistent with primary bronchogenic carcinoma. There was no evidence of thoracic nodal or distant metastatic disease. CT on 12/08/21 showed a 2.9 x 1.9 x 3.8 cm mass in the medial right lower lobe with no evidence of disease spread. Dr. Valeta Harms performed bronchoscopy and biopsy of the right lower lobe nodule on 02/17/22 which revealed malignant cells consistent with non-small cell carcinoma. 11L lymph node showed no malignant cells. She is still currently smoking.   Today she reports to be recovering well from her bronchoscopy. She has recently had a productive cough but denies any fever chills or back pain. She has noticed a burning in her chest since her bronchoscopy as well as some blood tinged sputum this morning. She denies any fever or chills. She does have a one year history of right sided back pain that has gradually become more constant.    PREVIOUS RADIATION THERAPY: No  Past Medical History:  Diagnosis Date   Anemia    during menstruating years   Anxiety    Arthritis     Cancer ([place])    squamous (tip of nose) Basal - top of shoulder, top of each ears and top of forehead   Cataracts, bilateral 05/2017   Colon polyps    Depression    Dyspnea 11/2021   GERD (gastroesophageal reflux disease)    Hypertension    does not routinely take her blood pressure medications every day.   Pneumonia    Primary open angle glaucoma 10/2015   Bilateral   Rapid heart rate    during pre-menopause - thinks it was due to a HTN medication   Restless leg syndrome 07/16/2020   Right lower lobe lung mass 09/04/2021  :   Past Surgical History:  Procedure Laterality Date   APPENDECTOMY     BRONCHIAL BIOPSY  02/17/2022   Procedure: BRONCHIAL BIOPSIES;  Surgeon: Garner Nash, DO;  Location: [facility];  Service: Pulmonary;;   BRONCHIAL BRUSHINGS  02/17/2022   Procedure: BRONCHIAL BRUSHINGS;  Surgeon: Garner Nash, DO;  Location: [facility];  Service: Pulmonary;;   BRONCHIAL NEEDLE ASPIRATION BIOPSY  02/17/2022   Procedure: BRONCHIAL NEEDLE ASPIRATION BIOPSIES;  Surgeon: Garner Nash, DO;  Location: [facility];  Service: Pulmonary;;   COLONOSCOPY     DENTAL SURGERY     FINE NEEDLE ASPIRATION  02/17/2022   Procedure: FINE NEEDLE ASPIRATION (FNA) LINEAR;  Surgeon: Garner Nash, DO;  Location: [facility];  Service:  Pulmonary;;   LAPAROSCOPIC SALPINGO OOPHERECTOMY     LINGUAL FRENECTOMY     at 9 years   MOHS SURGERY     VIDEO BRONCHOSCOPY WITH ENDOBRONCHIAL ULTRASOUND Bilateral 02/17/2022   Procedure: VIDEO BRONCHOSCOPY WITH ENDOBRONCHIAL ULTRASOUND;  Surgeon: Garner Nash, DO;  Location: [facility];  Service: Pulmonary;  Laterality: Bilateral;  :   Current Outpatient Medications:    albuterol (VENTOLIN HFA) 108 (90 Base) MCG/ACT inhaler, Inhale 2 puffs into the lungs every 6 (six) hours as needed for wheezing or shortness of breath., Disp: 8 g, Rfl: 6   aspirin EC 325 MG tablet, Take 325 mg by mouth every 6 (six) hours as needed for moderate pain.,  Disp: , Rfl:    COLLAGEN PO, Take 1 Scoop by mouth 3 (three) times a week., Disp: , Rfl:    doxycycline (VIBRA-TABS) 100 MG tablet, Take 1 tablet (100 mg total) by mouth 2 (two) times daily., Disp: 14 tablet, Rfl: 0   Iodine TINC, Apply 1 Application topically 2 (two) times a week., Disp: , Rfl:    latanoprost (XALATAN) 0.005 % ophthalmic solution, Place 1 drop into both eyes at bedtime., Disp: , Rfl:    Multiple Vitamins-Minerals (EMERGEN-C IMMUNE PLUS) PACK, Take 1 packet by mouth daily., Disp: , Rfl:    nicotine (NICODERM CQ - DOSED IN MG/24 HOURS) 14 mg/24hr patch, Place 14 mg onto the skin daily as needed (nicotine dependence)., Disp: , Rfl:    Omega-3 Fatty Acids (SALMON OIL PO), Take 1 capsule by mouth 3 (three) times a week., Disp: , Rfl:    phenol (CHLORASEPTIC) 1.4 % LIQD, Use as directed 2 sprays in the mouth or throat as needed for throat irritation / pain., Disp: , Rfl:    predniSONE (DELTASONE) 20 MG tablet, Take 2 tablets (40 mg total) by mouth daily with breakfast., Disp: 10 tablet, Rfl: 0   sacubitril-valsartan (ENTRESTO) 49-51 MG, Take 0.5 tablets by mouth daily., Disp: , Rfl:    Vitamin D, Ergocalciferol, (DRISDOL) 1.25 MG (50000 UNIT) CAPS capsule, Take 50,000 Units by mouth every Monday., Disp: , Rfl: :   Allergies  Allergen Reactions   Codeine Other (See Comments)    White spots on throat, Looks like strep throat (allergic reaction)  :  No family history on file.:   Social History   Socioeconomic History   Marital status: Married    Spouse name: Not on file   Number of children: Not on file   Years of education: Not on file   Highest education level: Not on file  Occupational History   Not on file  Tobacco Use   Smoking status: Every Day    Packs/day: 1.00    Years: 60.00    Total pack years: 60.00    Types: Cigarettes   Smokeless tobacco: Never   Tobacco comments:    Smoking 10 packs of cigarettes a day. ARJ 02/16/22  Vaping Use   Vaping Use: Former    Devices: Did a trial study for vapes  Substance and Sexual Activity   Alcohol use: Yes    Comment: occasional   Drug use: Never   Sexual activity: Not Currently    Birth control/protection: Post-menopausal  Other Topics Concern   Not on file  Social History Narrative   Not on file   Social Determinants of Health   Financial Resource Strain: Not on file  Food Insecurity: Not on file  Transportation Needs: Not on file  Physical Activity: Not on file  Stress: Not on file  Social Connections: Not on file  Intimate Partner Violence: Not on file  :  REVIEW OF SYSTEMS:  A 15 point review of systems is documented in the electronic medical record. This was obtained by the nursing staff. However, I reviewed this with the patient to discuss relevant findings and make appropriate changes. Positive for productive cough and mild hemoptysis. Negative for chest pain, fever, or chills.   PHYSICAL EXAM:  In general this is a well appearing female woman in no acute distress. She's alert and oriented x4 and appropriate throughout the examination. Cardiopulmonary assessment is negative for acute distress and she exhibits normal effort.     KPS = 90  100 - Normal; no complaints; no evidence of disease. 90   - Able to carry on normal activity; minor signs or symptoms of disease. 80   - Normal activity with effort; some signs or symptoms of disease. 54   - Cares for self; unable to carry on normal activity or to do active work. 60   - Requires occasional assistance, but is able to care for most of his personal needs. 50   - Requires considerable assistance and frequent medical care. 1   - Disabled; requires special care and assistance. 5   - Severely disabled; hospital admission is indicated although death not imminent. 32   - Very sick; hospital admission necessary; active supportive treatment necessary. 10   - Moribund; fatal processes progressing rapidly. 0     - Dead  Karnofsky DA, [place]mann  [place], Craver LS and [facility] [phone]) The use of the nitrogen mustards in the palliative treatment of carcinoma: with particular reference to bronchogenic carcinoma Cancer 1 [id number]  LABORATORY DATA:  Lab Results  Component Value Date   WBC 7.9 02/17/2022   HGB 13.8 02/17/2022   HCT 40.4 02/17/2022   MCV 90.4 02/17/2022   PLT 271 02/17/2022   Lab Results  Component Value Date   NA 139 02/17/2022   K 3.7 02/17/2022   CL 105 02/17/2022   CO2 23 02/17/2022   Lab Results  Component Value Date   ALT 23 12/15/2015   AST 27 12/15/2015   ALKPHOS 75 12/15/2015   BILITOT 0.7 12/15/2015     RADIOGRAPHY: DG Chest Port 1 View  Result Date: 02/17/2022 CLINICAL DATA:  Post bronchoscopy with biopsy. EXAM: PORTABLE CHEST 1 VIEW COMPARISON:  Radiographs 01/06/2022.  CT 12/05/2021. FINDINGS: 1311 hours. Mild patient rotation to the right. The heart size and mediastinal contours are stable. Known right lower lobe paraspinal mass projecting inferior to the hilum is stable. The lungs are otherwise clear. No pleural effusion or pneumothorax. No acute osseous findings are evident. Telemetry leads overlie the chest. IMPRESSION: No evidence of pneumothorax following bronchoscopy. Stable right lower lobe paraspinal mass. Electronically Signed   By: Richardean Sale M.D.   On: 02/17/2022 13:23   DG C-ARM BRONCHOSCOPY  Result Date: 02/17/2022 C-ARM BRONCHOSCOPY: Fluoroscopy was utilized by the requesting physician.  No radiographic interpretation.      IMPRESSION: 71 yo woman with a 3.8 cm non-small cell carcinoma of the right lower lobe of the lung - Stage IB (cT2a, cN0, cM0)   Today we discussed the workup and natural course of early stage non small cell lung cancer, highlighting the role of radiotherapy in the management. Patient is a good candidate for radiotherapy. I recommend 5 fractions of stereotactic body radiation therapy to the right lung nodule. We discussed the available radiation techniques,  and  focused on the details and logistics of delivery. Patient is aware that this treatment is of curative intent. We discussed and outlined the risks, benefits, short and long-term effects associated with radiotherapy. She and her husband were encouraged to ask questions that were answered to her stated satisfaction.   Of note, her back pain is consistent with the area of the tumor. It is difficult to tell if the tumor is invading the spine based off of CT. If pain does not improve after radiation, we can reevaluate with further imaging.    PLAN: 5 fractions of SBRT to the right lung nodule. Consent form was signed today. CT simulation schedule for 02/25/22. Radiotherapy will tentatively begin early next week.     I personally spent 60 minutes in this encounter including chart review, reviewing radiological studies, meeting face-to-face with the patient, entering orders and completing documentation.    ------------------------------------------------   Leona Singleton, PA-C    Tyler Pita, MD  [place]: [phone]  Fax: [phone] [email or web address]  Skype  [email or web address]    Page Me

## 2022-02-23 NOTE — Progress Notes (Signed)
Thoracic Location of Tumor / Histology: Right lower lobe nodule  09/04/2021 Dr. Criss Rosales CT Chest Lung Cancer Screening CLINICAL DATA:  Lung cancer screening. Twenty-two pack-year history.  Current asymptomatic smoker.  FINDINGS: Cardiovascular: Heart size is normal. No pericardial effusion.  Aortic atherosclerosis. Coronary artery calcifications.   Mediastinum/Nodes: No enlarged supraclavicular, axillary, or mediastinal lymph nodes. Trachea, thyroid gland, and esophagus are unremarkable.   Lungs/Pleura: Moderate to severe paraseptal and centrilobular emphysema. Multiple pulmonary nodules are identified. The largest nodule appears peripherally spiculated within the subpleural, paravertebral aspect of the medial right lower lobe with a mean derived diameter of 26.5 mm, image 160/4.   Upper Abdomen: No acute abnormality. Aortic atherosclerosis. Right adrenal gland adenoma measures 1.5 cm and -4 Hounsfield units. Left adrenal nodule measures 2.6 cm and -4 Hounsfield units unchanged from the previous exam compatible with a benign adenoma. No follow-up imaging of these benign adenomas recommended.   Musculoskeletal: No chest wall mass or suspicious bone lesions identified.   IMPRESSION: 1. Lung-RADS 4B, suspicious. Additional imaging evaluation or consultation with Pulmonology or Thoracic Surgery recommended. Right lower lobe, 26.5 mm, image 60/4. 2. Aortic Atherosclerosis (ICD10-I70.0) and Emphysema (ICD10-J43.9). 3. Coronary artery calcifications 4. These results will be called to the ordering clinician or representative by the Radiologist Assistant, and communication documented in the PACS or [employer].   12/04/2021 Dr. Valeta Harms NM PET Image (PI) Skull Base To Thigh CLINICAL DATA:  Initial treatment strategy for right lower lobe lung nodule on lung cancer screening CT.   COMPARISON:  Chest CT 09/04/2021.  Abdominopelvic CT 12/15/2015.  FINDINGS: Mediastinal blood pool activity: SUV  max 1.9 Liver activity: SUV max NA   NECK: No areas of abnormal hypermetabolism.   Incidental CT findings: Hypoplastic or absent right submandibular gland. No cervical adenopathy.   CHEST: No thoracic nodal hypermetabolism. Hypermetabolism corresponding to the pleural-based right lower lobe lung nodule.  Example 2.6 x 1.8 cm and a S.U.V. max of 6.0 on 109/3. Relatively similar in size to 09/04/2021.   Incidental CT findings: Moderate centrilobular and paraseptal emphysema. Advanced aortic and branch vessel atherosclerosis.   ABDOMEN/PELVIS: No abdominopelvic parenchymal or nodal hypermetabolism.   Incidental CT findings: Left greater than right low-density adrenal enlargement and nodularity consistent with bilateral adrenal adenomas. Maximally 2.5 cm on the left. In the absence of clinically indicated signs/symptoms require(s) no independent follow-up. An upper pole left renal 7 mm lesion is hyperattenuating into small to entirely characterize, but favored to represent a hemorrhagic/proteinaceous cyst. Normal noncontrast appearance of the liver, pancreas, right kidney, gallbladder, uterus. Normal stomach and bowel loops. No bladder calculi.   SKELETON: No abnormal marrow activity. Incidental CT findings: Osteopenia.  Bilateral L5 pars defects.   IMPRESSION: 1. Hypermetabolic right lower lobe 2.6 cm pulmonary nodule, most consistent with primary bronchogenic carcinoma. No evidence of thoracic nodal or distant metastatic disease. Presuming non-small-cell histology, T1cN0M0 or stage IA. 2. Incidental findings, including: Aortic atherosclerosis (ICD10-I70.0) and emphysema (ICD10-J43.9). Bilateral adrenal adenomas.   12/05/2021 Dr. Valeta Harms CT Super D Chest without Contrast CLINICAL DATA:  71 year old female under preoperative evaluation.  Pulmonary nodule.  COMPARISON:  Chest CT 09/04/2021.  PET-CT 12/04/2021.   FINDINGS: Cardiovascular: Heart size is normal. There is no significant  pericardial fluid, thickening or pericardial calcification. There is aortic atherosclerosis, as well as atherosclerosis of the great vessels of the mediastinum and the coronary arteries, including calcified atherosclerotic plaque in the left anterior descending coronary artery.   Mediastinum/Nodes: No pathologically enlarged mediastinal or hilar lymph nodes.  Please note that accurate exclusion of hilar adenopathy is limited on noncontrast CT scans. Esophagus is unremarkable in appearance. No axillary lymphadenopathy.   Lungs/Pleura: In the medial aspect of the right lower lobe (axial image 81 of series 4 and coronal image 94 of series 6) there is a pleural-based 2.9 x 1.9 x 3.8 cm mass with macrolobulated slightly spiculated margins and postobstructive changes in the inferomedial aspect of the right lower lobe adjacent to the lesion. These findings are very similar to prior examinations. No other new suspicious appearing pulmonary nodules or masses are noted. No pleural effusions. Diffuse bronchial wall thickening with moderate centrilobular and paraseptal emphysema.   Upper Abdomen: Low-attenuation nodules in the adrenal glands bilaterally, similar to prior studies, compatible with benign adenomas, largest of which measures up to 3.3 x 1.6 cm on the left.  Aortic atherosclerosis.   Musculoskeletal: There are no aggressive appearing lytic or blastic lesions noted in the visualized portions of the skeleton.   IMPRESSION: 1. 2.9 x 1.9 x 3.8 cm mass in the medial right lower lobe, previously hypermetabolic on PET-CT 79/89/2119, highly concerning for primary bronchogenic carcinoma. 2. No definite lymphadenopathy. 3. Diffuse bronchial wall thickening with moderate centrilobular and paraseptal emphysema; imaging findings suggestive of underlying COPD. 4. Aortic atherosclerosis, in addition to left anterior descending coronary artery disease. Assessment for potential risk factor modification, dietary  therapy or pharmacologic therapy may be warranted, if clinically indicated. 5. Bilateral adrenal adenomas. Aortic Atherosclerosis (ICD10-I70.0) and Emphysema (ICD10-J43.9).    01/06/2022 Dr. Elie Confer DG Chest 2 View CLINICAL DATA:  71 year old female with upper respiratory illness.  Known mass of the right lung.  COMPARISON:  CT 12/05/2021   FINDINGS: Cardiomediastinal silhouette within normal limits.   No interlobular septal thickening or pulmonary vascular congestion.   No pneumothorax or pleural effusion.  No confluent airspace disease.   The pleural based mass of the right lower lobe better demonstrated on prior CT.   Degenerative changes the spine. Scoliotic curvature. No displaced fracture.   IMPRESSION: Negative for acute cardiopulmonary disease Pleural base mass of the right lower lobe better demonstrated on prior CT/PET CT   02/17/2022 Dr. Lewis Shock Chest Port 1 View CLINICAL DATA:  Post bronchoscopy with biopsy.   COMPARISON:  Radiographs 01/06/2022.  CT 12/05/2021.   FINDINGS: 1311 hours. Mild patient rotation to the right. The heart size and mediastinal contours are stable. Known right lower lobe paraspinal mass projecting inferior to the hilum is stable. The lungs are otherwise clear. No pleural effusion or pneumothorax. No acute osseous findings are evident. Telemetry leads overlie the chest.   IMPRESSION: No evidence of pneumothorax following bronchoscopy. Stable right lower lobe paraspinal mass.  Tobacco/Marijuana/Snuff/ETOH use: smoker, no drug or smokeless tobacco, occasional alcohol.  Past/Anticipated interventions by cardiothoracic surgery, if any:   02/17/2022 Dr. Valeta Harms Flexible video fiberoptic bronchoscopy with robotic assistance and biopsies   Past/Anticipated interventions by medical oncology, if any: NA  Signs/Symptoms Weight changes, if any: No Respiratory complaints, if any: SOB  Hemoptysis, if any: Spit up streak of blood this  morning. Pain issues, if any:  Lower back and right flank 5/10 pain.  SAFETY ISSUES: Prior radiation?  No Pacemaker/ICD?  No Possible current pregnancy?  Postmenopausal Is the patient on methotrexate?  No  Current Complaints / other details:   Need more information about the treatment options.

## 2022-02-24 LAB — CYTOLOGY - NON PAP

## 2022-02-25 ENCOUNTER — Other Ambulatory Visit: Payer: Self-pay

## 2022-02-25 ENCOUNTER — Ambulatory Visit
Admission: RE | Admit: 2022-02-25 | Discharge: 2022-02-25 | Disposition: A | Discharge status: Home / Self Care | Payer: Medicare Other | Source: Ambulatory Visit | Attending: Radiation Oncology | Admitting: Radiation Oncology

## 2022-02-25 DIAGNOSIS — F17210 Nicotine dependence, cigarettes, uncomplicated: Secondary | ICD-10-CM | POA: Diagnosis not present

## 2022-02-25 DIAGNOSIS — Z510 Encounter for antineoplastic radiation therapy: Secondary | ICD-10-CM | POA: Diagnosis not present

## 2022-02-25 DIAGNOSIS — C3431 Malignant neoplasm of lower lobe, right bronchus or lung: Secondary | ICD-10-CM | POA: Insufficient documentation

## 2022-02-25 NOTE — Progress Notes (Signed)
  Radiation Oncology         [phone] ________________________________  Name: Carla Lane MRN: [id number]  Date: 02/25/2022  DOB: Jan 09, 1951  STEREOTACTIC BODY RADIOTHERAPY SIMULATION AND TREATMENT PLANNING NOTE    ICD-10-CM   1. Primary cancer of right lower lobe of lung (HCC)  C34.31       DIAGNOSIS:  71 yo woman with a 3.8 cm non-small cell carcinoma of the right lower lobe of the lung - Stage IB (cT2a, cN0, cM0)    NARRATIVE:  The patient was brought to the [facility].  Identity was confirmed.  All relevant records and images related to the planned course of therapy were reviewed.  The patient freely provided informed written consent to proceed with treatment after reviewing the details related to the planned course of therapy. The consent form was witnessed and verified by the simulation staff.  Then, the patient was set-up in a stable reproducible  supine position for radiation therapy.  A BodyFix immobilization pillow was fabricated for reproducible positioning.  Then I personally applied the abdominal compression paddle to limit respiratory excursion.  4D respiratoy motion management CT images were obtained.  Surface markings were placed.  The CT images were loaded into the planning software.  Then, using Cine, MIP, and standard views, the internal target volume (ITV) and planning target volumes (PTV) were delinieated, and avoidance structures were contoured.  Treatment planning then occurred.  The radiation prescription was entered and confirmed.  A total of two complex treatment devices were fabricated in the form of the BodyFix immobilization pillow and a neck accuform cushion.  I have requested : 3D Simulation  I have requested a DVH of the following structures: Heart, Lungs, Esophagus, Chest Wall, Brachial Plexus, Major Blood Vessels, and targets.  SPECIAL TREATMENT PROCEDURE:  The planned course of therapy using radiation constitutes a special treatment  procedure. Special care is required in the management of this patient for the following reasons. This treatment constitutes a Special Treatment Procedure for the following reason: [ High dose per fraction requiring special monitoring for increased toxicities of treatment including daily imaging..  The special nature of the planned course of radiotherapy will require increased physician supervision and oversight to ensure patient's safety with optimal treatment outcomes.  This requires extended time and effort.    RESPIRATORY MOTION MANAGEMENT SIMULATION:  In order to account for effect of respiratory motion on target structures and other organs in the planning and delivery of radiotherapy, this patient underwent respiratory motion management simulation.  To accomplish this, when the patient was brought to the CT simulation planning suite, 4D respiratoy motion management CT images were obtained.  The CT images were loaded into the planning software.  Then, using a variety of tools including Cine, MIP, and standard views, the target volume and planning target volumes (PTV) were delineated.  Avoidance structures were contoured.  Treatment planning then occurred.  Dose volume histograms were generated and reviewed for each of the requested structure.  The resulting plan was carefully reviewed and approved today.  PLAN:  The patient will receive 60 Gy in 5 fractions.  ________________________________  Sheral Apley Tammi Klippel, M.D.

## 2022-02-27 ENCOUNTER — Encounter: Payer: Self-pay | Admitting: Adult Health

## 2022-02-27 ENCOUNTER — Other Ambulatory Visit: Payer: Self-pay

## 2022-02-27 ENCOUNTER — Ambulatory Visit (INDEPENDENT_AMBULATORY_CARE_PROVIDER_SITE_OTHER): Payer: Medicare Other | Admitting: Adult Health

## 2022-02-27 VITALS — BP 138/62 | HR 92 | Ht 64.0 in | Wt 120.6 lb

## 2022-02-27 DIAGNOSIS — J449 Chronic obstructive pulmonary disease, unspecified: Secondary | ICD-10-CM | POA: Diagnosis not present

## 2022-02-27 DIAGNOSIS — R911 Solitary pulmonary nodule: Secondary | ICD-10-CM

## 2022-02-27 DIAGNOSIS — C3431 Malignant neoplasm of lower lobe, right bronchus or lung: Secondary | ICD-10-CM | POA: Diagnosis not present

## 2022-02-27 MED ORDER — SPIRIVA RESPIMAT 2.5 MCG/ACT IN AERS: 2.0000 | INHALATION_SPRAY | Freq: Every day | RESPIRATORY_TRACT | 5 refills | Status: DC

## 2022-02-27 NOTE — Progress Notes (Signed)
Patient seen in the office today and instructed on use of Spiriva 2.31mcg.  Patient expressed understanding and demonstrated technique.  Carla Lane [facility] 02/27/22

## 2022-02-27 NOTE — Assessment & Plan Note (Signed)
Right lower lobe non-small cell lung cancer-stage 1 B -patient has been seen by radiation oncology and will beginning SBRT March 11, 2022. Patient is continue follow-up with radiation oncology

## 2022-02-27 NOTE — Patient Instructions (Addendum)
Begin Zyrtec 5mg  At bedtime  As needed  drainage  Saline nasal spray Twice daily   Work on not smoking  Delsym 2 tsp Twice daily  As needed  cough.  Try Spiriva 2 puffs daily .  Follow up in 6 weeks with Dr,. Icard with PFT and As needed   Continue  follow up for Radiation Oncology .  Please contact office for sooner follow up if symptoms do not improve or worsen or seek emergency care

## 2022-02-27 NOTE — Progress Notes (Signed)
@Patient  ID: Carla Lane, female    DOB: 20-Aug-1950, 71 y.o.   MRN: [id number]  Chief Complaint  Patient presents with   Follow-up    Referring provider: Lucianne Lei, MD  HPI: 71 year old female active smoker seen for pulmonary consult November 26, 2021 for right lower lobe lung nodule with workup revealing non-small cell lung cancer Participates in the lung cancer screening program  TEST/EVENTS :  CT chest September 04, 2021 moderate to severe emphysema, multiple pulmonary nodules largest nodule right lower lobe 26.5 mm.  PET scan December 04, 2021 showed hypermetabolic right lower lobe nodule 2.6 cm consistent with primary bronchogenic carcinoma.  No evidence of thoracic nodal or distant metastatic disease.    Super D CT chest December 05, 2021 showed a pleural-based 2.9 x 1.9 x 3.8 cm mass  Bronchoscopy February 17, 2022 cytology positive for malignant cells consistent with non-small cell carcinoma  02/27/2022 Follow up : Lung cancer , Emphysema  Patient presents for a follow-up visit.  She was seen in September for a pulmonary consult for lung nodule.  This was found on lung cancer CT screening program.  Initial CT showed a 26 mm right lower lobe nodule.  Subsequent PET scan December 04, 2021 showed hypermetabolic right lower lobe nodule measuring 2.6 cm consistent with primary carcinoma.  No evidence of distant disease.  Patient underwent bronchoscopy on December 5 with cytology positive for malignant cells.  Lymph node sampling was negative . patient was referred to radiation oncology.  She was seen by Dr. Tammi Klippel on February 23, 2022 , plans to start 12/27 . Did okay after Bronchoscopy, only 1 episode of blood tinged mucus.   Complains over last 3 months she has had ongoing cough, congestion, nasal congestion and drainage. Has been treated initially with Amoxicillin . Then 1 month later was treated with Doxycycline and steroid taper . Says she gets better briefly then symptoms  returns.  Has been tried on inhaler in past. Tried Advair in past not sure if she tolerated.  Says she is very intolerant to medications .  Minimally productive cough with clear mucus, and daily nasal congestion   Had severe facial/nasal  injury as young child ,.   Says she active,denies significant shortness of breath.   Allergies  Allergen Reactions   Codeine Other (See Comments)    White spots on throat, Looks like strep throat (allergic reaction)    Immunization History  Administered Date(s) Administered   PFIZER(Purple Top)SARS-COV-2 Vaccination 07/22/2019, 08/15/2019    Past Medical History:  Diagnosis Date   Anemia    during menstruating years   Anxiety    Arthritis    Cancer ([place])    squamous (tip of nose) Basal - top of shoulder, top of each ears and top of forehead   Cataracts, bilateral 05/2017   Colon polyps    Depression    Dyspnea 11/2021   GERD (gastroesophageal reflux disease)    Hypertension    does not routinely take her blood pressure medications every day.   Pneumonia    Primary open angle glaucoma 10/2015   Bilateral   Rapid heart rate    during pre-menopause - thinks it was due to a HTN medication   Restless leg syndrome 07/16/2020   Right lower lobe lung mass 09/04/2021    Tobacco History: Social History   Tobacco Use  Smoking Status Every Day   Packs/day: 1.00   Years: 60.00   Total pack years: 60.00   Types:  Cigarettes  Smokeless Tobacco Never  Tobacco Comments   Smoking 10 packs of cigarettes a day. ARJ 02/16/22   Ready to quit: No Counseling given: Yes Tobacco comments: Smoking 10 packs of cigarettes a day. ARJ 02/16/22   Outpatient Medications Prior to Visit  Medication Sig Dispense Refill   albuterol (VENTOLIN HFA) 108 (90 Base) MCG/ACT inhaler Inhale 2 puffs into the lungs every 6 (six) hours as needed for wheezing or shortness of breath. 8 g 6   aspirin EC 325 MG tablet Take 325 mg by mouth every 6 (six) hours as needed for  moderate pain.     COLLAGEN PO Take 1 Scoop by mouth 3 (three) times a week.     Iodine TINC Apply 1 Application topically 2 (two) times a week.     latanoprost (XALATAN) 0.005 % ophthalmic solution Place 1 drop into both eyes at bedtime.     Multiple Vitamins-Minerals (EMERGEN-C IMMUNE PLUS) PACK Take 1 packet by mouth daily.     nicotine (NICODERM CQ - DOSED IN MG/24 HOURS) 14 mg/24hr patch Place 14 mg onto the skin daily as needed (nicotine dependence).     Omega-3 Fatty Acids (SALMON OIL PO) Take 1 capsule by mouth 3 (three) times a week.     phenol (CHLORASEPTIC) 1.4 % LIQD Use as directed 2 sprays in the mouth or throat as needed for throat irritation / pain.     sacubitril-valsartan (ENTRESTO) 49-51 MG Take 0.5 tablets by mouth daily.     Vitamin D, Ergocalciferol, (DRISDOL) 1.25 MG (50000 UNIT) CAPS capsule Take 50,000 Units by mouth every Monday.     doxycycline (VIBRA-TABS) 100 MG tablet Take 1 tablet (100 mg total) by mouth 2 (two) times daily. 14 tablet 0   predniSONE (DELTASONE) 20 MG tablet Take 2 tablets (40 mg total) by mouth daily with breakfast. 10 tablet 0   No facility-administered medications prior to visit.     Review of Systems:   Constitutional:   No  weight loss, night sweats,  Fevers, chills, fatigue, or  lassitude.  HEENT:   No headaches,  Difficulty swallowing,  Tooth/dental problems, or  Sore throat,                No sneezing, itching, ear ache,  +nasal congestion, post nasal drip,   CV:  No chest pain,  Orthopnea, PND, swelling in lower extremities, anasarca, dizziness, palpitations, syncope.   GI  No heartburn, indigestion, abdominal pain, nausea, vomiting, diarrhea, change in bowel habits, loss of appetite, bloody stools.   Resp:  No chest wall deformity  Skin: no rash or lesions.  GU: no dysuria, change in color of urine, no urgency or frequency.  No flank pain, no hematuria   MS:  No joint pain or swelling.  No decreased range of motion.  No back  pain.    Physical Exam  BP 138/62 (BP Location: Right Arm, Patient Position: Sitting, Cuff Size: Normal)   Pulse 92   Ht 5\' 4"  (1.626 m)   Wt 120 lb 9.6 oz (54.7 kg)   SpO2 98% Comment: on RA  BMI 20.70 kg/m   GEN: A/Ox3; pleasant , NAD, well nourished    HEENT:  [place]/AT,  NOSE-clear drainage  THROAT-clear, no lesions, no postnasal drip or exudate noted.   NECK:  Supple w/ fair ROM; no JVD; normal carotid impulses w/o bruits; no thyromegaly or nodules palpated; no lymphadenopathy.    RESP  Clear  P & A; w/o, wheezes/ rales/  or rhonchi. no accessory muscle use, no dullness to percussion  CARD:  RRR, no m/r/g, no peripheral edema, pulses intact, no cyanosis or clubbing.  GI:   Soft & nt; nml bowel sounds; no organomegaly or masses detected.   Musco: Warm bil, no deformities or joint swelling noted.   Neuro: alert, no focal deficits noted.    Skin: Warm, no lesions or rashes    Lab Results:  CBC  BNP No results found for: "BNP"  ProBNP No results found for: "PROBNP"  Imaging: DG Chest Port 1 View  Result Date: 02/17/2022 CLINICAL DATA:  Post bronchoscopy with biopsy. EXAM: PORTABLE CHEST 1 VIEW COMPARISON:  Radiographs 01/06/2022.  CT 12/05/2021. FINDINGS: 1311 hours. Mild patient rotation to the right. The heart size and mediastinal contours are stable. Known right lower lobe paraspinal mass projecting inferior to the hilum is stable. The lungs are otherwise clear. No pleural effusion or pneumothorax. No acute osseous findings are evident. Telemetry leads overlie the chest. IMPRESSION: No evidence of pneumothorax following bronchoscopy. Stable right lower lobe paraspinal mass. Electronically Signed   By: Richardean Sale M.D.   On: 02/17/2022 13:23   DG C-ARM BRONCHOSCOPY  Result Date: 02/17/2022 C-ARM BRONCHOSCOPY: Fluoroscopy was utilized by the requesting physician.  No radiographic interpretation.          No data to display          No results found for:  "NITRICOXIDE"      Assessment & Plan:   COPD (chronic obstructive pulmonary disease) ([place]) Presumed COPD with long standing smoking history . Emphsyema on CT .  Daily symptoms of cough and nasal congestion. No fever or discolored mucus  Hold on additional antibiotics and steroids at this time  Smoking cessation recommended  Trial of LAMA inhaler.  Check PFT on return.  Control for triggers - chronic rhinitis  Consider Entresto (could be aggravating cough )   Plan  Patient Instructions  Begin Zyrtec 5mg  At bedtime  As needed  drainage  Saline nasal spray Twice daily   Work on not smoking  Delsym 2 tsp Twice daily  As needed  cough.  Try Spiriva 2 puffs daily .  Follow up in 6 weeks with Dr,. Icard with PFT and As needed   Continue  follow up for Radiation Oncology .  Please contact office for sooner follow up if symptoms do not improve or worsen or seek emergency care       Primary cancer of right lower lobe of lung ([place]) Right lower lobe non-small cell lung cancer-stage 1 B -patient has been seen by radiation oncology and will beginning SBRT March 11, 2022. Patient is continue follow-up with radiation oncology     Rexene Edison, NP 02/27/2022

## 2022-02-27 NOTE — Assessment & Plan Note (Addendum)
Presumed COPD with long standing smoking history . Emphsyema on CT .  Daily symptoms of cough and nasal congestion. No fever or discolored mucus  Hold on additional antibiotics and steroids at this time  Smoking cessation recommended  Trial of LAMA inhaler.  Check PFT on return.  Control for triggers - chronic rhinitis  Consider Entresto (could be aggravating cough )   Plan  Patient Instructions  Begin Zyrtec 5mg  At bedtime  As needed  drainage  Saline nasal spray Twice daily   Work on not smoking  Delsym 2 tsp Twice daily  As needed  cough.  Try Spiriva 2 puffs daily .  Follow up in 6 weeks with Dr,. Icard with PFT and As needed   Continue  follow up for Radiation Oncology .  Please contact office for sooner follow up if symptoms do not improve or worsen or seek emergency care

## 2022-02-27 NOTE — Progress Notes (Signed)
The proposed treatment discussed in conference is for discussion purpose only and is not a binding recommendation.  The patients have not been physically examined, or presented with their treatment options.  Therefore, final treatment plans cannot be decided.  

## 2022-03-02 DIAGNOSIS — F17210 Nicotine dependence, cigarettes, uncomplicated: Secondary | ICD-10-CM | POA: Diagnosis not present

## 2022-03-02 DIAGNOSIS — C3431 Malignant neoplasm of lower lobe, right bronchus or lung: Secondary | ICD-10-CM | POA: Diagnosis not present

## 2022-03-03 DIAGNOSIS — C3431 Malignant neoplasm of lower lobe, right bronchus or lung: Secondary | ICD-10-CM | POA: Diagnosis not present

## 2022-03-03 DIAGNOSIS — F17210 Nicotine dependence, cigarettes, uncomplicated: Secondary | ICD-10-CM | POA: Diagnosis not present

## 2022-03-05 ENCOUNTER — Encounter (HOSPITAL_COMMUNITY): Payer: Self-pay

## 2022-03-10 ENCOUNTER — Ambulatory Visit: Payer: Medicare Other | Admitting: Radiation Oncology

## 2022-03-10 DIAGNOSIS — F17210 Nicotine dependence, cigarettes, uncomplicated: Secondary | ICD-10-CM | POA: Diagnosis not present

## 2022-03-10 DIAGNOSIS — Z510 Encounter for antineoplastic radiation therapy: Secondary | ICD-10-CM | POA: Diagnosis not present

## 2022-03-10 DIAGNOSIS — C3431 Malignant neoplasm of lower lobe, right bronchus or lung: Secondary | ICD-10-CM | POA: Diagnosis not present

## 2022-03-11 ENCOUNTER — Other Ambulatory Visit: Payer: Self-pay

## 2022-03-11 ENCOUNTER — Ambulatory Visit
Admission: RE | Admit: 2022-03-11 | Discharge: 2022-03-11 | Disposition: A | Discharge status: Home / Self Care | Payer: Medicare Other | Source: Ambulatory Visit | Attending: Radiation Oncology | Admitting: Radiation Oncology

## 2022-03-11 DIAGNOSIS — C3431 Malignant neoplasm of lower lobe, right bronchus or lung: Secondary | ICD-10-CM

## 2022-03-11 DIAGNOSIS — Z510 Encounter for antineoplastic radiation therapy: Secondary | ICD-10-CM | POA: Diagnosis not present

## 2022-03-11 DIAGNOSIS — F17210 Nicotine dependence, cigarettes, uncomplicated: Secondary | ICD-10-CM | POA: Diagnosis not present

## 2022-03-11 LAB — RAD ONC ARIA SESSION SUMMARY
Course Elapsed Days: 0
Plan Fractions Treated to Date: 1
Plan Prescribed Dose Per Fraction: 12 Gy
Plan Total Fractions Prescribed: 5
Plan Total Prescribed Dose: 60 Gy
Reference Point Dosage Given to Date: 12 Gy
Reference Point Session Dosage Given: 12 Gy
Session Number: 1

## 2022-03-12 ENCOUNTER — Ambulatory Visit: Payer: Medicare Other | Admitting: Radiation Oncology

## 2022-03-12 DIAGNOSIS — C3490 Malignant neoplasm of unspecified part of unspecified bronchus or lung: Secondary | ICD-10-CM | POA: Diagnosis not present

## 2022-03-13 ENCOUNTER — Other Ambulatory Visit: Payer: Self-pay

## 2022-03-13 ENCOUNTER — Ambulatory Visit
Admission: RE | Admit: 2022-03-13 | Discharge: 2022-03-13 | Disposition: A | Discharge status: Home / Self Care | Payer: Medicare Other | Source: Ambulatory Visit | Attending: Radiation Oncology | Admitting: Radiation Oncology

## 2022-03-13 ENCOUNTER — Ambulatory Visit: Admission: RE | Admit: 2022-03-13 | Payer: Medicare Other | Source: Ambulatory Visit

## 2022-03-13 DIAGNOSIS — Z510 Encounter for antineoplastic radiation therapy: Secondary | ICD-10-CM | POA: Diagnosis not present

## 2022-03-13 DIAGNOSIS — C3431 Malignant neoplasm of lower lobe, right bronchus or lung: Secondary | ICD-10-CM | POA: Diagnosis not present

## 2022-03-13 DIAGNOSIS — F17210 Nicotine dependence, cigarettes, uncomplicated: Secondary | ICD-10-CM | POA: Diagnosis not present

## 2022-03-13 LAB — RAD ONC ARIA SESSION SUMMARY
Course Elapsed Days: 2
Plan Fractions Treated to Date: 2
Plan Prescribed Dose Per Fraction: 12 Gy
Plan Total Fractions Prescribed: 5
Plan Total Prescribed Dose: 60 Gy
Reference Point Dosage Given to Date: 24 Gy
Reference Point Session Dosage Given: 12 Gy
Session Number: 2

## 2022-03-17 ENCOUNTER — Ambulatory Visit
Admission: RE | Admit: 2022-03-17 | Discharge: 2022-03-17 | Disposition: A | Discharge status: Home / Self Care | Payer: Medicare Other | Source: Ambulatory Visit | Attending: Radiation Oncology | Admitting: Radiation Oncology

## 2022-03-17 ENCOUNTER — Other Ambulatory Visit: Payer: Self-pay

## 2022-03-17 DIAGNOSIS — C3431 Malignant neoplasm of lower lobe, right bronchus or lung: Secondary | ICD-10-CM | POA: Diagnosis not present

## 2022-03-17 LAB — RAD ONC ARIA SESSION SUMMARY
Course Elapsed Days: 6
Plan Fractions Treated to Date: 3
Plan Prescribed Dose Per Fraction: 12 Gy
Plan Total Fractions Prescribed: 5
Plan Total Prescribed Dose: 60 Gy
Reference Point Dosage Given to Date: 36 Gy
Reference Point Session Dosage Given: 12 Gy
Session Number: 3

## 2022-03-19 ENCOUNTER — Ambulatory Visit
Admission: RE | Admit: 2022-03-19 | Discharge: 2022-03-19 | Disposition: A | Discharge status: Home / Self Care | Payer: Medicare Other | Source: Ambulatory Visit | Attending: Radiation Oncology | Admitting: Radiation Oncology

## 2022-03-19 ENCOUNTER — Other Ambulatory Visit: Payer: Self-pay

## 2022-03-19 ENCOUNTER — Encounter (HOSPITAL_COMMUNITY): Payer: Self-pay

## 2022-03-19 DIAGNOSIS — C3431 Malignant neoplasm of lower lobe, right bronchus or lung: Secondary | ICD-10-CM

## 2022-03-19 LAB — RAD ONC ARIA SESSION SUMMARY
Course Elapsed Days: 8
Plan Fractions Treated to Date: 4
Plan Prescribed Dose Per Fraction: 12 Gy
Plan Total Fractions Prescribed: 5
Plan Total Prescribed Dose: 60 Gy
Reference Point Dosage Given to Date: 48 Gy
Reference Point Session Dosage Given: 12 Gy
Session Number: 4

## 2022-03-23 ENCOUNTER — Encounter: Payer: Self-pay | Admitting: Urology

## 2022-03-23 ENCOUNTER — Other Ambulatory Visit: Payer: Self-pay

## 2022-03-23 ENCOUNTER — Ambulatory Visit
Admission: RE | Admit: 2022-03-23 | Discharge: 2022-03-23 | Disposition: A | Discharge status: Home / Self Care | Payer: Medicare Other | Source: Ambulatory Visit | Attending: Radiation Oncology | Admitting: Radiation Oncology

## 2022-03-23 DIAGNOSIS — C3431 Malignant neoplasm of lower lobe, right bronchus or lung: Secondary | ICD-10-CM | POA: Diagnosis not present

## 2022-03-23 DIAGNOSIS — F17210 Nicotine dependence, cigarettes, uncomplicated: Secondary | ICD-10-CM | POA: Diagnosis not present

## 2022-03-23 DIAGNOSIS — Z510 Encounter for antineoplastic radiation therapy: Secondary | ICD-10-CM | POA: Diagnosis not present

## 2022-03-23 LAB — RAD ONC ARIA SESSION SUMMARY
Course Elapsed Days: 12
Plan Fractions Treated to Date: 5
Plan Prescribed Dose Per Fraction: 12 Gy
Plan Total Fractions Prescribed: 5
Plan Total Prescribed Dose: 60 Gy
Reference Point Dosage Given to Date: 60 Gy
Reference Point Session Dosage Given: 12 Gy
Session Number: 5

## 2022-03-24 ENCOUNTER — Ambulatory Visit: Payer: Medicare Other | Admitting: Radiation Oncology

## 2022-04-07 DIAGNOSIS — H401133 Primary open-angle glaucoma, bilateral, severe stage: Secondary | ICD-10-CM | POA: Diagnosis not present

## 2022-04-15 ENCOUNTER — Encounter: Payer: Self-pay | Admitting: Pulmonary Disease

## 2022-04-15 ENCOUNTER — Ambulatory Visit (INDEPENDENT_AMBULATORY_CARE_PROVIDER_SITE_OTHER): Payer: Medicare Other | Admitting: Pulmonary Disease

## 2022-04-15 VITALS — BP 140/80 | HR 80 | Ht 65.0 in | Wt 121.8 lb

## 2022-04-15 DIAGNOSIS — F17200 Nicotine dependence, unspecified, uncomplicated: Secondary | ICD-10-CM

## 2022-04-15 DIAGNOSIS — C3431 Malignant neoplasm of lower lobe, right bronchus or lung: Secondary | ICD-10-CM

## 2022-04-15 DIAGNOSIS — Z923 Personal history of irradiation: Secondary | ICD-10-CM | POA: Diagnosis not present

## 2022-04-15 DIAGNOSIS — J449 Chronic obstructive pulmonary disease, unspecified: Secondary | ICD-10-CM

## 2022-04-15 DIAGNOSIS — R911 Solitary pulmonary nodule: Secondary | ICD-10-CM | POA: Diagnosis not present

## 2022-04-15 LAB — PULMONARY FUNCTION TEST
DL/VA % pred: 60 %
DL/VA: 2.48 ml/min/mmHg/L
DLCO cor % pred: 65 %
DLCO cor: 13 ml/min/mmHg
DLCO unc % pred: 65 %
DLCO unc: 13.15 ml/min/mmHg
FEF 25-75 Post: 1.14 L/sec
FEF 25-75 Pre: 0.36 L/sec
FEF2575-%Change-Post: 217 %
FEF2575-%Pred-Post: 61 %
FEF2575-%Pred-Pre: 19 %
FEV1-%Change-Post: 122 %
FEV1-%Pred-Post: 82 %
FEV1-%Pred-Pre: 37 %
FEV1-Post: 1.9 L
FEV1-Pre: 0.85 L
FEV1FVC-%Change-Post: 103 %
FEV1FVC-%Pred-Pre: 37 %
FEV6-%Change-Post: 33 %
FEV6-%Pred-Post: 109 %
FEV6-%Pred-Pre: 82 %
FEV6-Post: 3.2 L
FEV6-Pre: 2.4 L
FEV6FVC-%Change-Post: 17 %
FEV6FVC-%Pred-Post: 101 %
FEV6FVC-%Pred-Pre: 86 %
FVC-%Change-Post: 8 %
FVC-%Pred-Post: 108 %
FVC-%Pred-Pre: 99 %
FVC-Post: 3.29 L
FVC-Pre: 3.02 L
Post FEV1/FVC ratio: 58 %
Post FEV6/FVC ratio: 97 %
Pre FEV1/FVC ratio: 28 %
Pre FEV6/FVC Ratio: 83 %
RV % pred: 140 %
RV: 3.21 L
TLC % pred: 123 %
TLC: 6.44 L

## 2022-04-15 MED ORDER — BUPROPION HCL ER (SR) 150 MG PO TB12: 150.0000 mg | ORAL_TABLET | Freq: Two times a day (BID) | ORAL | 2 refills | Status: DC

## 2022-04-15 MED ORDER — TRELEGY ELLIPTA 100-62.5-25 MCG/ACT IN AEPB: 1.0000 | INHALATION_SPRAY | Freq: Every day | RESPIRATORY_TRACT | 1 refills | Status: AC

## 2022-04-15 NOTE — Progress Notes (Signed)
Smoking Cessation Counseling:   The patient's current tobacco use: Half pack per day The patient was advised to quit and impact of smoking on their health.  I assessed the patient's willingness to attempt to quit. I provided methods and skills for cessation. We reviewed medication management of smoking session drugs if appropriate. Resources to help quit smoking were provided. A smoking cessation quit date was set: 3 months Follow-up was arranged in our clinic.  The amount of time spent counseling patient was 6 mins   New prescription: Wellbutrin.  Garner Nash, DO [facility] 04/15/2022 10:49 AM

## 2022-04-15 NOTE — Progress Notes (Signed)
Full PFT completed today

## 2022-04-15 NOTE — Progress Notes (Signed)
Synopsis: Referred in September 2023 for lung mass by Lucianne Lei, MD  Subjective:   PATIENT ID: Carla Lane, Carla Lane  Chief Complaint  Patient presents with   Follow-up    F/up PFT    This is a 72 year old female, past medical history of hypertension, current every day smoker.Patient had a lung cancer screening CT on 09/04/2021 which was found to have a right lower lobe 26.5 mm lung nodule.  She was referred for further evaluation.  Patient has no respiratory complaints today.  She is retired from [employer].  Has lived all over the country.  She has a longstanding history of smoking for the last 60 years.  She states that she is intermittently compliant with her blood pressure medications.  She does not routinely take her blood pressure medications every day.  I told her that it is important for her to follow-up with her primary care doctor to discuss this.  OV 02/16/2022: Patient was initially seen in the office after having abnormal lung cancer screening CT imaging.  She was scheduled for bronchoscopy unfortunately she became sick her husband consent became sick we had to reschedule her bronchoscopy.  She still was not feeling better after several weeks we rescheduled her bronchoscopy for a second time.  She followed up in the office.  Decision was made to undergo procedure on 02/17/2022.  She is here today to discuss any questions that she may have regarding planned bronchoscopy since it has been several weeks due to rescheduling.  OV 04/15/2022: Patient here today for follow-up. Patient was referred to radiation oncology for completion of SBRT.  Patient was last seen in the office on 02/27/2022 after bronchoscopy.  She was given a LAMA inhaler at last office visit.  Evidence of emphysema on previous CT imaging.  She had PFTs completed prior to today's office visit.  Here today to review results of pulmonary function test.    Past Medical History:   Diagnosis Date   Anemia    during menstruating years   Anxiety    Arthritis    Cancer ([place])    squamous (tip of nose) Basal - top of shoulder, top of each ears and top of forehead   Cataracts, bilateral 05/2017   Colon polyps    Depression    Dyspnea 11/2021   GERD (gastroesophageal reflux disease)    Hypertension    does not routinely take her blood pressure medications every day.   Pneumonia    Primary open angle glaucoma 10/2015   Bilateral   Rapid heart rate    during pre-menopause - thinks it was due to a HTN medication   Restless leg syndrome 07/16/2020   Right lower lobe lung mass 09/04/2021     No family history on file.   Past Surgical History:  Procedure Laterality Date   APPENDECTOMY     BRONCHIAL BIOPSY  02/17/2022   Procedure: BRONCHIAL BIOPSIES;  Surgeon: Garner Nash, DO;  Location: [facility];  Service: Pulmonary;;   BRONCHIAL BRUSHINGS  02/17/2022   Procedure: BRONCHIAL BRUSHINGS;  Surgeon: Garner Nash, DO;  Location: [facility];  Service: Pulmonary;;   BRONCHIAL NEEDLE ASPIRATION BIOPSY  02/17/2022   Procedure: BRONCHIAL NEEDLE ASPIRATION BIOPSIES;  Surgeon: Garner Nash, DO;  Location: [facility];  Service: Pulmonary;;   COLONOSCOPY     DENTAL SURGERY     FINE NEEDLE ASPIRATION  02/17/2022   Procedure: FINE NEEDLE ASPIRATION (FNA) LINEAR;  Surgeon: Garner Nash, DO;  Location: [facility];  Service: Pulmonary;;   LAPAROSCOPIC SALPINGO OOPHERECTOMY     LINGUAL FRENECTOMY     at 9 years   [place] Bilateral 02/17/2022   Procedure: VIDEO BRONCHOSCOPY WITH ENDOBRONCHIAL ULTRASOUND;  Surgeon: Garner Nash, DO;  Location: [facility];  Service: Pulmonary;  Laterality: Bilateral;    Social History   Socioeconomic History   Marital status: Married    Spouse name: Not on file   Number of children: Not on file   Years of education: Not on file   Highest education level:  Not on file  Occupational History   Not on file  Tobacco Use   Smoking status: Every Day    Packs/day: 1.00    Years: 60.00    Total pack years: 60.00    Types: Cigarettes   Smokeless tobacco: Never   Tobacco comments:    Smoking 3 packs of cigarettes a week. 04/15/2022 [place]  Vaping Use   Vaping Use: Former   Devices: Did a trial study for vapes  Substance and Sexual Activity   Alcohol use: Yes    Comment: occasional   Drug use: Never   Sexual activity: Not Currently    Birth control/protection: Post-menopausal  Other Topics Concern   Not on file  Social History Narrative   Not on file   Social Determinants of Health   Financial Resource Strain: Not on file  Food Insecurity: Not on file  Transportation Needs: Not on file  Physical Activity: Not on file  Stress: Not on file  Social Connections: Not on file  Intimate Partner Violence: Not on file     Allergies  Allergen Reactions   Codeine Other (See Comments)    White spots on throat, Looks like strep throat (allergic reaction)     Outpatient Medications Prior to Visit  Medication Sig Dispense Refill   albuterol (VENTOLIN HFA) 108 (90 Base) MCG/ACT inhaler Inhale 2 puffs into the lungs every 6 (six) hours as needed for wheezing or shortness of breath. 8 g 6   aspirin EC 325 MG tablet Take 325 mg by mouth every 6 (six) hours as needed for moderate pain.     COLLAGEN PO Take 1 Scoop by mouth 3 (three) times a week.     Iodine TINC Apply 1 Application topically 2 (two) times a week.     latanoprost (XALATAN) 0.005 % ophthalmic solution Place 1 drop into both eyes at bedtime.     Multiple Vitamins-Minerals (EMERGEN-C IMMUNE PLUS) PACK Take 1 packet by mouth daily.     nicotine (NICODERM CQ - DOSED IN MG/24 HOURS) 14 mg/24hr patch Place 14 mg onto the skin daily as needed (nicotine dependence).     Omega-3 Fatty Acids (SALMON OIL PO) Take 1 capsule by mouth 3 (three) times a week.     phenol (CHLORASEPTIC) 1.4 % LIQD Use  as directed 2 sprays in the mouth or throat as needed for throat irritation / pain.     sacubitril-valsartan (ENTRESTO) 49-51 MG Take 0.5 tablets by mouth daily.     Tiotropium Bromide Monohydrate (SPIRIVA RESPIMAT) 2.5 MCG/ACT AERS Inhale 2 puffs into the lungs daily. (Patient not taking: Reported on 04/15/2022) 1 g 5   Vitamin D, Ergocalciferol, (DRISDOL) 1.25 MG (50000 UNIT) CAPS capsule Take 50,000 Units by mouth every Monday.     No facility-administered medications prior to visit.    Review of  Systems  Constitutional:  Positive for malaise/fatigue. Negative for chills, fever and weight loss.  HENT:  Negative for hearing loss, sore throat and tinnitus.   Eyes:  Negative for blurred vision and double vision.  Respiratory:  Positive for shortness of breath. Negative for cough, hemoptysis, sputum production, wheezing and stridor.   Cardiovascular:  Negative for chest pain, palpitations, orthopnea, leg swelling and PND.  Gastrointestinal:  Negative for abdominal pain, constipation, diarrhea, heartburn, nausea and vomiting.  Genitourinary:  Negative for dysuria, hematuria and urgency.  Musculoskeletal:  Negative for joint pain and myalgias.  Skin:  Negative for itching and rash.  Neurological:  Negative for dizziness, tingling, weakness and headaches.  Endo/Heme/Allergies:  Negative for environmental allergies. Does not bruise/bleed easily.  Psychiatric/Behavioral:  Negative for depression. The patient is not nervous/anxious and does not have insomnia.   All other systems reviewed and are negative.    Objective:  Physical Exam Vitals reviewed.  Constitutional:      General: She is not in acute distress.    Appearance: She is well-developed.  HENT:     Head: Normocephalic and atraumatic.     Mouth/Throat:     Pharynx: No oropharyngeal exudate.  Eyes:     Conjunctiva/sclera: Conjunctivae normal.     Pupils: Pupils are equal, round, and reactive to light.  Neck:     Vascular: No  JVD.     Trachea: No tracheal deviation.     Comments: Loss of supraclavicular fat Cardiovascular:     Rate and Rhythm: Normal rate and regular rhythm.     Heart sounds: S1 normal and S2 normal.     Comments: Distant heart tones Pulmonary:     Effort: No tachypnea or accessory muscle usage.     Breath sounds: No stridor. Decreased breath sounds (throughout all lung fields) present. No wheezing, rhonchi or rales.     Comments: Diminshed breath sounds bilaterally  Abdominal:     General: Bowel sounds are normal. There is no distension.     Palpations: Abdomen is soft.     Tenderness: There is no abdominal tenderness.  Musculoskeletal:        General: Deformity (muscle wasting ) present.  Skin:    General: Skin is warm and dry.     Capillary Refill: Capillary refill takes less than 2 seconds.     Findings: No rash.  Neurological:     Mental Status: She is alert and oriented to person, place, and time.  Psychiatric:        Behavior: Behavior normal.      Vitals:   04/15/22 1006  BP: (!) 140/80  Pulse: 80  SpO2: 98%  Weight: 121 lb 12.8 oz (55.2 kg)  Height: 5\' 5"  (1.651 m)   98% on RA BMI Readings from Last 3 Encounters:  04/15/22 20.27 kg/m  02/27/22 20.70 kg/m  02/23/22 20.49 kg/m   Wt Readings from Last 3 Encounters:  04/15/22 121 lb 12.8 oz (55.2 kg)  02/27/22 120 lb 9.6 oz (54.7 kg)  02/23/22 119 lb 6 oz (54.1 kg)     CBC    Component Value Date/Time   WBC 7.9 02/17/2022 0920   RBC 4.47 02/17/2022 0920   HGB 13.8 02/17/2022 0920   HCT 40.4 02/17/2022 0920   PLT 271 02/17/2022 0920   MCV 90.4 02/17/2022 0920   MCH 30.9 02/17/2022 0920   MCHC 34.2 02/17/2022 0920   RDW 13.4 02/17/2022 0920     Chest Imaging: LDCT June: 3mm  RLL lung nodule Concerning for malignancy The patient's images have been independently reviewed by me.    Nuclear medicine PET scan 6/37/8588: Hypermetabolic lesion 2.6 cm pulmonary nodule concerning for primary  bronchogenic carcinoma. The patient's images have been independently reviewed by me.    Pulmonary Functions Testing Results:    Latest Ref Rng & Units 04/15/2022    8:48 AM  PFT Results  FVC-Pre L 3.02  P  FVC-Predicted Pre % 99  P  FVC-Post L 3.29  P  FVC-Predicted Post % 108  P  Pre FEV1/FVC % % 28  P  Post FEV1/FCV % % 58  P  FEV1-Pre L 0.85  P  FEV1-Predicted Pre % 37  P  FEV1-Post L 1.90  P  DLCO uncorrected ml/min/mmHg 13.15  P  DLCO UNC% % 65  P  DLCO corrected ml/min/mmHg 13.00  P  DLCO COR %Predicted % 65  P  DLVA Predicted % 60  P  TLC L 6.44  P  TLC % Predicted % 123  P  RV % Predicted % 140  P    P Preliminary result    FeNO:   Pathology:   Echocardiogram:   Heart Catheterization:     Assessment & Plan:     ICD-10-CM   1. Primary cancer of right lower lobe of lung (HCC)  C34.31     2. Current smoker  F17.200     3. S/P radiation therapy  Z92.3     4. Stage 2 moderate COPD by GOLD classification ([place])  J44.9       Discussion:  This is a 72 year old female, longstanding history of tobacco abuse initially diagnosed with the primary bronchogenic carcinoma, non-small cell taken for SBRT treatments with radiation oncology.  Doing well at this time postradiation treatments.  Unfortunately she is still smoking.  Plan: Today in the office we had a long discussion regarding smoking cessation. Please see separate documentation regarding smoking cessation. Her pulmonary function test do suggest she has significant obstruction, air trapping present. We will get her started on a new maintenance inhaler regimen. She is hesitant about starting 1 but I do think that she would feel better and breathe better using Trelegy. New prescription today for Trelegy as well as samples if we have them. We also talked about smoking cessation and she is agreeable to at least try and start Wellbutrin. RTC in 6 months for COPD management smoking cessation  counseling.   Current Outpatient Medications:    albuterol (VENTOLIN HFA) 108 (90 Base) MCG/ACT inhaler, Inhale 2 puffs into the lungs every 6 (six) hours as needed for wheezing or shortness of breath., Disp: 8 g, Rfl: 6   buPROPion (WELLBUTRIN SR) 150 MG 12 hr tablet, Take 1 tablet (150 mg total) by mouth 2 (two) times daily. Start with one tablet once per day for 3 days and then go to twice per day, Disp: 60 tablet, Rfl: 2   Fluticasone-Umeclidin-Vilant (TRELEGY ELLIPTA) 100-62.5-25 MCG/ACT AEPB, Inhale 1 puff into the lungs daily., Disp: 3 each, Rfl: 1   aspirin EC 325 MG tablet, Take 325 mg by mouth every 6 (six) hours as needed for moderate pain., Disp: , Rfl:    COLLAGEN PO, Take 1 Scoop by mouth 3 (three) times a week., Disp: , Rfl:    Iodine TINC, Apply 1 Application topically 2 (two) times a week., Disp: , Rfl:    latanoprost (XALATAN) 0.005 % ophthalmic solution, Place 1 drop into both eyes at bedtime.,  Disp: , Rfl:    Multiple Vitamins-Minerals (EMERGEN-C IMMUNE PLUS) PACK, Take 1 packet by mouth daily., Disp: , Rfl:    nicotine (NICODERM CQ - DOSED IN MG/24 HOURS) 14 mg/24hr patch, Place 14 mg onto the skin daily as needed (nicotine dependence)., Disp: , Rfl:    Omega-3 Fatty Acids (SALMON OIL PO), Take 1 capsule by mouth 3 (three) times a week., Disp: , Rfl:    phenol (CHLORASEPTIC) 1.4 % LIQD, Use as directed 2 sprays in the mouth or throat as needed for throat irritation / pain., Disp: , Rfl:    sacubitril-valsartan (ENTRESTO) 49-51 MG, Take 0.5 tablets by mouth daily., Disp: , Rfl:    Tiotropium Bromide Monohydrate (SPIRIVA RESPIMAT) 2.5 MCG/ACT AERS, Inhale 2 puffs into the lungs daily. (Patient not taking: Reported on 04/15/2022), Disp: 1 g, Rfl: 5   Vitamin D, Ergocalciferol, (DRISDOL) 1.25 MG (50000 UNIT) CAPS capsule, Take 50,000 Units by mouth every Monday., Disp: , Rfl:     Garner Nash, DO [facility] 04/15/2022 10:45 AM

## 2022-04-15 NOTE — Patient Instructions (Signed)
Thank you for visiting Dr. Valeta Harms at [facility]. Today we recommend the following:  Stop smoking. Try the new medicine and start new inhaler.   Meds ordered this encounter  Medications   buPROPion (WELLBUTRIN SR) 150 MG 12 hr tablet    Sig: Take 1 tablet (150 mg total) by mouth 2 (two) times daily. Start with one tablet once per day for 3 days and then go to twice per day    Dispense:  60 tablet    Refill:  2   Fluticasone-Umeclidin-Vilant (TRELEGY ELLIPTA) 100-62.5-25 MCG/ACT AEPB    Sig: Inhale 1 puff into the lungs daily.    Dispense:  3 each    Refill:  1   Trelegy samples today   Return in about 6 months (around 10/14/2022) for with APP or Dr. Valeta Harms.    Please do your part to reduce the spread of COVID-19.   You must quit smoking or vaping. This is the single most important thing that you can do to improve your lung health.   S = Set a quit date. T = Tell family, friends, and the people around you that you plan to quit. A = Anticipate or plan ahead for the tough times you'll face while quitting. R = Remove cigarettes and other tobacco products from your home, car, and work T = Talk to [place] about getting help to quit  If you need help feel free to reach out to our office, [place] [facility]: [phone], call 1-800-QUIT-NOW, or visit [email or web address]

## 2022-05-01 NOTE — Progress Notes (Signed)
                                                                                                                                                             Patient Name: Carla Lane MRN: [id number] DOB: 21-Aug-1950 Referring Physician: June Leap Date of Service: 03/23/2022 [facility], [place]                                                        End Of Treatment Note  Diagnoses: C34.31-Malignant neoplasm of lower lobe, right bronchus or lung  Cancer Staging: 72 yo woman with a 3.8 cm non-small cell carcinoma of the right lower lobe of the lung - Stage IB (cT2a, cN0, cM0)    Intent: Curative  Radiation Treatment Dates: 03/11/2022 through 03/23/2022 Site Technique Total Dose (Gy) Dose per Fx (Gy) Completed Fx Beam Energies  Lung, Right: Lung_R IMRT 60/60 12 5/5 6XFFF   Narrative: The patient tolerated radiation therapy relatively well without any bothersome side effects.  Plan: The patient will receive a call in about one month from the radiation oncology department.  We will plan to obtain a posttreatment CT chest scan in April 2024, to assess her treatment response and I will call her with those results as soon as they are available.  She will continue follow up with her pulmonologist, Dr. Valeta Harms, as well, for management of her COPD.  ------------------------------------------------   Tyler Pita, MD [place]: [phone]  Fax: [phone] [email or web address]  Skype  [email or web address]

## 2022-05-05 ENCOUNTER — Ambulatory Visit
Admission: RE | Admit: 2022-05-05 | Discharge: 2022-05-05 | Disposition: A | Discharge status: Home / Self Care | Payer: Medicare Other | Source: Ambulatory Visit | Attending: Radiation Oncology | Admitting: Radiation Oncology

## 2022-05-05 NOTE — Progress Notes (Signed)
  Radiation Oncology         [phone] ________________________________  Name: Carla Lane MRN: [id number]  Date of Service: 05/05/2022  DOB: Jul 20, 1950  Post Treatment Telephone Note  Diagnosis:  72 yo woman with a 3.8 cm non-small cell carcinoma of the right lower lobe of the lung - Stage IB (cT2a, cN0, cM0)    Intent: Curative  Radiation Treatment Dates: 03/11/2022 through 03/23/2022 Site Technique Total Dose (Gy) Dose per Fx (Gy) Completed Fx Beam Energies  Lung, Right: Lung_R IMRT 60/60 12 5/5 6XFFF   (as documented in provider EOT note)   The patient was not available for call today. Detailed voicemail left.  The patient has scheduled follow up with her medical oncologist Dr. Valeta Harms for ongoing care, and was encouraged to call if she develops concerns or questions regarding radiation.    Leandra Kern, LPN

## 2022-05-15 ENCOUNTER — Other Ambulatory Visit: Payer: Self-pay

## 2022-05-15 ENCOUNTER — Inpatient Hospital Stay (HOSPITAL_COMMUNITY)
Admission: EM | Admit: 2022-05-15 | Discharge: 2022-05-17 | DRG: 389 | Disposition: A | Discharge status: Home / Self Care | Payer: Medicare Other | Attending: Internal Medicine | Admitting: Internal Medicine

## 2022-05-15 ENCOUNTER — Inpatient Hospital Stay (HOSPITAL_COMMUNITY): Payer: Medicare Other

## 2022-05-15 ENCOUNTER — Emergency Department (HOSPITAL_COMMUNITY): Payer: Medicare Other

## 2022-05-15 ENCOUNTER — Encounter (HOSPITAL_COMMUNITY): Payer: Self-pay | Admitting: Emergency Medicine

## 2022-05-15 DIAGNOSIS — F17210 Nicotine dependence, cigarettes, uncomplicated: Secondary | ICD-10-CM | POA: Diagnosis present

## 2022-05-15 DIAGNOSIS — Z716 Tobacco abuse counseling: Secondary | ICD-10-CM | POA: Diagnosis not present

## 2022-05-15 DIAGNOSIS — H401190 Primary open-angle glaucoma, unspecified eye, stage unspecified: Secondary | ICD-10-CM | POA: Diagnosis not present

## 2022-05-15 DIAGNOSIS — I10 Essential (primary) hypertension: Secondary | ICD-10-CM | POA: Diagnosis not present

## 2022-05-15 DIAGNOSIS — Z79899 Other long term (current) drug therapy: Secondary | ICD-10-CM

## 2022-05-15 DIAGNOSIS — K6389 Other specified diseases of intestine: Secondary | ICD-10-CM | POA: Diagnosis not present

## 2022-05-15 DIAGNOSIS — Z85118 Personal history of other malignant neoplasm of bronchus and lung: Secondary | ICD-10-CM

## 2022-05-15 DIAGNOSIS — Z7982 Long term (current) use of aspirin: Secondary | ICD-10-CM | POA: Diagnosis not present

## 2022-05-15 DIAGNOSIS — F17200 Nicotine dependence, unspecified, uncomplicated: Secondary | ICD-10-CM | POA: Insufficient documentation

## 2022-05-15 DIAGNOSIS — Z923 Personal history of irradiation: Secondary | ICD-10-CM | POA: Diagnosis not present

## 2022-05-15 DIAGNOSIS — J449 Chronic obstructive pulmonary disease, unspecified: Secondary | ICD-10-CM | POA: Diagnosis present

## 2022-05-15 DIAGNOSIS — Z86010 Personal history of colonic polyps: Secondary | ICD-10-CM

## 2022-05-15 DIAGNOSIS — K529 Noninfective gastroenteritis and colitis, unspecified: Secondary | ICD-10-CM | POA: Diagnosis present

## 2022-05-15 DIAGNOSIS — K219 Gastro-esophageal reflux disease without esophagitis: Secondary | ICD-10-CM | POA: Diagnosis present

## 2022-05-15 DIAGNOSIS — I5022 Chronic systolic (congestive) heart failure: Secondary | ICD-10-CM

## 2022-05-15 DIAGNOSIS — G2581 Restless legs syndrome: Secondary | ICD-10-CM | POA: Diagnosis present

## 2022-05-15 DIAGNOSIS — Z885 Allergy status to narcotic agent status: Secondary | ICD-10-CM | POA: Diagnosis not present

## 2022-05-15 DIAGNOSIS — R109 Unspecified abdominal pain: Secondary | ICD-10-CM | POA: Diagnosis not present

## 2022-05-15 DIAGNOSIS — I110 Hypertensive heart disease with heart failure: Secondary | ICD-10-CM | POA: Diagnosis present

## 2022-05-15 DIAGNOSIS — K56600 Partial intestinal obstruction, unspecified as to cause: Secondary | ICD-10-CM | POA: Diagnosis not present

## 2022-05-15 DIAGNOSIS — K56609 Unspecified intestinal obstruction, unspecified as to partial versus complete obstruction: Secondary | ICD-10-CM | POA: Diagnosis not present

## 2022-05-15 DIAGNOSIS — Z4682 Encounter for fitting and adjustment of non-vascular catheter: Secondary | ICD-10-CM | POA: Diagnosis not present

## 2022-05-15 LAB — URINALYSIS, ROUTINE W REFLEX MICROSCOPIC
Bilirubin Urine: NEGATIVE
Glucose, UA: NEGATIVE mg/dL
Hgb urine dipstick: NEGATIVE
Ketones, ur: NEGATIVE mg/dL
Leukocytes,Ua: NEGATIVE
Nitrite: NEGATIVE
Protein, ur: NEGATIVE mg/dL
Specific Gravity, Urine: 1.014 (ref 1.005–1.030)
pH: 7 (ref 5.0–8.0)

## 2022-05-15 LAB — CBC WITH DIFFERENTIAL/PLATELET
Abs Immature Granulocytes: 0.07 10*3/uL (ref 0.00–0.07)
Basophils Absolute: 0.1 10*3/uL (ref 0.0–0.1)
Basophils Relative: 1 %
Eosinophils Absolute: 0.1 10*3/uL (ref 0.0–0.5)
Eosinophils Relative: 1 %
HCT: 42.4 % (ref 36.0–46.0)
Hemoglobin: 14.3 g/dL (ref 12.0–15.0)
Immature Granulocytes: 1 %
Lymphocytes Relative: 15 %
Lymphs Abs: 1.5 10*3/uL (ref 0.7–4.0)
MCH: 30.7 pg (ref 26.0–34.0)
MCHC: 33.7 g/dL (ref 30.0–36.0)
MCV: 91 fL (ref 80.0–100.0)
Monocytes Absolute: 0.7 10*3/uL (ref 0.1–1.0)
Monocytes Relative: 7 %
Neutro Abs: 7.6 10*3/uL (ref 1.7–7.7)
Neutrophils Relative %: 75 %
Platelets: 269 10*3/uL (ref 150–400)
RBC: 4.66 MIL/uL (ref 3.87–5.11)
RDW: 13.3 % (ref 11.5–15.5)
WBC: 10 10*3/uL (ref 4.0–10.5)
nRBC: 0 % (ref 0.0–0.2)

## 2022-05-15 LAB — COMPREHENSIVE METABOLIC PANEL
ALT: 24 U/L (ref 0–44)
AST: 28 U/L (ref 15–41)
Albumin: 4.4 g/dL (ref 3.5–5.0)
Alkaline Phosphatase: 71 U/L (ref 38–126)
Anion gap: 11 (ref 5–15)
BUN: 15 mg/dL (ref 8–23)
CO2: 26 mmol/L (ref 22–32)
Calcium: 9.8 mg/dL (ref 8.9–10.3)
Chloride: 99 mmol/L (ref 98–111)
Creatinine, Ser: 0.71 mg/dL (ref 0.44–1.00)
GFR, Estimated: >60 mL/min (ref >60)
Glucose, Bld: 120 mg/dL — ABNORMAL HIGH (ref 70–99)
Potassium: 3.5 mmol/L (ref 3.5–5.1)
Sodium: 136 mmol/L (ref 135–145)
Total Bilirubin: 0.6 mg/dL (ref 0.3–1.2)
Total Protein: 7.4 g/dL (ref 6.5–8.1)

## 2022-05-15 LAB — LIPASE, BLOOD: Lipase: 53 U/L — ABNORMAL HIGH (ref 11–51)

## 2022-05-15 MED ORDER — UMECLIDINIUM BROMIDE 62.5 MCG/ACT IN AEPB
1.0000 | INHALATION_SPRAY | Freq: Every day | RESPIRATORY_TRACT | Status: DC
  Filled 2022-05-15: qty 7

## 2022-05-15 MED ORDER — ACETAMINOPHEN 650 MG RE SUPP: 650.0000 mg | Freq: Four times a day (QID) | RECTAL | Status: DC | PRN

## 2022-05-15 MED ORDER — METHOCARBAMOL 1000 MG/10ML IJ SOLN
500.0000 mg | Freq: Three times a day (TID) | INTRAVENOUS | Status: DC
  Administered 2022-05-15 – 2022-05-17 (×4): 500 mg via INTRAVENOUS
  Filled 2022-05-15 (×6): qty 5

## 2022-05-15 MED ORDER — ALBUTEROL SULFATE HFA 108 (90 BASE) MCG/ACT IN AERS: 2.0000 | INHALATION_SPRAY | Freq: Four times a day (QID) | RESPIRATORY_TRACT | Status: DC | PRN

## 2022-05-15 MED ORDER — LATANOPROST 0.005 % OP SOLN
1.0000 [drp] | Freq: Every day | OPHTHALMIC | Status: DC
  Administered 2022-05-15 – 2022-05-16 (×2): 1 [drp] via OPHTHALMIC
  Filled 2022-05-15 (×2): qty 2.5

## 2022-05-15 MED ORDER — FLUTICASONE FUROATE-VILANTEROL 100-25 MCG/ACT IN AEPB
1.0000 | INHALATION_SPRAY | Freq: Every day | RESPIRATORY_TRACT | Status: DC
  Filled 2022-05-15: qty 28

## 2022-05-15 MED ORDER — ONDANSETRON HCL 4 MG/2ML IJ SOLN
4.0000 mg | Freq: Once | INTRAMUSCULAR | Status: AC
  Administered 2022-05-15: 4 mg via INTRAVENOUS
  Filled 2022-05-15: qty 2

## 2022-05-15 MED ORDER — KETOROLAC TROMETHAMINE 15 MG/ML IJ SOLN
15.0000 mg | Freq: Four times a day (QID) | INTRAMUSCULAR | Status: DC
  Administered 2022-05-15 – 2022-05-17 (×8): 15 mg via INTRAVENOUS
  Filled 2022-05-15 (×8): qty 1

## 2022-05-15 MED ORDER — MORPHINE SULFATE (PF) 4 MG/ML IV SOLN
4.0000 mg | Freq: Once | INTRAVENOUS | Status: AC
  Administered 2022-05-15: 4 mg via INTRAVENOUS
  Filled 2022-05-15: qty 1

## 2022-05-15 MED ORDER — LIDOCAINE HCL URETHRAL/MUCOSAL 2 % EX GEL
1.0000 | Freq: Once | CUTANEOUS | Status: DC
  Filled 2022-05-15: qty 5

## 2022-05-15 MED ORDER — TIOTROPIUM BROMIDE MONOHYDRATE 2.5 MCG/ACT IN AERS: 2.0000 | INHALATION_SPRAY | Freq: Every day | RESPIRATORY_TRACT | Status: DC

## 2022-05-15 MED ORDER — LIDOCAINE VISCOUS HCL 2 % MT SOLN
15.0000 mL | Freq: Once | OROMUCOSAL | Status: AC
  Administered 2022-05-15: 15 mL via OROMUCOSAL
  Filled 2022-05-15: qty 15

## 2022-05-15 MED ORDER — HEPARIN SODIUM (PORCINE) 5000 UNIT/ML IJ SOLN
5000.0000 [IU] | Freq: Three times a day (TID) | INTRAMUSCULAR | Status: DC
  Administered 2022-05-15 – 2022-05-17 (×7): 5000 [IU] via SUBCUTANEOUS
  Filled 2022-05-15 (×6): qty 1

## 2022-05-15 MED ORDER — SODIUM CHLORIDE 0.9 % IV SOLN: INTRAVENOUS | Status: DC

## 2022-05-15 MED ORDER — BISACODYL 10 MG RE SUPP
10.0000 mg | Freq: Every day | RECTAL | Status: DC
  Administered 2022-05-15: 10 mg via RECTAL
  Filled 2022-05-15 (×3): qty 1

## 2022-05-15 MED ORDER — HYDRALAZINE HCL 20 MG/ML IJ SOLN: 5.0000 mg | INTRAMUSCULAR | Status: DC | PRN

## 2022-05-15 MED ORDER — ONDANSETRON HCL 4 MG/2ML IJ SOLN
4.0000 mg | Freq: Four times a day (QID) | INTRAMUSCULAR | Status: DC | PRN
  Administered 2022-05-15: 4 mg via INTRAVENOUS
  Filled 2022-05-15: qty 2

## 2022-05-15 MED ORDER — MORPHINE SULFATE (PF) 2 MG/ML IV SOLN
2.0000 mg | INTRAVENOUS | Status: DC | PRN
  Administered 2022-05-15 (×2): 2 mg via INTRAVENOUS
  Filled 2022-05-15 (×2): qty 1

## 2022-05-15 MED ORDER — NICOTINE 14 MG/24HR TD PT24
14.0000 mg | MEDICATED_PATCH | Freq: Every day | TRANSDERMAL | Status: DC
  Administered 2022-05-17: 14 mg via TRANSDERMAL
  Filled 2022-05-15 (×2): qty 1

## 2022-05-15 MED ORDER — ALBUTEROL SULFATE (2.5 MG/3ML) 0.083% IN NEBU: 2.5000 mg | INHALATION_SOLUTION | Freq: Four times a day (QID) | RESPIRATORY_TRACT | Status: DC | PRN

## 2022-05-15 MED ORDER — MIDAZOLAM HCL 2 MG/2ML IJ SOLN
2.0000 mg | Freq: Once | INTRAMUSCULAR | Status: AC
  Administered 2022-05-15: 2 mg via INTRAVENOUS
  Filled 2022-05-15: qty 2

## 2022-05-15 MED ORDER — ACETAMINOPHEN 500 MG PO TABS
1000.0000 mg | ORAL_TABLET | Freq: Four times a day (QID) | ORAL | Status: DC
  Administered 2022-05-15 – 2022-05-17 (×7): 1000 mg via ORAL
  Filled 2022-05-15 (×8): qty 2

## 2022-05-15 MED ORDER — LIDOCAINE HCL URETHRAL/MUCOSAL 2 % EX GEL: 1.0000 | Freq: Once | CUTANEOUS | Status: DC

## 2022-05-15 MED ORDER — ACETAMINOPHEN 325 MG PO TABS: 650.0000 mg | ORAL_TABLET | Freq: Four times a day (QID) | ORAL | Status: DC | PRN

## 2022-05-15 MED ORDER — IOHEXOL 300 MG/ML  SOLN
100.0000 mL | Freq: Once | INTRAMUSCULAR | Status: AC | PRN
  Administered 2022-05-15: 100 mL via INTRAVENOUS

## 2022-05-15 MED ORDER — OXYCODONE HCL 5 MG PO TABS: 5.0000 mg | ORAL_TABLET | ORAL | Status: DC | PRN

## 2022-05-15 MED ORDER — MORPHINE SULFATE (PF) 2 MG/ML IV SOLN: 2.0000 mg | INTRAVENOUS | Status: DC | PRN

## 2022-05-15 NOTE — Progress Notes (Signed)
Having no pain or nausea.   Ng tube to LIS and dark red brownish output of 150 since admit to floor.   Given tylenol crushed through ng and flushed and clamped for 30 minutes.

## 2022-05-15 NOTE — Progress Notes (Signed)
Pt arrived to room 320 via WC from ED. Pt ambulated to bed from chair without assistance. Pt denies any c/o abd pain or nausea at this time, states abd pain gone since NGT inserted and she has had small BM. Only c/o now is irritation in left nare and throat from NGT. Oriented to room and safety procedures, states understanding. Call in reach, advised to call for needs.

## 2022-05-15 NOTE — ED Provider Notes (Addendum)
Carla Provider Note   CSN: [id number] Arrival date & time: 05/15/22  [id number]     History  Chief Complaint  Patient presents with   Abdominal Lane    Carla Lane is a 72 y.o. female.  Patient presents to the emergency department for evaluation of diffuse abdominal Lane and back Lane.  Symptoms began 1 hour ago.  Patient reports Lane, Carla Lane, Carla Lane, Carla Lane.       Home Medications Prior to Admission medications   Medication Sig Start Date End Date Taking? Authorizing Provider  albuterol (VENTOLIN HFA) 108 (90 Base) MCG/ACT inhaler Inhale 2 puffs into the lungs every 6 (six) hours as needed for wheezing or shortness of breath. 12/25/21   Cobb, Karie Schwalbe, NP  aspirin EC 325 MG tablet Take 325 mg by mouth every 6 (six) hours as needed for moderate Lane.    [provider]  buPROPion (WELLBUTRIN SR) 150 MG 12 hr tablet Take 1 tablet (150 mg total) by mouth 2 (two) times daily. Start with one tablet once per day for 3 days and then go to twice per day 04/15/22 07/14/22  June Leap L, DO  COLLAGEN PO Take 1 Scoop by mouth 3 (three) times a week.    [provider]  Fluticasone-Umeclidin-Vilant (TRELEGY ELLIPTA) 100-62.5-25 MCG/ACT AEPB Inhale 1 puff into the lungs daily. 04/15/22 07/14/22  Garner Nash, DO  Iodine TINC Apply 1 Application topically 2 (two) times a week.    [provider]  latanoprost (XALATAN) 0.005 % ophthalmic solution Place 1 drop into both eyes at bedtime.    [provider]  Multiple Vitamins-Minerals (EMERGEN-C IMMUNE PLUS) PACK Take 1 packet by mouth daily.    [provider]  nicotine (NICODERM CQ - DOSED IN MG/24 HOURS) 14 mg/24hr patch Place 14 mg onto the skin daily as needed (nicotine dependence).    [provider]   Omega-3 Fatty Acids (SALMON OIL PO) Take 1 capsule by mouth 3 (three) times a week.    [provider]  phenol (CHLORASEPTIC) 1.4 % LIQD Use as directed 2 sprays in the mouth or throat as needed for throat irritation / Lane.    [provider]  sacubitril-valsartan (ENTRESTO) 49-51 MG Take 0.5 tablets by mouth daily. 11/10/21   [provider]  Tiotropium Bromide Monohydrate (SPIRIVA RESPIMAT) 2.5 MCG/ACT AERS Inhale 2 puffs into the lungs daily. Patient not taking: Reported on 04/15/2022 02/27/22   Parrett, Fonnie Mu, NP  Vitamin D, Ergocalciferol, (DRISDOL) 1.25 MG (50000 UNIT) CAPS capsule Take 50,000 Units by mouth every Monday. 08/18/21   [provider]      Allergies    Codeine    Review of Systems   Review of Systems  Physical Exam Updated Vital Signs BP (!) 178/80   Pulse 86   Temp 97.7 F (36.5 C) (Axillary)   Resp 18   Ht '5\' 5"'$  (1.651 m)   Wt 54.4 kg   SpO2 93%   BMI 19.97 kg/m  Physical Exam Vitals and nursing note reviewed.  Constitutional:      General: She is in acute distress.     Appearance: She is well-developed.  HENT:     Head: Normocephalic and atraumatic.     Mouth/Throat:     Mouth:  Mucous membranes are moist.  Eyes:     General: Vision grossly intact. Gaze aligned appropriately.     Extraocular Movements: Extraocular movements intact.     Conjunctiva/sclera: Conjunctivae normal.  Cardiovascular:     Rate and Rhythm: Normal rate and regular rhythm.     Pulses: Normal pulses.     Heart sounds: Normal heart sounds, S1 normal and S2 normal. Carla murmur heard.    Carla friction rub. Carla gallop.  Pulmonary:     Effort: Pulmonary effort is normal. Carla respiratory distress.     Breath sounds: Normal breath sounds.  Abdominal:     General: Bowel sounds are decreased.     Palpations: Abdomen is soft.     Tenderness: There is generalized abdominal tenderness. There is Carla guarding or rebound.     Hernia: Carla hernia is present.   Musculoskeletal:        General: Carla swelling.     Cervical back: Full passive range of motion without Lane, normal range of motion and neck supple. Carla spinous process tenderness or muscular tenderness. Normal range of motion.     Right lower leg: Carla edema.     Left lower leg: Carla edema.  Skin:    General: Skin is warm and dry.     Capillary Refill: Capillary refill takes less than 2 seconds.     Findings: Carla ecchymosis, erythema, rash or wound.  Neurological:     General: Carla focal deficit present.     Mental Status: She is alert and oriented to person, place, and time.     GCS: GCS eye subscore is 4. GCS verbal subscore is 5. GCS motor subscore is 6.     Cranial Nerves: Cranial nerves 2-12 are intact.     Sensory: Sensation is intact.     Motor: Motor function is intact.     Coordination: Coordination is intact.  Psychiatric:        Attention and Perception: Attention normal.        Mood and Affect: Mood normal.        Speech: Speech normal.        Behavior: Behavior normal.     ED Results / Procedures / Treatments   Labs (all labs ordered are listed, but only abnormal results are displayed) Labs Reviewed  COMPREHENSIVE METABOLIC PANEL - Abnormal; Notable for the following components:      Result Value   Glucose, Bld 120 (*)    All other components within normal limits  LIPASE, BLOOD - Abnormal; Notable for the following components:   Lipase 53 (*)    All other components within normal limits  URINALYSIS, ROUTINE W REFLEX MICROSCOPIC - Abnormal; Notable for the following components:   Color, Urine STRAW (*)    All other components within normal limits  CBC WITH DIFFERENTIAL/PLATELET    EKG None  Radiology CT ABDOMEN PELVIS W CONTRAST  Result Date: 05/15/2022 CLINICAL DATA:  Abdominal Lane. EXAM: CT ABDOMEN AND PELVIS WITH CONTRAST TECHNIQUE: Multidetector CT imaging of the abdomen and pelvis was performed using the standard protocol following bolus administration of  intravenous contrast. RADIATION DOSE REDUCTION: This exam was performed according to the departmental dose-optimization program which includes automated exposure control, adjustment of the mA and/or kV according to patient size and/or use of iterative reconstruction technique. CONTRAST:  112m OMNIPAQUE IOHEXOL 300 MG/ML  SOLN COMPARISON:  December 15, 2015 FINDINGS: Lower chest: A 3.5 cm x 1.7 cm thick walled cavitary lesion is seen  along the posteromedial aspect of the right lung base. Associated 8 mm and 9 mm peripheral soft tissue nodules are seen. A hypermetabolic lesion is seen within this area on prior nuclear medicine PET CT, consistent with a bronchogenic carcinoma. Hepatobiliary: There is diffuse fatty infiltration of the liver parenchyma. Carla focal liver abnormality is seen. Carla gallstones, gallbladder wall thickening, or biliary dilatation. Pancreas: Unremarkable. Carla pancreatic ductal dilatation or surrounding inflammatory changes. Spleen: Normal in size without focal abnormality. Adrenals/Urinary Tract: There is mild, stable diffuse right adrenal gland enlargement. A stable 2.5 cm x 2.0 cm low-attenuation (approximately 61.79 Hounsfield units) left adrenal mass is seen. Kidneys are normal in size, without renal calculi or hydronephrosis. Multiple bilateral subcentimeter simple renal cysts are seen. Bladder is unremarkable. Stomach/Bowel: Stomach is within normal limits. The appendix is not identified. Mildly dilated loops of ileum are seen within the right lower quadrant and right hemipelvis. An abrupt transition zone is seen within the pelvis on the right, just above the urinary bladder (axial CT images 43 through 53, CT series 2/coronal reformatted images 36 through 42, CT series 5). Noninflamed diverticula are seen throughout the large bowel. Vascular/Lymphatic: Aortic atherosclerosis. Carla enlarged abdominal or pelvic lymph nodes. Reproductive: Uterus and bilateral adnexa are unremarkable. Other: Carla  abdominal wall hernia or abnormality. Carla abdominopelvic ascites. Musculoskeletal: Moderate to marked severity degenerative changes are seen at the level of L2-L3. IMPRESSION: 1. High-grade distal partial small bowel obstruction with a transition zone seen within the pelvis on the right. 2. Thick walled, nodular appearing right lower lobe cavitary lesion, as described above, consistent with the patient's known primary bronchogenic carcinoma. 3. Stable left adrenal adenoma. Carla follow-up imaging is recommended. This recommendation follows ACR consensus guidelines: Management of Incidental Adrenal Masses: A White Paper of the ACR Incidental Findings Committee. J Am Coll Radiol 2017;14:1038-1044. 4. Colonic diverticulosis. 5. Multiple bilateral subcentimeter simple renal cysts. 6. Aortic atherosclerosis. Aortic Atherosclerosis (ICD10-I70.0). Electronically Signed   By: Virgina Norfolk M.D.   On: 05/15/2022 02:48    Procedures NG placement  Date/Time: 05/15/2022 4:19 AM  Performed by: Orpah Greek, MD Authorized by: Orpah Greek, MD  Consent: Verbal consent obtained. Risks and benefits: risks, benefits and alternatives were discussed Consent given by: patient Patient understanding: patient states understanding of the procedure being performed Site marked: the operative site was marked Imaging studies: imaging studies available Patient identity confirmed: verbally with patient Time out: Immediately prior to procedure a "time out" was called to verify the correct patient, procedure, equipment, support staff and site/side marked as required. Preparation: Patient was prepped and draped in the usual sterile fashion. Local anesthesia used: yes  Anesthesia: Local anesthesia used: yes Local Anesthetic: topical anesthetic and lidocaine spray  Sedation: Patient sedated: Carla  Patient tolerance: patient tolerated the procedure well with Carla immediate complications       Medications  Ordered in ED Medications  morphine (PF) 2 MG/ML injection 2 mg (has Carla administration in time range)  ondansetron (ZOFRAN) injection 4 mg (has Carla administration in time range)  lidocaine (XYLOCAINE) 2 % jelly 1 Application (1 Application Topical Not Given 05/15/22 0420)  ondansetron (ZOFRAN) injection 4 mg (4 mg Intravenous Given 05/15/22 0057)  morphine (PF) 4 MG/ML injection 4 mg (4 mg Intravenous Given 05/15/22 0057)  iohexol (OMNIPAQUE) 300 MG/ML solution 100 mL (100 mLs Intravenous Contrast Given 05/15/22 0218)  lidocaine (XYLOCAINE) 2 % viscous mouth solution 15 mL (15 mLs Mouth/Throat Given 05/15/22 0358)  midazolam (VERSED) injection 2  mg (2 mg Intravenous Given 05/15/22 0404)    ED Course/ Medical Decision Making/ A&P                             Medical Decision Making Amount and/or Complexity of Data Reviewed Labs: ordered. Radiology: ordered.  Risk Prescription drug management. Decision regarding hospitalization.   Differential diagnoses considered include but not limited to: Appendicitis; colitis; diverticulitis; bowel obstruction; cystitis; nephrolithiasis; pyelonephritis; renal colic  Presents with new onset of [name] that occurred 1 hour ago.  Patient with Lane and feeling like she needs to vomit.  She feels full, like she needs to pass gas but has not been able to for a number of hours.  Examination revealed diffuse tenderness, patient did appear uncomfortable at arrival.  She improved after morphine.  Lab work unremarkable.  A CT scan was performed and shows bowel obstruction.  NG tube to be placed, will admit.   Discussed with Dr. Derryl Harbor, general surgery - will consult.     Final Clinical Impression(s) / ED Diagnoses Final diagnoses:  SBO (small bowel obstruction) [facility])    Rx / DC Orders ED Discharge Orders     None         Robertha Staples, Gwenyth Allegra, MD 05/15/22 [id number]    Orpah Greek, MD 05/15/22 [phone]

## 2022-05-15 NOTE — Progress Notes (Signed)
Patient declined Incruse and Breo DPI's stating she "does not use them at home and heard they could cause thrush". Patient educated on  the [facility] and importance  she still declined at this time.

## 2022-05-15 NOTE — H&P (Signed)
History and Physical    Patient: Carla Lane [id number] DOB: February 14, 1951 DOA: 05/15/2022 DOS: the patient was seen and examined on 05/15/2022 PCP: Lucianne Lei, MD  Patient coming from: Home  Chief Complaint:  Chief Complaint  Patient presents with   Abdominal Pain   HPI: Carla Lane is a 72 y.o. female with medical history significant of lung cancer, depression, GERD, hypertension, and more presents the ED with a chief complaint of abdominal pain.  Patient reports at 67 PM on February 29 she had sudden onset of severe searing, knife sharp pain.  Patient reports is in the bilateral lower quadrants.  She reports it was constant but it did wax and wane.  It was progressively worse after it started.  She had no fever.  She tried Mobic at home with no relief.  She had some nausea and vomiting but denies any hematemesis.  Her last normal bowel movement was on the 29th.  Patient reports she has a bowel movement daily.  Her last normal meal was at noon on the 29th.  She denies any shortness of breath or chest pain.  Reports she might of had some palpitations.  She denies dysuria.  Patient has no other complaints at this time.  Patient smokes half a pack per day.  She does not drink alcohol.  She does not use illicit drugs.  She is vaccinated for COVID.  Patient is full code. Review of Systems: As mentioned in the history of present illness. All other systems reviewed and are negative. Past Medical History:  Diagnosis Date   Anemia    during menstruating years   Anxiety    Arthritis    Cancer ([place])    squamous (tip of nose) Basal - top of shoulder, top of each ears and top of forehead   Cataracts, bilateral 05/2017   Colon polyps    Depression    Dyspnea 11/2021   GERD (gastroesophageal reflux disease)    Hypertension    does not routinely take her blood pressure medications every day.   Pneumonia    Primary open angle glaucoma 10/2015   Bilateral   Rapid heart rate    during  pre-menopause - thinks it was due to a HTN medication   Restless leg syndrome 07/16/2020   Right lower lobe lung mass 09/04/2021   Past Surgical History:  Procedure Laterality Date   APPENDECTOMY     BRONCHIAL BIOPSY  02/17/2022   Procedure: BRONCHIAL BIOPSIES;  Surgeon: Garner Nash, DO;  Location: [facility];  Service: Pulmonary;;   BRONCHIAL BRUSHINGS  02/17/2022   Procedure: BRONCHIAL BRUSHINGS;  Surgeon: Garner Nash, DO;  Location: [facility];  Service: Pulmonary;;   BRONCHIAL NEEDLE ASPIRATION BIOPSY  02/17/2022   Procedure: BRONCHIAL NEEDLE ASPIRATION BIOPSIES;  Surgeon: Garner Nash, DO;  Location: [facility];  Service: Pulmonary;;   COLONOSCOPY     DENTAL SURGERY     FINE NEEDLE ASPIRATION  02/17/2022   Procedure: FINE NEEDLE ASPIRATION (FNA) LINEAR;  Surgeon: Garner Nash, DO;  Location: [facility];  Service: Pulmonary;;   LAPAROSCOPIC SALPINGO OOPHERECTOMY     LINGUAL FRENECTOMY     at 9 years   [place] Bilateral 02/17/2022   Procedure: VIDEO BRONCHOSCOPY WITH ENDOBRONCHIAL ULTRASOUND;  Surgeon: Garner Nash, DO;  Location: [facility];  Service: Pulmonary;  Laterality: Bilateral;   Social History:  reports that she has been smoking cigarettes. She has  a 60.00 pack-year smoking history. She has never used smokeless tobacco. She reports current alcohol use. She reports that she does not use drugs.  Allergies  Allergen Reactions   Codeine Other (See Comments)    White spots on throat, Looks like strep throat (allergic reaction)    History reviewed. No pertinent family history.  Prior to Admission medications   Medication Sig Start Date End Date Taking? Authorizing Provider  albuterol (VENTOLIN HFA) 108 (90 Base) MCG/ACT inhaler Inhale 2 puffs into the lungs every 6 (six) hours as needed for wheezing or shortness of breath. 12/25/21   Cobb, Karie Schwalbe, NP  aspirin EC 325 MG tablet Take  325 mg by mouth every 6 (six) hours as needed for moderate pain.    [provider]  buPROPion (WELLBUTRIN SR) 150 MG 12 hr tablet Take 1 tablet (150 mg total) by mouth 2 (two) times daily. Start with one tablet once per day for 3 days and then go to twice per day 04/15/22 07/14/22  June Leap L, DO  COLLAGEN PO Take 1 Scoop by mouth 3 (three) times a week.    [provider]  Fluticasone-Umeclidin-Vilant (TRELEGY ELLIPTA) 100-62.5-25 MCG/ACT AEPB Inhale 1 puff into the lungs daily. 04/15/22 07/14/22  Garner Nash, DO  Iodine TINC Apply 1 Application topically 2 (two) times a week.    [provider]  latanoprost (XALATAN) 0.005 % ophthalmic solution Place 1 drop into both eyes at bedtime.    [provider]  Multiple Vitamins-Minerals (EMERGEN-C IMMUNE PLUS) PACK Take 1 packet by mouth daily.    [provider]  nicotine (NICODERM CQ - DOSED IN MG/24 HOURS) 14 mg/24hr patch Place 14 mg onto the skin daily as needed (nicotine dependence).    [provider]  Omega-3 Fatty Acids (SALMON OIL PO) Take 1 capsule by mouth 3 (three) times a week.    [provider]  phenol (CHLORASEPTIC) 1.4 % LIQD Use as directed 2 sprays in the mouth or throat as needed for throat irritation / pain.    [provider]  sacubitril-valsartan (ENTRESTO) 49-51 MG Take 0.5 tablets by mouth daily. 11/10/21   [provider]  Tiotropium Bromide Monohydrate (SPIRIVA RESPIMAT) 2.5 MCG/ACT AERS Inhale 2 puffs into the lungs daily. Patient not taking: Reported on 04/15/2022 02/27/22   Parrett, Fonnie Mu, NP  Vitamin D, Ergocalciferol, (DRISDOL) 1.25 MG (50000 UNIT) CAPS capsule Take 50,000 Units by mouth every Monday. 08/18/21   [provider]    Physical Exam: Vitals:   05/15/22 0405 05/15/22 0430 05/15/22 0530 05/15/22 0535  BP: (!) 178/80 (!) 170/71 (!) 133/56 (!) 171/61  Pulse: 86 75 63 70  Resp: 18  18   Temp:      TempSrc:       SpO2: 93% 95% 95% 95%  Weight:      Height:       1.  General: Patient lying supine in bed,  no acute distress   2. Psychiatric: Alert and oriented x 3, mood and behavior normal for situation, pleasant and cooperative with exam   3. Neurologic: Speech and language are normal, face is symmetric, moves all 4 extremities voluntarily, at baseline without acute deficits on limited exam   4. HEENMT:  Head is atraumatic, normocephalic, pupils reactive to light, neck is supple, trachea is midline, mucous membranes are moist   5. Respiratory : Lungs are clear to auscultation bilaterally without wheezing, rhonchi, rales, no cyanosis, no increase in work  of breathing or accessory muscle use   6. Cardiovascular : Heart rate normal, rhythm is regular, no murmurs, rubs or gallops, no peripheral edema, peripheral pulses palpated   7. Gastrointestinal:  Abdomen is soft, nondistended, tender to palpation in both lower quadrants, no guarding, palpable firmness in right lower quadrant,    8. Skin:  Skin is warm, dry and intact without rashes, acute lesions, or ulcers on limited exam   9.Musculoskeletal:  No acute deformities or trauma, no asymmetry in tone, no peripheral edema, peripheral pulses palpated, no tenderness to palpation in the extremities  Data Reviewed: In the ED Temp 97.7, heart rate 70-84, respiratory rate 18-20, blood pressure 165/66-178/80 No leukocytosis with white blood cell count of 10, hemoglobin 14.3, platelets 269 Chemistries unremarkable UA is unremarkable CT abdomen pelvis shows a high-grade distal partial small bowel obstruction with transition zone within the right pelvis.  Shows a thick-walled nodular lesion consistent with patient's known bronchogenic carcinoma.  Chest x-ray shows enteric tube crossing over stomach Patient was given Versed for tube placement, morphine for pain control Zofran for nausea Admission requested for small bowel obstruction  Assessment  and Plan: * SBO (small bowel obstruction) (HCC) - CT abdomen pelvis shows high-grade distal partial small bowel obstruction with a transition zone seen within the right pelvis and other chronic findings - NG tube placed - ED provider to consult general surgery - Pain control with morphine - N.p.o. - IV fluids - Continue to monitor  Tobacco use disorder - Counseled on importance of cessation - Smokes half pack per day - Nicotine patch ordered  COPD (chronic obstructive pulmonary disease) (HCC) - Continue albuterol inhaler as needed, Breo and Incruse, and Spiriva - Counseled on importance of smoking cessation especially in the setting of lung cancer and COPD - Continue to monitor  Hypertension - Holding oral home medications due to NG tube on intermittent suction - As needed hydralazine - Blood pressure was quite high during my exam, but will likely improve with treatment of pain as well      Advance Care Planning:   Code Status: Full Code  Consults: General surgery  Family Communication: No family at bedside  Severity of Illness: The appropriate patient status for this patient is INPATIENT. Inpatient status is judged to be reasonable and necessary in order to provide the required intensity of service to ensure the patient's safety. The patient's presenting symptoms, physical exam findings, and initial radiographic and laboratory data in the context of their chronic comorbidities is felt to place them at high risk for further clinical deterioration. Furthermore, it is not anticipated that the patient will be medically stable for discharge from the hospital within 2 midnights of admission.   * I certify that at the point of admission it is my clinical judgment that the patient will require inpatient hospital care spanning beyond 2 midnights from the point of admission due to high intensity of service, high risk for further deterioration and high frequency of surveillance  required.*  Author: Rolla Plate, DO 05/15/2022 5:56 AM  For on call review [email or web address].

## 2022-05-15 NOTE — Consult Note (Signed)
[facility] Consult  Reason for Consult: abdominal pain  Referring Physician: Joseph Berkshire, MD.   Chief Complaint   Abdominal Pain     HPI: Carla Lane is a 72 y.o. female with abdominal pain that started yesterday afternoon after waking up from a nap and smoking a cigarette. The pain is described as a sudden stabbing pain in the RLQ. The patient says it feels like knives in her GI tract and the severity comes in waves with the pain being 10 out of 10 on the pain scale, 10 being the worst. It is associated with N/V, right sided back pain, and feeling full of gas. Patient had flatus yesterday afternoon after manually placing her finger in her rectum. She has felt this pain before 5 years ago and it resolved with conservative management after being admitted.  Patient tried Calcium, water, and Mobic but that has not helped. Last BM was yesterday afternoon and was smooth and big. Patient denies hematochezia or melena. She currently feels relief from the nausea and pain after NG tube placement and pain management.  Past Medical History:  Diagnosis Date   Anemia    during menstruating years   Anxiety    Arthritis    Cancer ([place])    squamous (tip of nose) Basal - top of shoulder, top of each ears and top of forehead   Cataracts, bilateral 05/2017   Colon polyps    Depression    Dyspnea 11/2021   GERD (gastroesophageal reflux disease)    Hypertension    does not routinely take her blood pressure medications every day.   Pneumonia    Primary open angle glaucoma 10/2015   Bilateral   Rapid heart rate    during pre-menopause - thinks it was due to a HTN medication   Restless leg syndrome 07/16/2020   Right lower lobe lung mass 09/04/2021    Past Surgical History:  Procedure Laterality Date   APPENDECTOMY     BRONCHIAL BIOPSY  02/17/2022   Procedure: BRONCHIAL BIOPSIES;  Surgeon: Garner Nash, DO;  Location: [facility];  Service: Pulmonary;;   BRONCHIAL  BRUSHINGS  02/17/2022   Procedure: BRONCHIAL BRUSHINGS;  Surgeon: Garner Nash, DO;  Location: [facility];  Service: Pulmonary;;   BRONCHIAL NEEDLE ASPIRATION BIOPSY  02/17/2022   Procedure: BRONCHIAL NEEDLE ASPIRATION BIOPSIES;  Surgeon: Garner Nash, DO;  Location: [facility];  Service: Pulmonary;;   COLONOSCOPY     DENTAL SURGERY     FINE NEEDLE ASPIRATION  02/17/2022   Procedure: FINE NEEDLE ASPIRATION (FNA) LINEAR;  Surgeon: Garner Nash, DO;  Location: [facility];  Service: Pulmonary;;   LAPAROSCOPIC SALPINGO OOPHERECTOMY     LINGUAL FRENECTOMY     at 9 years   [place] Bilateral 02/17/2022   Procedure: VIDEO BRONCHOSCOPY WITH ENDOBRONCHIAL ULTRASOUND;  Surgeon: Garner Nash, DO;  Location: [facility];  Service: Pulmonary;  Laterality: Bilateral;    History reviewed. No pertinent family history.  Social History   Tobacco Use   Smoking status: Every Day    Packs/day: 1.00    Years: 60.00    Total pack years: 60.00    Types: Cigarettes   Smokeless tobacco: Never   Tobacco comments:    Smoking 3 packs of cigarettes a week. 04/15/2022 [place]  Vaping Use   Vaping Use: Former   Devices: Did a trial study for vapes  Substance Use Topics   Alcohol  use: Yes    Comment: occasional   Drug use: Never    Medications: I have reviewed the patient's current medications.  Allergies  Allergen Reactions   Codeine Other (See Comments)    White spots on throat, Looks like strep throat (allergic reaction)     ROS:  Pertinent items are noted in HPI.  Blood pressure (!) 164/74, pulse 81, temperature 97.7 F (36.5 C), temperature source Axillary, resp. rate 18, height '5\' 5"'$  (1.651 m), weight 54.4 kg, SpO2 97 %. Physical Exam HENT:     Head: Normocephalic.  Pulmonary:     Effort: Pulmonary effort is normal.  Abdominal:     General: Abdomen is flat.     Palpations: Abdomen is soft.     Tenderness: There  is abdominal tenderness in the right lower quadrant and left lower quadrant.  Skin:    General: Skin is warm and dry.  Neurological:     Mental Status: She is alert.     Results: Results for orders placed or performed during the hospital encounter of 05/15/22 (from the past 48 hour(s))  CBC with Differential/Platelet     Status: None   Collection Time: 05/15/22 12:51 AM  Result Value Ref Range   WBC 10.0 4.0 - 10.5 K/uL   RBC 4.66 3.87 - 5.11 MIL/uL   Hemoglobin 14.3 12.0 - 15.0 g/dL   HCT 42.4 36.0 - 46.0 %   MCV 91.0 80.0 - 100.0 fL   MCH 30.7 26.0 - 34.0 pg   MCHC 33.7 30.0 - 36.0 g/dL   RDW 13.3 11.5 - 15.5 %   Platelets 269 150 - 400 K/uL   nRBC 0.0 0.0 - 0.2 %   Neutrophils Relative % 75 %   Neutro Abs 7.6 1.7 - 7.7 K/uL   Lymphocytes Relative 15 %   Lymphs Abs 1.5 0.7 - 4.0 K/uL   Monocytes Relative 7 %   Monocytes Absolute 0.7 0.1 - 1.0 K/uL   Eosinophils Relative 1 %   Eosinophils Absolute 0.1 0.0 - 0.5 K/uL   Basophils Relative 1 %   Basophils Absolute 0.1 0.0 - 0.1 K/uL   Immature Granulocytes 1 %   Abs Immature Granulocytes 0.07 0.00 - 0.07 K/uL    Comment: Performed at [facility], [address], [place], [place] [address]  Comprehensive metabolic panel     Status: Abnormal   Collection Time: 05/15/22 12:51 AM  Result Value Ref Range   Sodium 136 135 - 145 mmol/L   Potassium 3.5 3.5 - 5.1 mmol/L   Chloride 99 98 - 111 mmol/L   CO2 26 22 - 32 mmol/L   Glucose, Bld 120 (H) 70 - 99 mg/dL    Comment: Glucose reference range applies only to samples taken after fasting for at least 8 hours.   BUN 15 8 - 23 mg/dL   Creatinine, Ser 0.71 0.44 - 1.00 mg/dL   Calcium 9.8 8.9 - 10.3 mg/dL   Total Protein 7.4 6.5 - 8.1 g/dL   Albumin 4.4 3.5 - 5.0 g/dL   AST 28 15 - 41 U/L   ALT 24 0 - 44 U/L   Alkaline Phosphatase 71 38 - 126 U/L   Total Bilirubin 0.6 0.3 - 1.2 mg/dL   GFR, Estimated >60 >60 mL/min    Comment: (NOTE) Calculated using the CKD-EPI Creatinine  Equation (2021)    Anion gap 11 5 - 15    Comment: Performed at [facility], [address] [place], [place]  [address]  Lipase, blood     Status: Abnormal   Collection Time: 05/15/22 12:51 AM  Result Value Ref Range   Lipase 53 (H) 11 - 51 U/L    Comment: Performed at [facility], [address], [place], [place] [address]  Urinalysis, Routine w reflex microscopic -Urine, Clean Catch     Status: Abnormal   Collection Time: 05/15/22  2:44 AM  Result Value Ref Range   Color, Urine STRAW (A) YELLOW   APPearance CLEAR CLEAR   Specific Gravity, Urine 1.014 1.005 - 1.030   pH 7.0 5.0 - 8.0   Glucose, UA NEGATIVE NEGATIVE mg/dL   Hgb urine dipstick NEGATIVE NEGATIVE   Bilirubin Urine NEGATIVE NEGATIVE   Ketones, ur NEGATIVE NEGATIVE mg/dL   Protein, ur NEGATIVE NEGATIVE mg/dL   Nitrite NEGATIVE NEGATIVE   Leukocytes,Ua NEGATIVE NEGATIVE    Comment: Performed at [facility], [address], [place], [place] [address]    DG Chest Port 1 View  Result Date: 05/15/2022 CLINICAL DATA:  NG tube placement. EXAM: PORTABLE CHEST 1 VIEW COMPARISON:  02/17/2022. FINDINGS: The heart size and mediastinal contours are within normal limits. There is atherosclerotic calcification of the aorta. No consolidation, effusion, or pneumothorax. An enteric tube courses over the stomach and out of the field of view. Previously described paraspinal mass on the right is not visualized on this exam. No acute osseous abnormality. IMPRESSION: 1. No active disease. 2. Enteric tube courses over the stomach and out of the field of view. Electronically Signed   By: Brett Fairy M.D.   On: 05/15/2022 04:42   CT ABDOMEN PELVIS W CONTRAST  Result Date: 05/15/2022 CLINICAL DATA:  Abdominal pain. EXAM: CT ABDOMEN AND PELVIS WITH CONTRAST TECHNIQUE: Multidetector CT imaging of the abdomen and pelvis was performed using the standard protocol following bolus administration of intravenous contrast. RADIATION DOSE REDUCTION: This exam  was performed according to the departmental dose-optimization program which includes automated exposure control, adjustment of the mA and/or kV according to patient size and/or use of iterative reconstruction technique. CONTRAST:  119m OMNIPAQUE IOHEXOL 300 MG/ML  SOLN COMPARISON:  December 15, 2015 FINDINGS: Lower chest: A 3.5 cm x 1.7 cm thick walled cavitary lesion is seen along the posteromedial aspect of the right lung base. Associated 8 mm and 9 mm peripheral soft tissue nodules are seen. A hypermetabolic lesion is seen within this area on prior nuclear medicine PET CT, consistent with a bronchogenic carcinoma. Hepatobiliary: There is diffuse fatty infiltration of the liver parenchyma. No focal liver abnormality is seen. No gallstones, gallbladder wall thickening, or biliary dilatation. Pancreas: Unremarkable. No pancreatic ductal dilatation or surrounding inflammatory changes. Spleen: Normal in size without focal abnormality. Adrenals/Urinary Tract: There is mild, stable diffuse right adrenal gland enlargement. A stable 2.5 cm x 2.0 cm low-attenuation (approximately 61.79 Hounsfield units) left adrenal mass is seen. Kidneys are normal in size, without renal calculi or hydronephrosis. Multiple bilateral subcentimeter simple renal cysts are seen. Bladder is unremarkable. Stomach/Bowel: Stomach is within normal limits. The appendix is not identified. Mildly dilated loops of ileum are seen within the right lower quadrant and right hemipelvis. An abrupt transition zone is seen within the pelvis on the right, just above the urinary bladder (axial CT images 43 through 53, CT series 2/coronal reformatted images 36 through 42, CT series 5). Noninflamed diverticula are seen throughout the large bowel. Vascular/Lymphatic: Aortic atherosclerosis. No enlarged abdominal or pelvic lymph nodes. Reproductive: Uterus and bilateral adnexa are unremarkable. Other: No abdominal  wall hernia or abnormality. No abdominopelvic  ascites. Musculoskeletal: Moderate to marked severity degenerative changes are seen at the level of L2-L3. IMPRESSION: 1. High-grade distal partial small bowel obstruction with a transition zone seen within the pelvis on the right. 2. Thick walled, nodular appearing right lower lobe cavitary lesion, as described above, consistent with the patient's known primary bronchogenic carcinoma. 3. Stable left adrenal adenoma. No follow-up imaging is recommended. This recommendation follows ACR consensus guidelines: Management of Incidental Adrenal Masses: A White Paper of the ACR Incidental Findings Committee. J Am Coll Radiol 2017;14:1038-1044. 4. Colonic diverticulosis. 5. Multiple bilateral subcentimeter simple renal cysts. 6. Aortic atherosclerosis. Aortic Atherosclerosis (ICD10-I70.0). Electronically Signed   By: Virgina Norfolk M.D.   On: 05/15/2022 02:48     Assessment & Plan:  Carla Lane is a 72 y.o. female with abdominal pain from possible small bowel obstruction or enteritis  -NG tube placed and drained 350 cc so far -consider gastrografin challenge tomorrow -NPO -Suppository ordered -Continue IV fluids and pain management  All questions were answered to the satisfaction of the patient and family.  Glorious Peach 05/15/2022, 9:33 AM

## 2022-05-15 NOTE — Assessment & Plan Note (Signed)
-   Counseled on importance of cessation - Smokes half pack per day - Nicotine patch ordered

## 2022-05-15 NOTE — Assessment & Plan Note (Signed)
-   CT abdomen pelvis shows high-grade distal partial small bowel obstruction with a transition zone seen within the right pelvis and other chronic findings - NG tube placed - ED provider to consult general surgery - Pain control with morphine - N.p.o. - IV fluids - Continue to monitor

## 2022-05-15 NOTE — ED Triage Notes (Signed)
Pt arrives POV c/o generalized abd pain that radiates around to her back that started about 1 hr PTA. Pt took tums and mobic at home with no relief.

## 2022-05-15 NOTE — ED Notes (Addendum)
Attempted to put in NG tube but pt not allowing insertion at this time. Attempted twice and pt told this RN to stop and was pulling at tube. EDP made aware.

## 2022-05-15 NOTE — Assessment & Plan Note (Signed)
-   Stable and well-controlled -Resume home antihypertensive regimen -Low-sodium/heart healthy diet discussed with patient.

## 2022-05-15 NOTE — Progress Notes (Signed)
  Progress Note   Patient: Carla Lane [id number] DOB: [birth year] DOA: 05/15/2022     0 DOS: the patient was seen and examined on 05/15/2022   Brief hospital course: 72 year old woman PMH lung cancer presented with abdominal pain.  Admitted for high-grade distal partial small bowel obstruction.  Assessment and Plan: * SBO (small bowel obstruction) (HCC) CT abdomen pelvis showed high-grade distal partial small bowel obstruction with a transition zone seen within the right pelvis and other chronic findings Continue NG tube, management per general surgery Continue antiemetics, analgesia  COPD (chronic obstructive pulmonary disease) ([place]) Stable. Continue albuterol inhaler as needed, Breo and Incruse, and Spiriva  Essential hypertension Holding oral home medications due to NG tube on intermittent suction As needed hydralazine Treat pain  Tobacco use disorder Smokes half pack per day. Nicotine patch ordered      Subjective:  No nausea since medication given Some abdominal pain  Physical Exam: Vitals:   05/15/22 0535 05/15/22 0605 05/15/22 0625 05/15/22 0915  BP: (!) 171/61 (!) 185/67 (!) 161/62 (!) 164/74  Pulse: 70 65 65 81  Resp:  '18 18 18  '$ Temp:      TempSrc:      SpO2: 95% 96% 100% 97%  Weight:      Height:       Physical Exam Vitals reviewed.  Constitutional:      General: She is not in acute distress.    Appearance: She is not ill-appearing or toxic-appearing.  Cardiovascular:     Rate and Rhythm: Normal rate and regular rhythm.     Heart sounds: No murmur heard. Pulmonary:     Effort: Pulmonary effort is normal. No respiratory distress.     Breath sounds: No wheezing, rhonchi or rales.  Abdominal:     Palpations: Abdomen is soft.  Neurological:     Mental Status: She is alert.  Psychiatric:        Mood and Affect: Mood normal.        Behavior: Behavior normal.     Data Reviewed: CMP noted CBC WNL CT abd/pelvis, noted CXR NAD  Family  Communication: none present or requested  Disposition: Status is: Inpatient Remains inpatient appropriate because: partial SBO  Planned Discharge Destination: Home    Time spent: 20 minutes  Author: Murray Hodgkins, MD 05/15/2022 9:55 AM  For on call review [email or web address].

## 2022-05-15 NOTE — TOC Progression Note (Signed)
  Transition of Care [facility]) Screening Note   Patient Details  Name: CRISPINA SHEFFIELD Date of Birth: 09/28/1950   Transition of Care [facility] CM/SW Contact:    Shade Flood, LCSW Phone Number: 05/15/2022, 9:35 AM    Transition of Care Department [facility]) has reviewed patient and no TOC needs have been identified at this time. We will continue to monitor patient advancement through interdisciplinary progression rounds. If new patient transition needs arise, please place a TOC consult.

## 2022-05-15 NOTE — Hospital Course (Addendum)
72 year old woman PMH lung cancer presented with abdominal pain.  Admitted for high-grade distal partial small bowel obstruction.

## 2022-05-15 NOTE — Assessment & Plan Note (Signed)
-   Continue outpatient follow-up with pulmonology service -Smoking cessation provided -Nicotine patch recommended (patient was not ready to quit) -Resume home bronchodilator management.

## 2022-05-16 ENCOUNTER — Inpatient Hospital Stay (HOSPITAL_COMMUNITY): Payer: Medicare Other

## 2022-05-16 LAB — CBC WITH DIFFERENTIAL/PLATELET
Abs Immature Granulocytes: 0.03 10*3/uL (ref 0.00–0.07)
Basophils Absolute: 0 10*3/uL (ref 0.0–0.1)
Basophils Relative: 1 %
Eosinophils Absolute: 0 10*3/uL (ref 0.0–0.5)
Eosinophils Relative: 0 %
HCT: 38.3 % (ref 36.0–46.0)
Hemoglobin: 12.5 g/dL (ref 12.0–15.0)
Immature Granulocytes: 0 %
Lymphocytes Relative: 20 %
Lymphs Abs: 1.4 10*3/uL (ref 0.7–4.0)
MCH: 29.9 pg (ref 26.0–34.0)
MCHC: 32.6 g/dL (ref 30.0–36.0)
MCV: 91.6 fL (ref 80.0–100.0)
Monocytes Absolute: 0.7 10*3/uL (ref 0.1–1.0)
Monocytes Relative: 9 %
Neutro Abs: 5 10*3/uL (ref 1.7–7.7)
Neutrophils Relative %: 70 %
Platelets: 232 10*3/uL (ref 150–400)
RBC: 4.18 MIL/uL (ref 3.87–5.11)
RDW: 13.4 % (ref 11.5–15.5)
WBC: 7.1 10*3/uL (ref 4.0–10.5)
nRBC: 0 % (ref 0.0–0.2)

## 2022-05-16 LAB — COMPREHENSIVE METABOLIC PANEL
ALT: 20 U/L (ref 0–44)
AST: 32 U/L (ref 15–41)
Albumin: 3.4 g/dL — ABNORMAL LOW (ref 3.5–5.0)
Alkaline Phosphatase: 58 U/L (ref 38–126)
Anion gap: 8 (ref 5–15)
BUN: 19 mg/dL (ref 8–23)
CO2: 25 mmol/L (ref 22–32)
Calcium: 8.4 mg/dL — ABNORMAL LOW (ref 8.9–10.3)
Chloride: 107 mmol/L (ref 98–111)
Creatinine, Ser: 0.74 mg/dL (ref 0.44–1.00)
GFR, Estimated: >60 mL/min (ref >60)
Glucose, Bld: 99 mg/dL (ref 70–99)
Potassium: 3.2 mmol/L — ABNORMAL LOW (ref 3.5–5.1)
Sodium: 140 mmol/L (ref 135–145)
Total Bilirubin: 1.1 mg/dL (ref 0.3–1.2)
Total Protein: 5.7 g/dL — ABNORMAL LOW (ref 6.5–8.1)

## 2022-05-16 LAB — MAGNESIUM: Magnesium: 2.3 mg/dL (ref 1.7–2.4)

## 2022-05-16 MED ORDER — POTASSIUM CHLORIDE 10 MEQ/100ML IV SOLN
10.0000 meq | INTRAVENOUS | Status: AC
  Administered 2022-05-16 (×4): 10 meq via INTRAVENOUS
  Filled 2022-05-16: qty 100

## 2022-05-16 MED ORDER — METHOCARBAMOL 1000 MG/10ML IJ SOLN
INTRAMUSCULAR | Status: AC
  Filled 2022-05-16: qty 10

## 2022-05-16 MED ORDER — DIATRIZOATE MEGLUMINE & SODIUM 66-10 % PO SOLN
90.0000 mL | Freq: Once | ORAL | Status: AC
  Administered 2022-05-16: 90 mL via NASOGASTRIC
  Filled 2022-05-16: qty 90

## 2022-05-16 NOTE — Progress Notes (Signed)
  Progress Note   Patient: Carla Lane [id number] DOB: 15-May-1950 DOA: 05/15/2022     1 DOS: the patient was seen and examined on 05/16/2022   Brief hospital course: 72 year old woman PMH lung cancer presented with abdominal pain.  Admitted for high-grade distal partial small bowel obstruction.  Followed by surgery, some question whether this may be more gastroenteritis instead of bowel obstruction.  Continues management per surgery.  Clinically improving.  Assessment and Plan: *Partial SBO (small bowel obstruction) (HCC) versus gastroenteritis. CT abdomen pelvis showed high-grade distal partial small bowel obstruction with a transition zone seen within the right pelvis and other chronic findings Continue NG tube, management per general surgery.  Surgery questions whether this may be more gastroenteritis. Continue antiemetics, analgesia as needed.  Patient reports diarrhea.  No abdominal pain now or nausea. Continue management per general surgery.   COPD (chronic obstructive pulmonary disease) (HCC) Remains stable. Continue albuterol inhaler as needed, Breo and Incruse   Essential hypertension Stable. Holding oral home medications due to NG tube on intermittent suction As needed hydralazine Treat pain   Tobacco use disorder Smokes half pack per day. Nicotine patch ordered      Subjective:  Feels better No pain No n/v Hungry, wants to eat and go home  Physical Exam: Vitals:   05/15/22 1649 05/15/22 2033 05/16/22 0124 05/16/22 0440  BP: (!) 157/72 (!) 169/64 (!) 150/57 (!) 157/70  Pulse: 92 88 76 72  Resp: '20 20 12 14  '$ Temp: 98.4 F (36.9 C) 98.3 F (36.8 C) 98.3 F (36.8 C) 98 F (36.7 C)  TempSrc:   Oral   SpO2: 94% 95% 93% 97%  Weight:      Height:       Physical Exam Vitals reviewed.  Constitutional:      General: She is not in acute distress.    Appearance: She is not ill-appearing or toxic-appearing.     Comments: Has NGT  Cardiovascular:     Rate and  Rhythm: Normal rate and regular rhythm.     Heart sounds: No murmur heard. Pulmonary:     Effort: No respiratory distress.     Breath sounds: No wheezing, rhonchi or rales.  Abdominal:     General: There is no distension.     Palpations: Abdomen is soft.     Tenderness: There is no abdominal tenderness.  Neurological:     Mental Status: She is alert.  Psychiatric:        Mood and Affect: Mood normal.        Behavior: Behavior normal.     Data Reviewed: K+ 3.2 CBC WNL AXR pending, shows gas and stool on my read  Family Communication: none  Disposition: Status is: Inpatient Remains inpatient appropriate because: partial SBO vs gastreoentesisi  Planned Discharge Destination: Home    Time spent: 20 minutes  Author: Murray Hodgkins, MD 05/16/2022 10:19 AM  For on call review [email or web address].

## 2022-05-16 NOTE — Progress Notes (Signed)
[facility] Progress Note     Subjective: Patient seen and examined.  She is resting comfortably in bed.  She denies significant abdominal pain.  She also denies any nausea or vomiting.  She had a bowel movement this morning, but denies passing significant amounts of flatus since being in the hospital.  NG tube remains in place with 1200 cc of gastric contents in canister.  Objective: Vital signs in last 24 hours: Temp:  [98 F (36.7 C)-98.4 F (36.9 C)] 98 F (36.7 C) (03/02 0440) Pulse Rate:  [72-92] 72 (03/02 0440) Resp:  [12-20] 14 (03/02 0440) BP: (150-173)/(57-72) 157/70 (03/02 0440) SpO2:  [89 %-97 %] 97 % (03/02 0440) Last BM Date : 05/15/22  Intake/Output from previous day: 03/01 0701 - 03/02 0700 In: 1324.7 [I.V.:1274.7; IV Piggyback:50] Out: 1200 [Emesis/NG output:1200] Intake/Output this shift: No intake/output data recorded.  General appearance: alert, cooperative, and no distress Nose: NG tube in place with gastric contents in canister GI: Abdomen soft, nondistended, no percussion tenderness, nontender to palpation; no rigidity, guarding, rebound tenderness  Lab Results:  Recent Labs    05/15/22 0051 05/16/22 0454  WBC 10.0 7.1  HGB 14.3 12.5  HCT 42.4 38.3  PLT 269 232   BMET Recent Labs    05/15/22 0051 05/16/22 0454  NA 136 140  K 3.5 3.2*  CL 99 107  CO2 26 25  GLUCOSE 120* 99  BUN 15 19  CREATININE 0.71 0.74  CALCIUM 9.8 8.4*   PT/INR No results for input(s): "LABPROT", "INR" in the last 72 hours.  Studies/Results: DG Abd 2 Views  Result Date: 05/16/2022 CLINICAL DATA:  Abdominal pain. EXAM: ABDOMEN - 2 VIEW COMPARISON:  CT scan 05/15/2022 FINDINGS: NG tube tip is in the distal stomach. No gaseous small bowel dilatation to suggest obstruction. Air in stool is seen scattered along the length of a nondilated colon. Convex leftward lumbar scoliosis evident. IMPRESSION: NG tube tip is in the distal stomach. Diffuse gaseous  distention of small bowel and colon with a nonobstructive pattern. Electronically Signed   By: Misty Stanley M.D.   On: 05/16/2022 10:43   DG Chest Port 1 View  Result Date: 05/15/2022 CLINICAL DATA:  NG tube placement. EXAM: PORTABLE CHEST 1 VIEW COMPARISON:  02/17/2022. FINDINGS: The heart size and mediastinal contours are within normal limits. There is atherosclerotic calcification of the aorta. No consolidation, effusion, or pneumothorax. An enteric tube courses over the stomach and out of the field of view. Previously described paraspinal mass on the right is not visualized on this exam. No acute osseous abnormality. IMPRESSION: 1. No active disease. 2. Enteric tube courses over the stomach and out of the field of view. Electronically Signed   By: Brett Fairy M.D.   On: 05/15/2022 04:42   CT ABDOMEN PELVIS W CONTRAST  Result Date: 05/15/2022 CLINICAL DATA:  Abdominal pain. EXAM: CT ABDOMEN AND PELVIS WITH CONTRAST TECHNIQUE: Multidetector CT imaging of the abdomen and pelvis was performed using the standard protocol following bolus administration of intravenous contrast. RADIATION DOSE REDUCTION: This exam was performed according to the departmental dose-optimization program which includes automated exposure control, adjustment of the mA and/or kV according to patient size and/or use of iterative reconstruction technique. CONTRAST:  135m OMNIPAQUE IOHEXOL 300 MG/ML  SOLN COMPARISON:  December 15, 2015 FINDINGS: Lower chest: A 3.5 cm x 1.7 cm thick walled cavitary lesion is seen along the posteromedial aspect of the right lung base. Associated 8 mm and 9 mm  peripheral soft tissue nodules are seen. A hypermetabolic lesion is seen within this area on prior nuclear medicine PET CT, consistent with a bronchogenic carcinoma. Hepatobiliary: There is diffuse fatty infiltration of the liver parenchyma. No focal liver abnormality is seen. No gallstones, gallbladder wall thickening, or biliary dilatation. Pancreas:  Unremarkable. No pancreatic ductal dilatation or surrounding inflammatory changes. Spleen: Normal in size without focal abnormality. Adrenals/Urinary Tract: There is mild, stable diffuse right adrenal gland enlargement. A stable 2.5 cm x 2.0 cm low-attenuation (approximately 61.79 Hounsfield units) left adrenal mass is seen. Kidneys are normal in size, without renal calculi or hydronephrosis. Multiple bilateral subcentimeter simple renal cysts are seen. Bladder is unremarkable. Stomach/Bowel: Stomach is within normal limits. The appendix is not identified. Mildly dilated loops of ileum are seen within the right lower quadrant and right hemipelvis. An abrupt transition zone is seen within the pelvis on the right, just above the urinary bladder (axial CT images 43 through 53, CT series 2/coronal reformatted images 36 through 42, CT series 5). Noninflamed diverticula are seen throughout the large bowel. Vascular/Lymphatic: Aortic atherosclerosis. No enlarged abdominal or pelvic lymph nodes. Reproductive: Uterus and bilateral adnexa are unremarkable. Other: No abdominal wall hernia or abnormality. No abdominopelvic ascites. Musculoskeletal: Moderate to marked severity degenerative changes are seen at the level of L2-L3. IMPRESSION: 1. High-grade distal partial small bowel obstruction with a transition zone seen within the pelvis on the right. 2. Thick walled, nodular appearing right lower lobe cavitary lesion, as described above, consistent with the patient's known primary bronchogenic carcinoma. 3. Stable left adrenal adenoma. No follow-up imaging is recommended. This recommendation follows ACR consensus guidelines: Management of Incidental Adrenal Masses: A White Paper of the ACR Incidental Findings Committee. J Am Coll Radiol 2017;14:1038-1044. 4. Colonic diverticulosis. 5. Multiple bilateral subcentimeter simple renal cysts. 6. Aortic atherosclerosis. Aortic Atherosclerosis (ICD10-I70.0). Electronically Signed   By:  Virgina Norfolk M.D.   On: 05/15/2022 02:48    Anti-infectives: Anti-infectives (From admission, onward)    None       Assessment/Plan:  Patient is a 72 year old female who was admitted with concern for small bowel obstruction.  On my review of the imaging, I question whether this is obstruction versus enteritis.  -KUB this morning with gaseous distention of small bowel and colon, no evidence of obstructive pattern -Given that the patient has not had significant flatus, will obtain a small bowel obstruction protocol today to rule out obstructive pathology -NG to LIS unless clamped for small bowel obstruction protocol -NPO -IV fluids -Continue Dulcolax suppositories -Monitor for return of bowel function -PRN pain control and antiemetics -No acute surgical intervention at this time -Further recommendations to follow small bowel obstruction protocol   LOS: 1 day    Tyerra Loretto A Nayla Dias 05/16/2022

## 2022-05-17 DIAGNOSIS — Z85118 Personal history of other malignant neoplasm of bronchus and lung: Secondary | ICD-10-CM

## 2022-05-17 DIAGNOSIS — I5022 Chronic systolic (congestive) heart failure: Secondary | ICD-10-CM

## 2022-05-17 LAB — BASIC METABOLIC PANEL
Anion gap: 9 (ref 5–15)
BUN: 10 mg/dL (ref 8–23)
CO2: 18 mmol/L — ABNORMAL LOW (ref 22–32)
Calcium: 8.1 mg/dL — ABNORMAL LOW (ref 8.9–10.3)
Chloride: 110 mmol/L (ref 98–111)
Creatinine, Ser: 0.56 mg/dL (ref 0.44–1.00)
GFR, Estimated: >60 mL/min (ref >60)
Glucose, Bld: 81 mg/dL (ref 70–99)
Potassium: 3.4 mmol/L — ABNORMAL LOW (ref 3.5–5.1)
Sodium: 137 mmol/L (ref 135–145)

## 2022-05-17 LAB — PHOSPHORUS: Phosphorus: 2.6 mg/dL (ref 2.5–4.6)

## 2022-05-17 LAB — MAGNESIUM: Magnesium: 1.9 mg/dL (ref 1.7–2.4)

## 2022-05-17 MED ORDER — DOCUSATE SODIUM 100 MG PO CAPS: 100.0000 mg | ORAL_CAPSULE | Freq: Two times a day (BID) | ORAL | 1 refills | Status: AC

## 2022-05-17 MED ORDER — METHOCARBAMOL 1000 MG/10ML IJ SOLN
INTRAMUSCULAR | Status: AC
  Filled 2022-05-17: qty 10

## 2022-05-17 MED ORDER — ONDANSETRON 8 MG PO TBDP: 8.0000 mg | ORAL_TABLET | Freq: Three times a day (TID) | ORAL | 0 refills | Status: DC | PRN

## 2022-05-17 MED ORDER — POLYETHYLENE GLYCOL 3350 17 G PO PACK: 17.0000 g | PACK | Freq: Every day | ORAL | 0 refills | Status: DC | PRN

## 2022-05-17 NOTE — Progress Notes (Signed)
Inhalers breo and incruse have been dc as patient refuses to use them.

## 2022-05-17 NOTE — Progress Notes (Addendum)
Pt NG tube removed per MD order. Pt completed small bowel protocol. Tolerated well.

## 2022-05-17 NOTE — Discharge Summary (Signed)
Physician Discharge Summary   Patient: Carla Lane MRN: [id number] DOB: 25-Jul-1950  Admit date:     05/15/2022  Discharge date: 05/17/22  Discharge Physician: Barton Dubois   PCP: Lucianne Lei, MD   Recommendations at discharge:  Repeat basic metabolic panel to follow ultralights renal function Reassess blood pressure and adjust antihypertensive regimen as needed Make sure patient continue to follow-up as an outpatient with pulmonologist and oncology.  Discharge Diagnoses: Principal Problem:   SBO (small bowel obstruction) (HCC) Active Problems:   Hypertension   COPD (chronic obstructive pulmonary disease) (HCC)   Tobacco use disorder   Chronic systolic heart failure (HCC) Hx of lung cancer.  Hospital Course: 72 year old woman PMH lung cancer presented with abdominal pain.  Admitted for high-grade distal partial small bowel obstruction.  Followed by surgery, some question whether this may be more gastroenteritis instead of bowel obstruction.  Continues management per surgery.  Clinically improving.  Assessment and Plan: * SBO (small bowel obstruction) (HCC) - CT abdomen pelvis shows high-grade distal partial small bowel obstruction with a transition zone seen within the right pelvis and other chronic findings - Conservative management with NG tube placement, fluid resuscitation, electrolyte repletion and as needed antiemetics and analgesics were provided. -Patient responded adequately and at time of discharge was able to tolerate diet, passing gas, no significant abdominal discomfort and having bowel movements.  No nausea or vomiting -Will continue patient follow-up as needed with general surgery and with PCP.   Hx of cancer of lung -Status post surgery and radiation -Appears to be in remission -Continue outpatient follow-up with pulmonology and oncology service.  Chronic systolic heart failure ([place]) -Per history/chart review -Compensated and overall stable -Resewn the use  of Entresto -Adequate hydration, daily weights and low-sodium diet discussed with patient.  Tobacco use disorder - Counseled on importance of cessation - Nicotine patch usage encouraged -Patient not ready to quit.  COPD (chronic obstructive pulmonary disease) ([place]) - Continue outpatient follow-up with pulmonology service -Smoking cessation provided -Nicotine patch recommended (patient was not ready to quit) -Resume home bronchodilator management.  Hypertension - Stable and well-controlled -Resume home antihypertensive regimen -Low-sodium/heart healthy diet discussed with patient.  Consultants: General surgery Procedures performed: See below for x-ray reports. Disposition: Home Diet recommendation: Heart healthy/low-sodium diet; soft diet consistency.  DISCHARGE MEDICATION: Allergies as of 05/17/2022       Reactions   Codeine Other (See Comments)   White spots on throat, Looks like strep throat (allergic reaction)        Medication List     STOP taking these medications    buPROPion 150 MG 12 hr tablet Commonly known as: Wellbutrin SR   Spiriva Respimat 2.5 MCG/ACT Aers Generic drug: Tiotropium Bromide Monohydrate       TAKE these medications    albuterol 108 (90 Base) MCG/ACT inhaler Commonly known as: VENTOLIN HFA Inhale 2 puffs into the lungs every 6 (six) hours as needed for wheezing or shortness of breath.   aspirin EC 325 MG tablet Take 325 mg by mouth every 6 (six) hours as needed for moderate pain.   docusate sodium 100 MG capsule Commonly known as: Colace Take 1 capsule (100 mg total) by mouth 2 (two) times daily.   Emergen-C Immune Plus Pack Take 1 packet by mouth daily.   Iodine Tinc Apply 1 Application topically 2 (two) times a week.   latanoprost 0.005 % ophthalmic solution Commonly known as: XALATAN Place 1 drop into both eyes at bedtime.   nicotine  14 mg/24hr patch Commonly known as: NICODERM CQ - dosed in mg/24 hours Place 14 mg  onto the skin daily as needed (nicotine dependence).   ondansetron 8 MG disintegrating tablet Commonly known as: ZOFRAN-ODT Take 1 tablet (8 mg total) by mouth every 8 (eight) hours as needed for nausea or vomiting.   phenol 1.4 % Liqd Commonly known as: CHLORASEPTIC Use as directed 2 sprays in the mouth or throat as needed for throat irritation / pain.   polyethylene glycol 17 g packet Commonly known as: MiraLax Take 17 g by mouth daily as needed.   sacubitril-valsartan 49-51 MG Commonly known as: ENTRESTO Take 0.5 tablets by mouth daily.   SALMON OIL PO Take 1 capsule by mouth 3 (three) times a week.   Trelegy Ellipta 100-62.5-25 MCG/ACT Aepb Generic drug: Fluticasone-Umeclidin-Vilant Inhale 1 puff into the lungs daily.   Vitamin D (Ergocalciferol) 1.25 MG (50000 UNIT) Caps capsule Commonly known as: DRISDOL Take 50,000 Units by mouth every Monday.        Follow-up Information     Pappayliou, Flint Melter, DO. Call.   Specialty: General Surgery Why: As needed Contact information: [address] [place] [facility] [address] [phone]         Lucianne Lei, MD. Schedule an appointment as soon as possible for a visit in 10 day(s).   Specialty: Family Medicine Contact information: [address] [place] [address] [phone]                Discharge Exam: Filed Weights   05/15/22 0044  Weight: 54.4 kg   General exam: Alert, awake, oriented x 3 Respiratory system: Clear to auscultation. Respiratory effort normal. Cardiovascular system:RRR. No murmurs, rubs, gallops. Gastrointestinal system: Abdomen is nondistended, soft and nontender. No organomegaly or masses felt. Normal bowel sounds heard. Central nervous system: Alert and oriented. No focal neurological deficits. Extremities: No C/C/E, +pedal pulses Skin: No rashes, lesions or ulcers Psychiatry: Judgement and insight appear normal. Mood & affect appropriate.    Condition at  discharge: good and stable  The results of significant diagnostics from this hospitalization (including imaging, microbiology, ancillary and laboratory) are listed below for reference.   Imaging Studies: DG Abd Portable 1V-Small Bowel Obstruction Protocol-initial, 8 hr delay  Result Date: 05/16/2022 CLINICAL DATA:  8 hour follow-up EXAM: PORTABLE ABDOMEN - 1 VIEW COMPARISON:  05/16/2022 FINDINGS: NG tube is in the stomach. Oral contrast material is noted throughout the colon. No dilated small bowel loops. No organomegaly or free air. Visualized lungs clear. IMPRESSION: Oral contrast material within the colon. No evidence of small-bowel obstruction. Electronically Signed   By: Rolm Baptise M.D.   On: 05/16/2022 21:03   DG Abd Portable 1V  Result Date: 05/16/2022 CLINICAL DATA:  Small bowel obstruction suspected EXAM: PORTABLE ABDOMEN - 1 VIEW COMPARISON:  05/16/2022, 8:28 a.m. FINDINGS: Enteric contrast opacification of the stomach and duodenum. Gas-filled, although not significantly distended small bowel and colon throughout the abdomen and pelvis with gas present to the rectum. No free air. IMPRESSION: Enteric contrast opacification of the stomach and duodenum. Gas-filled, although not significantly distended small bowel and colon throughout the abdomen and pelvis with gas present to the rectum. Electronically Signed   By: Delanna Ahmadi M.D.   On: 05/16/2022 12:50   DG Abd 2 Views  Result Date: 05/16/2022 CLINICAL DATA:  Abdominal pain. EXAM: ABDOMEN - 2 VIEW COMPARISON:  CT scan 05/15/2022 FINDINGS: NG tube tip is in the distal stomach. No gaseous small  bowel dilatation to suggest obstruction. Air in stool is seen scattered along the length of a nondilated colon. Convex leftward lumbar scoliosis evident. IMPRESSION: NG tube tip is in the distal stomach. Diffuse gaseous distention of small bowel and colon with a nonobstructive pattern. Electronically Signed   By: Misty Stanley M.D.   On: 05/16/2022  10:43   DG Chest Port 1 View  Result Date: 05/15/2022 CLINICAL DATA:  NG tube placement. EXAM: PORTABLE CHEST 1 VIEW COMPARISON:  02/17/2022. FINDINGS: The heart size and mediastinal contours are within normal limits. There is atherosclerotic calcification of the aorta. No consolidation, effusion, or pneumothorax. An enteric tube courses over the stomach and out of the field of view. Previously described paraspinal mass on the right is not visualized on this exam. No acute osseous abnormality. IMPRESSION: 1. No active disease. 2. Enteric tube courses over the stomach and out of the field of view. Electronically Signed   By: Brett Fairy M.D.   On: 05/15/2022 04:42   CT ABDOMEN PELVIS W CONTRAST  Result Date: 05/15/2022 CLINICAL DATA:  Abdominal pain. EXAM: CT ABDOMEN AND PELVIS WITH CONTRAST TECHNIQUE: Multidetector CT imaging of the abdomen and pelvis was performed using the standard protocol following bolus administration of intravenous contrast. RADIATION DOSE REDUCTION: This exam was performed according to the departmental dose-optimization program which includes automated exposure control, adjustment of the mA and/or kV according to patient size and/or use of iterative reconstruction technique. CONTRAST:  126m OMNIPAQUE IOHEXOL 300 MG/ML  SOLN COMPARISON:  December 15, 2015 FINDINGS: Lower chest: A 3.5 cm x 1.7 cm thick walled cavitary lesion is seen along the posteromedial aspect of the right lung base. Associated 8 mm and 9 mm peripheral soft tissue nodules are seen. A hypermetabolic lesion is seen within this area on prior nuclear medicine PET CT, consistent with a bronchogenic carcinoma. Hepatobiliary: There is diffuse fatty infiltration of the liver parenchyma. No focal liver abnormality is seen. No gallstones, gallbladder wall thickening, or biliary dilatation. Pancreas: Unremarkable. No pancreatic ductal dilatation or surrounding inflammatory changes. Spleen: Normal in size without focal  abnormality. Adrenals/Urinary Tract: There is mild, stable diffuse right adrenal gland enlargement. A stable 2.5 cm x 2.0 cm low-attenuation (approximately 61.79 Hounsfield units) left adrenal mass is seen. Kidneys are normal in size, without renal calculi or hydronephrosis. Multiple bilateral subcentimeter simple renal cysts are seen. Bladder is unremarkable. Stomach/Bowel: Stomach is within normal limits. The appendix is not identified. Mildly dilated loops of ileum are seen within the right lower quadrant and right hemipelvis. An abrupt transition zone is seen within the pelvis on the right, just above the urinary bladder (axial CT images 43 through 53, CT series 2/coronal reformatted images 36 through 42, CT series 5). Noninflamed diverticula are seen throughout the large bowel. Vascular/Lymphatic: Aortic atherosclerosis. No enlarged abdominal or pelvic lymph nodes. Reproductive: Uterus and bilateral adnexa are unremarkable. Other: No abdominal wall hernia or abnormality. No abdominopelvic ascites. Musculoskeletal: Moderate to marked severity degenerative changes are seen at the level of L2-L3. IMPRESSION: 1. High-grade distal partial small bowel obstruction with a transition zone seen within the pelvis on the right. 2. Thick walled, nodular appearing right lower lobe cavitary lesion, as described above, consistent with the patient's known primary bronchogenic carcinoma. 3. Stable left adrenal adenoma. No follow-up imaging is recommended. This recommendation follows ACR consensus guidelines: Management of Incidental Adrenal Masses: A White Paper of the ACR Incidental Findings Committee. J Am Coll Radiol 2017;14:1038-1044. 4. Colonic diverticulosis. 5. Multiple bilateral subcentimeter  simple renal cysts. 6. Aortic atherosclerosis. Aortic Atherosclerosis (ICD10-I70.0). Electronically Signed   By: Virgina Norfolk M.D.   On: 05/15/2022 02:48    Microbiology: Results for orders placed or performed in visit on  02/13/22  Novel Coronavirus, NAA ([facility])     Status: None   Collection Time: 02/13/22  2:16 PM   Specimen: Nasopharyngeal(NP) swabs in vial transport medium   Nasopharynge  Previous  Result Value Ref Range Status   SARS-CoV-2, NAA Not Detected Not Detected Final    Comment: This nucleic acid amplification test was developed and its performance characteristics determined by [employer]. Nucleic acid amplification tests include RT-PCR and TMA. This test has not been FDA cleared or approved. This test has been authorized by FDA under an Emergency Use Authorization (EUA). This test is only authorized for the duration of time the declaration that circumstances exist justifying the authorization of the emergency use of in vitro diagnostic tests for detection of SARS-CoV-2 virus and/or diagnosis of COVID-19 infection under section 564(b)(1) of the Act, 21 U.S.C. PT:[id number]) (1), unless the authorization is terminated or revoked sooner. When diagnostic testing is negative, the possibility of a false negative result should be considered in the context of a patient's recent exposures and the presence of clinical signs and symptoms consistent with COVID-19. An individual without symptoms of COVID-19 and who is not shedding SARS-CoV-2 virus wo uld expect to have a negative (not detected) result in this assay.     Labs: CBC: Recent Labs  Lab 05/15/22 0051 05/16/22 0454  WBC 10.0 7.1  NEUTROABS 7.6 5.0  HGB 14.3 12.5  HCT 42.4 38.3  MCV 91.0 91.6  PLT 269 [id number]   Basic Metabolic Panel: Recent Labs  Lab 05/15/22 0051 05/16/22 0454 05/17/22 0500  NA 136 140 137  K 3.5 3.2* 3.4*  CL 99 107 110  CO2 26 25 18*  GLUCOSE 120* 99 81  BUN '15 19 10  '$ CREATININE 0.71 0.74 0.56  CALCIUM 9.8 8.4* 8.1*  MG  --  2.3 1.9  PHOS  --   --  2.6   Liver Function Tests: Recent Labs  Lab 05/15/22 0051 05/16/22 0454  AST 28 32  ALT 24 20  ALKPHOS 71 58  BILITOT 0.6 1.1  PROT 7.4  5.7*  ALBUMIN 4.4 3.4*   CBG: No results for input(s): "GLUCAP" in the last 168 hours.  Discharge time spent: greater than 30 minutes.  Signed: Barton Dubois, MD [facility] 05/17/2022

## 2022-05-17 NOTE — Plan of Care (Signed)

## 2022-05-17 NOTE — Assessment & Plan Note (Signed)
-  Per history/chart review -Compensated and overall stable -Resewn the use of Entresto -Adequate hydration, daily weights and low-sodium diet discussed with patient.

## 2022-05-17 NOTE — Assessment & Plan Note (Signed)
-  Status post surgery and radiation -Appears to be in remission -Continue outpatient follow-up with pulmonology and oncology service.

## 2022-05-17 NOTE — Progress Notes (Signed)
Went to do hourly rounding and patient has removed IV and eloped.

## 2022-05-17 NOTE — Progress Notes (Signed)
[facility] Progress Note     Subjective: Patient seen and examined.  She is resting comfortably in bed.  She was able to tolerate her clears without nausea and vomiting, and is ready for more solid food.  She denies any further abdominal pain.  She also had multiple bowel movements after small bowel obstruction protocol study yesterday.  She confirms passing flatus.  Objective: Vital signs in last 24 hours: Temp:  [98 F (36.7 C)-98.7 F (37.1 C)] 98 F (36.7 C) (03/03 0346) Pulse Rate:  [68-72] 68 (03/03 0346) Resp:  [14] 14 (03/03 0346) BP: (177-181)/(65-77) 177/65 (03/03 0346) SpO2:  [95 %-96 %] 96 % (03/03 0346) Last BM Date : 05/17/22  Intake/Output from previous day: 03/02 0701 - 03/03 0700 In: 4794.7 [P.O.:240; I.V.:3939.6; NG/GT:5; IV Piggyback:610.2] Out: 330 [Emesis/NG output:330] Intake/Output this shift: No intake/output data recorded.  General appearance: alert, cooperative, and no distress GI: Abdomen soft, nondistended, no percussion tenderness, nontender to palpation; no rigidity, guarding, rebound tenderness  Lab Results:  Recent Labs    05/15/22 0051 05/16/22 0454  WBC 10.0 7.1  HGB 14.3 12.5  HCT 42.4 38.3  PLT 269 232   BMET Recent Labs    05/16/22 0454 05/17/22 0500  NA 140 137  K 3.2* 3.4*  CL 107 110  CO2 25 18*  GLUCOSE 99 81  BUN 19 10  CREATININE 0.74 0.56  CALCIUM 8.4* 8.1*   PT/INR No results for input(s): "LABPROT", "INR" in the last 72 hours.  Studies/Results: DG Abd Portable 1V-Small Bowel Obstruction Protocol-initial, 8 hr delay  Result Date: 05/16/2022 CLINICAL DATA:  8 hour follow-up EXAM: PORTABLE ABDOMEN - 1 VIEW COMPARISON:  05/16/2022 FINDINGS: NG tube is in the stomach. Oral contrast material is noted throughout the colon. No dilated small bowel loops. No organomegaly or free air. Visualized lungs clear. IMPRESSION: Oral contrast material within the colon. No evidence of small-bowel obstruction.  Electronically Signed   By: Rolm Baptise M.D.   On: 05/16/2022 21:03   DG Abd Portable 1V  Result Date: 05/16/2022 CLINICAL DATA:  Small bowel obstruction suspected EXAM: PORTABLE ABDOMEN - 1 VIEW COMPARISON:  05/16/2022, 8:28 a.m. FINDINGS: Enteric contrast opacification of the stomach and duodenum. Gas-filled, although not significantly distended small bowel and colon throughout the abdomen and pelvis with gas present to the rectum. No free air. IMPRESSION: Enteric contrast opacification of the stomach and duodenum. Gas-filled, although not significantly distended small bowel and colon throughout the abdomen and pelvis with gas present to the rectum. Electronically Signed   By: Delanna Ahmadi M.D.   On: 05/16/2022 12:50   DG Abd 2 Views  Result Date: 05/16/2022 CLINICAL DATA:  Abdominal pain. EXAM: ABDOMEN - 2 VIEW COMPARISON:  CT scan 05/15/2022 FINDINGS: NG tube tip is in the distal stomach. No gaseous small bowel dilatation to suggest obstruction. Air in stool is seen scattered along the length of a nondilated colon. Convex leftward lumbar scoliosis evident. IMPRESSION: NG tube tip is in the distal stomach. Diffuse gaseous distention of small bowel and colon with a nonobstructive pattern. Electronically Signed   By: Misty Stanley M.D.   On: 05/16/2022 10:43    Anti-infectives: Anti-infectives (From admission, onward)    None       Assessment/Plan:  Patient is 73 year old female who is admitted with concern for small bowel obstruction.  -Patient small bowel obstruction protocol demonstrated contrast in the colon at 8 hours.  NG tube was removed and clear liquid diet was  initiated -Advance to soft diet -IV fluids per primary team -Continue Dulcolax suppositories -PRN pain control and antiemetics -Patient stable for discharge from general surgery standpoint if she is able to tolerate solid food without nausea, vomiting, or abdominal pain -Appreciate hospitalist recommendations   LOS: 2  days    [place] 05/17/2022

## 2022-07-23 DIAGNOSIS — F064 Anxiety disorder due to known physiological condition: Secondary | ICD-10-CM | POA: Diagnosis not present

## 2022-07-23 DIAGNOSIS — I10 Essential (primary) hypertension: Secondary | ICD-10-CM | POA: Diagnosis not present

## 2022-07-23 DIAGNOSIS — Z716 Tobacco abuse counseling: Secondary | ICD-10-CM | POA: Diagnosis not present

## 2022-07-23 DIAGNOSIS — M818 Other osteoporosis without current pathological fracture: Secondary | ICD-10-CM | POA: Diagnosis not present

## 2022-07-23 DIAGNOSIS — Z0001 Encounter for general adult medical examination with abnormal findings: Secondary | ICD-10-CM | POA: Diagnosis not present

## 2022-07-23 DIAGNOSIS — F331 Major depressive disorder, recurrent, moderate: Secondary | ICD-10-CM | POA: Diagnosis not present

## 2022-07-23 DIAGNOSIS — G2581 Restless legs syndrome: Secondary | ICD-10-CM | POA: Diagnosis not present

## 2022-09-11 DIAGNOSIS — M546 Pain in thoracic spine: Secondary | ICD-10-CM | POA: Diagnosis not present

## 2022-09-11 DIAGNOSIS — M9903 Segmental and somatic dysfunction of lumbar region: Secondary | ICD-10-CM | POA: Diagnosis not present

## 2022-09-11 DIAGNOSIS — M542 Cervicalgia: Secondary | ICD-10-CM | POA: Diagnosis not present

## 2022-09-11 DIAGNOSIS — M62830 Muscle spasm of back: Secondary | ICD-10-CM | POA: Diagnosis not present

## 2022-09-11 DIAGNOSIS — M9901 Segmental and somatic dysfunction of cervical region: Secondary | ICD-10-CM | POA: Diagnosis not present

## 2022-09-11 DIAGNOSIS — M9902 Segmental and somatic dysfunction of thoracic region: Secondary | ICD-10-CM | POA: Diagnosis not present

## 2022-10-06 DIAGNOSIS — H2513 Age-related nuclear cataract, bilateral: Secondary | ICD-10-CM | POA: Diagnosis not present

## 2022-10-06 DIAGNOSIS — H401133 Primary open-angle glaucoma, bilateral, severe stage: Secondary | ICD-10-CM | POA: Diagnosis not present

## 2022-10-20 DIAGNOSIS — Z1231 Encounter for screening mammogram for malignant neoplasm of breast: Secondary | ICD-10-CM | POA: Diagnosis not present

## 2022-10-22 DIAGNOSIS — C3490 Malignant neoplasm of unspecified part of unspecified bronchus or lung: Secondary | ICD-10-CM | POA: Diagnosis not present

## 2022-10-22 DIAGNOSIS — K5750 Diverticulosis of both small and large intestine without perforation or abscess without bleeding: Secondary | ICD-10-CM | POA: Diagnosis not present

## 2022-10-22 DIAGNOSIS — I10 Essential (primary) hypertension: Secondary | ICD-10-CM | POA: Diagnosis not present

## 2022-10-22 DIAGNOSIS — F064 Anxiety disorder due to known physiological condition: Secondary | ICD-10-CM | POA: Diagnosis not present

## 2022-10-22 DIAGNOSIS — E559 Vitamin D deficiency, unspecified: Secondary | ICD-10-CM | POA: Diagnosis not present

## 2022-11-09 ENCOUNTER — Encounter: Payer: Self-pay | Admitting: Pulmonary Disease

## 2022-11-09 ENCOUNTER — Ambulatory Visit (INDEPENDENT_AMBULATORY_CARE_PROVIDER_SITE_OTHER): Payer: Medicare Other | Admitting: Pulmonary Disease

## 2022-11-09 VITALS — BP 130/70 | HR 74 | Ht 63.0 in | Wt 117.0 lb

## 2022-11-09 DIAGNOSIS — C3431 Malignant neoplasm of lower lobe, right bronchus or lung: Secondary | ICD-10-CM

## 2022-11-09 DIAGNOSIS — J449 Chronic obstructive pulmonary disease, unspecified: Secondary | ICD-10-CM

## 2022-11-09 DIAGNOSIS — Z923 Personal history of irradiation: Secondary | ICD-10-CM | POA: Diagnosis not present

## 2022-11-09 DIAGNOSIS — F17200 Nicotine dependence, unspecified, uncomplicated: Secondary | ICD-10-CM

## 2022-11-09 NOTE — Patient Instructions (Signed)
Thank you for visiting Dr. Tonia Brooms at [facility]. Today we recommend the following:  I will reach out to Rad onc for you.   Return in about 1 year (around 11/09/2023), or if symptoms worsen or fail to improve, for with APP or Dr. Tonia Brooms.    Please do your part to reduce the spread of COVID-19.

## 2022-11-09 NOTE — Progress Notes (Signed)
Synopsis: Referred in September 2023 for lung mass by Renaye Rakers, MD  Subjective:   PATIENT ID: Carla Lane GENDER: female DOB: [birth year], MRN: [id number]  Chief Complaint  Patient presents with   Follow-up    F/up    This is a 72 year old female, past medical history of hypertension, current every day smoker.Patient had a lung cancer screening CT on 09/04/2021 which was found to have a right lower lobe 26.5 mm lung nodule.  She was referred for further evaluation.  Patient has no respiratory complaints today.  She is retired from [employer].  Has lived all over the country.  She has a longstanding history of smoking for the last 60 years.  She states that she is intermittently compliant with her blood pressure medications.  She does not routinely take her blood pressure medications every day.  I told her that it is important for her to follow-up with her primary care doctor to discuss this.  OV 02/16/2022: Patient was initially seen in the office after having abnormal lung cancer screening CT imaging.  She was scheduled for bronchoscopy unfortunately she became sick her husband consent became sick we had to reschedule her bronchoscopy.  She still was not feeling better after several weeks we rescheduled her bronchoscopy for a second time.  She followed up in the office.  Decision was made to undergo procedure on 02/17/2022.  She is here today to discuss any questions that she may have regarding planned bronchoscopy since it has been several weeks due to rescheduling.  OV 04/15/2022: Patient here today for follow-up. Patient was referred to radiation oncology for completion of SBRT.  Patient was last seen in the office on 02/27/2022 after bronchoscopy.  She was given a LAMA inhaler at last office visit.  Evidence of emphysema on previous CT imaging.  She had PFTs completed prior to today's office visit.  Here today to review results of pulmonary function test.  OV 11/09/2022: Here today for  follow-up.  Was diagnosed with non-small cell lung cancer treated with SBRT.  Last office visit with radiation oncology was in January.  She has been doing well breathing fine.  Still smoking unfortunately.  She has no desire to quit.  We talked about this today.  She is also not using any inhalers and she does not want any inhalers because she knows that she is not going to use them and she does not want to waste money on them.    Past Medical History:  Diagnosis Date   Anemia    during menstruating years   Anxiety    Arthritis    Cancer (HCC)    squamous (tip of nose) Basal - top of shoulder, top of each ears and top of forehead   Cataracts, bilateral 05/2017   Colon polyps    Depression    Dyspnea 11/2021   GERD (gastroesophageal reflux disease)    Hypertension    does not routinely take her blood pressure medications every day.   Pneumonia    Primary open angle glaucoma 10/2015   Bilateral   Rapid heart rate    during pre-menopause - thinks it was due to a HTN medication   Restless leg syndrome 07/16/2020   Right lower lobe lung mass 09/04/2021     No family history on file.   Past Surgical History:  Procedure Laterality Date   APPENDECTOMY     BRONCHIAL BIOPSY  02/17/2022   Procedure: BRONCHIAL BIOPSIES;  Surgeon: Audie Box  L, DO;  Location: [facility];  Service: Pulmonary;;   BRONCHIAL BRUSHINGS  02/17/2022   Procedure: BRONCHIAL BRUSHINGS;  Surgeon: Josephine Igo, DO;  Location: [facility];  Service: Pulmonary;;   BRONCHIAL NEEDLE ASPIRATION BIOPSY  02/17/2022   Procedure: BRONCHIAL NEEDLE ASPIRATION BIOPSIES;  Surgeon: Josephine Igo, DO;  Location: [facility];  Service: Pulmonary;;   COLONOSCOPY     DENTAL SURGERY     FINE NEEDLE ASPIRATION  02/17/2022   Procedure: FINE NEEDLE ASPIRATION (FNA) LINEAR;  Surgeon: Josephine Igo, DO;  Location: [facility];  Service: Pulmonary;;   LAPAROSCOPIC SALPINGO OOPHERECTOMY     LINGUAL FRENECTOMY     at 9  years   MOHS SURGERY     VIDEO BRONCHOSCOPY WITH ENDOBRONCHIAL ULTRASOUND Bilateral 02/17/2022   Procedure: VIDEO BRONCHOSCOPY WITH ENDOBRONCHIAL ULTRASOUND;  Surgeon: Josephine Igo, DO;  Location: [facility];  Service: Pulmonary;  Laterality: Bilateral;    Social History   Socioeconomic History   Marital status: Married    Spouse name: Not on file   Number of children: Not on file   Years of education: Not on file   Highest education level: Not on file  Occupational History   Not on file  Tobacco Use   Smoking status: Every Day    Current packs/day: 1.00    Average packs/day: 1 pack/day for 60.0 years (60.0 ttl pk-yrs)    Types: Cigarettes   Smokeless tobacco: Never   Tobacco comments:    Smoking 3 packs of cigarettes a week. 11/09/2022 Tay  Vaping Use   Vaping status: Former   Devices: Did a trial study for vapes  Substance and Sexual Activity   Alcohol use: Yes    Comment: occasional   Drug use: Never   Sexual activity: Not Currently    Birth control/protection: Post-menopausal  Other Topics Concern   Not on file  Social History Narrative   Not on file   Social Determinants of Health   Financial Resource Strain: Not on file  Food Insecurity: No Food Insecurity (05/15/2022)   Hunger Vital Sign    Worried About Running Out of Food in the Last Year: Never true    Ran Out of Food in the Last Year: Never true  Transportation Needs: No Transportation Needs (05/15/2022)   PRAPARE - Administrator, Civil Service (Medical): No    Lack of Transportation (Non-Medical): No  Physical Activity: Not on file  Stress: Not on file  Social Connections: Not on file  Intimate Partner Violence: Not At Risk (05/15/2022)   Humiliation, Afraid, Rape, and Kick questionnaire    Fear of Current or Ex-Partner: No    Emotionally Abused: No    Physically Abused: No    Sexually Abused: No     Allergies  Allergen Reactions   Codeine Other (See Comments)    White spots on  throat, Looks like strep throat (allergic reaction)     Outpatient Medications Prior to Visit  Medication Sig Dispense Refill   albuterol (VENTOLIN HFA) 108 (90 Base) MCG/ACT inhaler Inhale 2 puffs into the lungs every 6 (six) hours as needed for wheezing or shortness of breath. 8 g 6   aspirin EC 325 MG tablet Take 325 mg by mouth every 6 (six) hours as needed for moderate pain.     docusate sodium (COLACE) 100 MG capsule Take 1 capsule (100 mg total) by mouth 2 (two) times daily. 60 capsule 1   Iodine TINC Apply 1  Application topically 2 (two) times a week.     latanoprost (XALATAN) 0.005 % ophthalmic solution Place 1 drop into both eyes at bedtime.     Multiple Vitamins-Minerals (EMERGEN-C IMMUNE PLUS) PACK Take 1 packet by mouth daily.     nicotine (NICODERM CQ - DOSED IN MG/24 HOURS) 14 mg/24hr patch Place 14 mg onto the skin daily as needed (nicotine dependence).     Omega-3 Fatty Acids (SALMON OIL PO) Take 1 capsule by mouth 3 (three) times a week.     ondansetron (ZOFRAN-ODT) 8 MG disintegrating tablet Take 1 tablet (8 mg total) by mouth every 8 (eight) hours as needed for nausea or vomiting. 20 tablet 0   phenol (CHLORASEPTIC) 1.4 % LIQD Use as directed 2 sprays in the mouth or throat as needed for throat irritation / pain.     polyethylene glycol (MIRALAX) 17 g packet Take 17 g by mouth daily as needed. 28 each 0   sacubitril-valsartan (ENTRESTO) 49-51 MG Take 0.5 tablets by mouth daily.     Vitamin D, Ergocalciferol, (DRISDOL) 1.25 MG (50000 UNIT) CAPS capsule Take 50,000 Units by mouth every Monday.     No facility-administered medications prior to visit.    Review of Systems  Constitutional:  Negative for chills, fever, malaise/fatigue and weight loss.  HENT:  Negative for hearing loss, sore throat and tinnitus.   Eyes:  Negative for blurred vision and double vision.  Respiratory:  Negative for cough, hemoptysis, sputum production, shortness of breath, wheezing and stridor.    Cardiovascular:  Negative for chest pain, palpitations, orthopnea, leg swelling and PND.  Gastrointestinal:  Negative for abdominal pain, constipation, diarrhea, heartburn, nausea and vomiting.  Genitourinary:  Negative for dysuria, hematuria and urgency.  Musculoskeletal:  Negative for joint pain and myalgias.  Skin:  Negative for itching and rash.  Neurological:  Negative for dizziness, tingling, weakness and headaches.  Endo/Heme/Allergies:  Negative for environmental allergies. Does not bruise/bleed easily.  Psychiatric/Behavioral:  Negative for depression. The patient is not nervous/anxious and does not have insomnia.   All other systems reviewed and are negative.    Objective:  Physical Exam Vitals reviewed.  Constitutional:      General: She is not in acute distress.    Appearance: She is well-developed.  HENT:     Head: Normocephalic and atraumatic.     Mouth/Throat:     Pharynx: No oropharyngeal exudate.  Eyes:     Conjunctiva/sclera: Conjunctivae normal.     Pupils: Pupils are equal, round, and reactive to light.  Neck:     Vascular: No JVD.     Trachea: No tracheal deviation.     Comments: Loss of supraclavicular fat Cardiovascular:     Rate and Rhythm: Normal rate and regular rhythm.     Heart sounds: S1 normal and S2 normal.     Comments: Distant heart tones Pulmonary:     Effort: No tachypnea or accessory muscle usage.     Breath sounds: No stridor. Decreased breath sounds (throughout all lung fields) present. No wheezing, rhonchi or rales.  Abdominal:     General: Bowel sounds are normal. There is no distension.     Palpations: Abdomen is soft.     Tenderness: There is no abdominal tenderness.  Musculoskeletal:        General: Deformity (muscle wasting ) present.  Skin:    General: Skin is warm and dry.     Capillary Refill: Capillary refill takes less than 2 seconds.  Findings: No rash.  Neurological:     Mental Status: She is alert and oriented to  person, place, and time.  Psychiatric:        Behavior: Behavior normal.      Vitals:   11/09/22 1315  BP: 130/70  Pulse: 74  SpO2: 96%  Weight: 117 lb (53.1 kg)  Height: 5\' 3"  (1.6 m)   96% on RA BMI Readings from Last 3 Encounters:  11/09/22 20.73 kg/m  05/15/22 19.97 kg/m  04/15/22 20.27 kg/m   Wt Readings from Last 3 Encounters:  11/09/22 117 lb (53.1 kg)  05/15/22 120 lb (54.4 kg)  04/15/22 121 lb 12.8 oz (55.2 kg)     CBC    Component Value Date/Time   WBC 7.1 05/16/2022 0454   RBC 4.18 05/16/2022 0454   HGB 12.5 05/16/2022 0454   HCT 38.3 05/16/2022 0454   PLT 232 05/16/2022 0454   MCV 91.6 05/16/2022 0454   MCH 29.9 05/16/2022 0454   MCHC 32.6 05/16/2022 0454   RDW 13.4 05/16/2022 0454   LYMPHSABS 1.4 05/16/2022 0454   MONOABS 0.7 05/16/2022 0454   EOSABS 0.0 05/16/2022 0454   BASOSABS 0.0 05/16/2022 0454     Chest Imaging: LDCT June: 26mm RLL lung nodule Concerning for malignancy The patient's images have been independently reviewed by me.    Nuclear medicine PET scan 12/04/2021: Hypermetabolic lesion 2.6 cm pulmonary nodule concerning for primary bronchogenic carcinoma. The patient's images have been independently reviewed by me.    Pulmonary Functions Testing Results:    Latest Ref Rng & Units 04/15/2022    8:48 AM  PFT Results  FVC-Pre L 3.02   FVC-Predicted Pre % 99   FVC-Post L 3.29   FVC-Predicted Post % 108   Pre FEV1/FVC % % 28   Post FEV1/FCV % % 58   FEV1-Pre L 0.85   FEV1-Predicted Pre % 37   FEV1-Post L 1.90   DLCO uncorrected ml/min/mmHg 13.15   DLCO UNC% % 65   DLCO corrected ml/min/mmHg 13.00   DLCO COR %Predicted % 65   DLVA Predicted % 60   TLC L 6.44   TLC % Predicted % 123   RV % Predicted % 140     FeNO:   Pathology:   Echocardiogram:   Heart Catheterization:     Assessment & Plan:     ICD-10-CM   1. Primary cancer of right lower lobe of lung (HCC)  C34.31     2. Current smoker  F17.200      3. S/P radiation therapy  Z92.3     4. Stage 2 moderate COPD by GOLD classification (HCC)  J44.9        Discussion:  This is a 72 year old female longstanding history of tobacco use diagnosed with non-small cell lung cancer taken for radiation treatments.  She is still smoking.  She has no desire to quit smoking.  Plan: For her COPD management would prefer that she uses a maintenance inhaler. She refuses using any inhalers.  She does have an albuterol inhaler. She is high risk for COPD exacerbation. Especially with her history of cancer and COPD on top of continuing to smoke. We tried various options in the past to quit smoking. I have encouraged her to follow-up with her primary care provider if there is any needs in the future. Have sent a message over to the radiation oncology team to have her imaging follow-up to be completed with them as they normally do  this just wanted double check so we do not both order follow-up CT imaging. I happy to do this if needed. Otherwise she can see [place] in 1 year or as needed. Patient needs to quit smoking.    Current Outpatient Medications:    albuterol (VENTOLIN HFA) 108 (90 Base) MCG/ACT inhaler, Inhale 2 puffs into the lungs every 6 (six) hours as needed for wheezing or shortness of breath., Disp: 8 g, Rfl: 6   aspirin EC 325 MG tablet, Take 325 mg by mouth every 6 (six) hours as needed for moderate pain., Disp: , Rfl:    docusate sodium (COLACE) 100 MG capsule, Take 1 capsule (100 mg total) by mouth 2 (two) times daily., Disp: 60 capsule, Rfl: 1   Iodine TINC, Apply 1 Application topically 2 (two) times a week., Disp: , Rfl:    latanoprost (XALATAN) 0.005 % ophthalmic solution, Place 1 drop into both eyes at bedtime., Disp: , Rfl:    Multiple Vitamins-Minerals (EMERGEN-C IMMUNE PLUS) PACK, Take 1 packet by mouth daily., Disp: , Rfl:    nicotine (NICODERM CQ - DOSED IN MG/24 HOURS) 14 mg/24hr patch, Place 14 mg onto the skin daily as needed  (nicotine dependence)., Disp: , Rfl:    Omega-3 Fatty Acids (SALMON OIL PO), Take 1 capsule by mouth 3 (three) times a week., Disp: , Rfl:    ondansetron (ZOFRAN-ODT) 8 MG disintegrating tablet, Take 1 tablet (8 mg total) by mouth every 8 (eight) hours as needed for nausea or vomiting., Disp: 20 tablet, Rfl: 0   phenol (CHLORASEPTIC) 1.4 % LIQD, Use as directed 2 sprays in the mouth or throat as needed for throat irritation / pain., Disp: , Rfl:    polyethylene glycol (MIRALAX) 17 g packet, Take 17 g by mouth daily as needed., Disp: 28 each, Rfl: 0   sacubitril-valsartan (ENTRESTO) 49-51 MG, Take 0.5 tablets by mouth daily., Disp: , Rfl:    Vitamin D, Ergocalciferol, (DRISDOL) 1.25 MG (50000 UNIT) CAPS capsule, Take 50,000 Units by mouth every Monday., Disp: , Rfl:     Josephine Igo, DO [facility] 11/09/2022 1:28 PM

## 2022-11-10 ENCOUNTER — Telehealth: Payer: Self-pay | Admitting: *Deleted

## 2022-11-10 ENCOUNTER — Other Ambulatory Visit: Payer: Self-pay | Admitting: Urology

## 2022-11-10 DIAGNOSIS — C3431 Malignant neoplasm of lower lobe, right bronchus or lung: Secondary | ICD-10-CM

## 2022-11-10 NOTE — Telephone Encounter (Signed)
CALLED PATIENT TO INFORM OF CT FOR 11-13-22 @ [facility], - ARRIVAL TIME- 10:45 AM- LABS TO BE DRAWN I-STAT, CT TO FOLLOW, NO RESTRICTIONS TO CT, PATIENT TO RECEIVE RESULTS FROM ASHLYN BRUNING ON 11-18-22 @ 10 AM VIA TELEPHONE, SPOKE WITH PATIENT AND SHE IS AWARE OF THESE APPTS. AND THE INSTRUCTIONS

## 2022-11-13 ENCOUNTER — Ambulatory Visit (HOSPITAL_COMMUNITY)
Admission: RE | Admit: 2022-11-13 | Discharge: 2022-11-13 | Disposition: A | Discharge status: Home / Self Care | Payer: Medicare Other | Source: Ambulatory Visit | Attending: Urology | Admitting: Urology

## 2022-11-13 DIAGNOSIS — I700 Atherosclerosis of aorta: Secondary | ICD-10-CM | POA: Diagnosis not present

## 2022-11-13 DIAGNOSIS — J432 Centrilobular emphysema: Secondary | ICD-10-CM | POA: Diagnosis not present

## 2022-11-13 DIAGNOSIS — C3431 Malignant neoplasm of lower lobe, right bronchus or lung: Secondary | ICD-10-CM

## 2022-11-13 DIAGNOSIS — C3490 Malignant neoplasm of unspecified part of unspecified bronchus or lung: Secondary | ICD-10-CM | POA: Diagnosis not present

## 2022-11-13 MED ORDER — IOHEXOL 300 MG/ML  SOLN
75.0000 mL | Freq: Once | INTRAMUSCULAR | Status: AC | PRN
  Administered 2022-11-13: 75 mL via INTRAVENOUS

## 2022-11-18 ENCOUNTER — Encounter: Payer: Self-pay | Admitting: Urology

## 2022-11-18 ENCOUNTER — Ambulatory Visit
Admission: RE | Admit: 2022-11-18 | Discharge: 2022-11-18 | Disposition: A | Discharge status: Home / Self Care | Payer: Medicare Other | Source: Ambulatory Visit | Attending: Urology | Admitting: Urology

## 2022-11-18 DIAGNOSIS — F17210 Nicotine dependence, cigarettes, uncomplicated: Secondary | ICD-10-CM | POA: Diagnosis not present

## 2022-11-18 DIAGNOSIS — C3431 Malignant neoplasm of lower lobe, right bronchus or lung: Secondary | ICD-10-CM

## 2022-11-18 NOTE — Progress Notes (Signed)
Telephone nursing appointment for patient to review most recent scan results. I verified patient's identity x2 and began nursing interview.   Patient reports doing well. No issues conveyed at this time.   Meaningful use complete.   Patient aware of their 10:00am-11/18/2022 telephone appointment w/ Ashlyn Bruning PA-C. I left my extension [phone] in case patient needs anything. Patient verbalized understanding. This concludes the nursing interview.   Patient contact [phone]     Ruel Favors, LPN

## 2022-11-18 NOTE — Progress Notes (Signed)
Radiation Oncology         [phone] ________________________________  Name: Carla Lane MRN: [id number]  Date: 11/18/2022  DOB: 08/07/1950  Post Treatment Note  CC: Renaye Rakers, MD  Renaye Rakers, MD  Diagnosis:   72 yo woman with a 3.8 cm non-small cell carcinoma of the right lower lobe of the lung - Stage IB (cT2a, cN0, cM0)     Interval Since Last Radiation:  8 months  03/11/2022 through 03/23/2022 Site Technique Total Dose (Gy) Dose per Fx (Gy) Completed Fx Beam Energies  Lung, Right: Lung_R IMRT 60/60 12 5/5 6XFFF    Narrative:  I spoke with the patient to conduct her routine scheduled follow up visit via telephone to spare the patient unnecessary potential exposure in the healthcare setting during the current COVID-19 pandemic.  The patient was notified in advance and gave permission to proceed with this visit format.  She tolerated radiation therapy relatively well without any bothersome side effects.  She had a recent posttreatment CT chest scan on 11/13/2022 which shows a good treatment response with decreased size of the treated right lower lobe pulmonary nodule with surrounding radiation treatment changes but no evidence of metastatic disease and stable appearance of scattered small groundglass and solid pulmonary nodules.  We reviewed these results today.                          On review of systems, the patient states she is doing well in general.  She did develop some dysphagia following her radiation but this has completely resolved at this point.  She does have some baseline indigestion but this has remained stable and is not interfering with her ability to eat and drink whatever she wants to.  She was hospitalized back in March 2024 with diverticulitis-this was her first episode and she has now fully recovered.  She has a chronic, baseline cough with production of clear to whitish sputum throughout the day, unchanged recently.  She specifically denies increased cough,  shortness of breath, chest pain or hemoptysis.  She has not had any recent fever or chills.  She had a recent follow-up visit with Dr. Tonia Brooms in the pulmonary office on 11/09/2022 and will see him again in 1 year for follow-up.  Overall, she is pleased with her progress to date.  ALLERGIES:  is allergic to codeine.  Meds: Current Outpatient Medications  Medication Sig Dispense Refill   albuterol (VENTOLIN HFA) 108 (90 Base) MCG/ACT inhaler Inhale 2 puffs into the lungs every 6 (six) hours as needed for wheezing or shortness of breath. 8 g 6   aspirin EC 325 MG tablet Take 325 mg by mouth every 6 (six) hours as needed for moderate pain.     docusate sodium (COLACE) 100 MG capsule Take 1 capsule (100 mg total) by mouth 2 (two) times daily. 60 capsule 1   Iodine TINC Apply 1 Application topically 2 (two) times a week.     latanoprost (XALATAN) 0.005 % ophthalmic solution Place 1 drop into both eyes at bedtime.     Multiple Vitamins-Minerals (EMERGEN-C IMMUNE PLUS) PACK Take 1 packet by mouth daily.     nicotine (NICODERM CQ - DOSED IN MG/24 HOURS) 14 mg/24hr patch Place 14 mg onto the skin daily as needed (nicotine dependence).     Omega-3 Fatty Acids (SALMON OIL PO) Take 1 capsule by mouth 3 (three) times a week.     ondansetron (ZOFRAN-ODT) 8 MG  disintegrating tablet Take 1 tablet (8 mg total) by mouth every 8 (eight) hours as needed for nausea or vomiting. 20 tablet 0   phenol (CHLORASEPTIC) 1.4 % LIQD Use as directed 2 sprays in the mouth or throat as needed for throat irritation / pain.     polyethylene glycol (MIRALAX) 17 g packet Take 17 g by mouth daily as needed. 28 each 0   sacubitril-valsartan (ENTRESTO) 49-51 MG Take 0.5 tablets by mouth daily.     Vitamin D, Ergocalciferol, (DRISDOL) 1.25 MG (50000 UNIT) CAPS capsule Take 50,000 Units by mouth every Monday.     No current facility-administered medications for this encounter.    Physical Findings:  vitals were not taken for this  visit.  Pain Assessment Pain Score: 0-No pain/10 Unable to assess due to telephone follow up visit format.  Lab Findings: Lab Results  Component Value Date   WBC 7.1 05/16/2022   HGB 12.5 05/16/2022   HCT 38.3 05/16/2022   MCV 91.6 05/16/2022   PLT 232 05/16/2022     Radiographic Findings: CT Chest W Contrast  Result Date: 11/17/2022 CLINICAL DATA:  Small cell lung cancer; * Tracking Code: BO * EXAM: CT CHEST WITH CONTRAST TECHNIQUE: Multidetector CT imaging of the chest was performed during intravenous contrast administration. RADIATION DOSE REDUCTION: This exam was performed according to the departmental dose-optimization program which includes automated exposure control, adjustment of the mA and/or kV according to patient size and/or use of iterative reconstruction technique. CONTRAST:  75mL OMNIPAQUE IOHEXOL 300 MG/ML  SOLN COMPARISON:  Chest CT dated December 05, 2021 FINDINGS: Cardiovascular: Normal heart size. No pericardial effusion. Normal caliber thoracic aorta with severe atherosclerotic disease. Mild coronary artery calcifications. Mediastinum/Nodes: Esophagus and thyroid are unremarkable. No enlarged lymph nodes seen in the chest. Lungs/Pleura: Central airways are patent. Severe centrilobular emphysema. Interval decreased size of subpleural spiculated right lower lobe nodule measuring 2.1 x 1.3 cm on series 3, image 86, previously 2.9 x 1.8 cm. Increased adjacent linear opacities. Scattered small solid pulmonary nodules are stable. Reference solid nodule of the left upper lobe measuring 3 mm on series 3, image 82. Stable right upper lobe ground-glass measuring 9 and 12 mm on series 3, image 42. No pleural effusion. Upper Abdomen: Stable bilateral adrenal gland adenomas. Musculoskeletal: No chest wall abnormality. No acute or significant osseous findings. IMPRESSION: 1. Decreased size of right lower lobe pulmonary nodule. Increased adjacent linear opacities, likely combination of  atelectasis and postradiation change. Recommend attention on follow-up. 2. No evidence of metastatic disease in the chest. 3. Stable small ground-glass and solid pulmonary nodules. Recommend attention on follow-up. 4. Aortic Atherosclerosis (ICD10-I70.0) and Emphysema (ICD10-J43.9). Electronically Signed   By: Allegra Lai M.D.   On: 11/17/2022 13:04    Impression/Plan: 1. 72 yo woman with a 3.8 cm non-small cell carcinoma of the right lower lobe of the lung - Stage IB (cT2a, cN0, cM0). She appears to have recovered well from the effects of her recent stereotactic body radiotherapy (SBRT) and is currently without complaints.  She had a recent posttreatment CT chest scan on 11/13/2022 which shows a good treatment response with decreased size of the treated right lower lobe pulmonary nodule with surrounding radiation treatment changes but no evidence of metastatic disease and stable appearance of scattered small groundglass and solid pulmonary nodules.  Therefore, we will plan to proceed with serial surveillance CT chest scans every 6 months with a telephone follow-up thereafter to review results and recommendations.  She appears to  have a good understanding of our recommendations and is comfortable and in agreement with the stated plan.  I will share our discussion with Dr. Tonia Brooms and keep him informed as we move forward.  She knows that she is welcome to call at anytime in the interim with any questions or concerns.      I personally spent 30 minutes in this encounter including chart review, reviewing radiological studies, telephone conversation with the patient, entering orders, coordinating care and completing documentation.    Marguarite Arbour, PA-C

## 2022-11-30 ENCOUNTER — Ambulatory Visit
Admission: EM | Admit: 2022-11-30 | Discharge: 2022-11-30 | Disposition: A | Discharge status: Home / Self Care | Payer: Medicare Other | Attending: Family Medicine | Admitting: Family Medicine

## 2022-11-30 DIAGNOSIS — J441 Chronic obstructive pulmonary disease with (acute) exacerbation: Secondary | ICD-10-CM

## 2022-11-30 DIAGNOSIS — J22 Unspecified acute lower respiratory infection: Secondary | ICD-10-CM

## 2022-11-30 MED ORDER — ALBUTEROL SULFATE HFA 108 (90 BASE) MCG/ACT IN AERS: 2.0000 | INHALATION_SPRAY | RESPIRATORY_TRACT | 0 refills | Status: DC | PRN

## 2022-11-30 MED ORDER — AMOXICILLIN-POT CLAVULANATE 400-57 MG/5ML PO SUSR: 875.0000 mg | Freq: Two times a day (BID) | ORAL | 0 refills | Status: AC

## 2022-11-30 NOTE — Discharge Instructions (Signed)
In addition to the prescribed medications, I recommend you take plain Mucinex twice daily, and you may take Delsym as needed.  Follow-up with your primary care provider for a recheck toward the end of the week, follow-up sooner if worsening at any time

## 2022-11-30 NOTE — ED Provider Notes (Signed)
[facility]    CSN: [id number] Arrival date & time: 11/30/22  1108      History   Chief Complaint Chief Complaint  Patient presents with   Headache   Cough    HPI Carla Lane is a 72 y.o. female.   Patient presenting today with little over a week of persistent body aches, chills, productive hacking cough, headache, ear pain, sinus congestion, fatigue, malaise.  States her cough has become significantly worse and she is coughing up thick phlegm, having some chest tightness.  Denies chest pain, significant shortness of breath, abdominal pain, nausea vomiting or diarrhea.  So far taking Delsym with no relief.  History of COPD not currently on any inhalers though states she has been on an albuterol rescue inhaler in the past.  Does also have a history of lung cancer, pneumonia, heart failure.    Past Medical History:  Diagnosis Date   Anemia    during menstruating years   Anxiety    Arthritis    Cancer (HCC)    squamous (tip of nose) Basal - top of shoulder, top of each ears and top of forehead   Cataracts, bilateral 05/2017   Colon polyps    Depression    Dyspnea 11/2021   GERD (gastroesophageal reflux disease)    Hypertension    does not routinely take her blood pressure medications every day.   Pneumonia    Primary open angle glaucoma 10/2015   Bilateral   Rapid heart rate    during pre-menopause - thinks it was due to a HTN medication   Restless leg syndrome 07/16/2020   Right lower lobe lung mass 09/04/2021    Patient Active Problem List   Diagnosis Date Noted   Chronic systolic heart failure (HCC) 05/17/2022   Hx of cancer of lung 05/17/2022   SBO (small bowel obstruction) (HCC) 05/15/2022   Tobacco use disorder 05/15/2022   COPD (chronic obstructive pulmonary disease) (HCC) 02/27/2022   Primary cancer of right lower lobe of lung (HCC) 11/26/2021   Hypertension 10/09/2016    Past Surgical History:  Procedure Laterality Date   APPENDECTOMY      BRONCHIAL BIOPSY  02/17/2022   Procedure: BRONCHIAL BIOPSIES;  Surgeon: Josephine Igo, DO;  Location: [facility];  Service: Pulmonary;;   BRONCHIAL BRUSHINGS  02/17/2022   Procedure: BRONCHIAL BRUSHINGS;  Surgeon: Josephine Igo, DO;  Location: [facility];  Service: Pulmonary;;   BRONCHIAL NEEDLE ASPIRATION BIOPSY  02/17/2022   Procedure: BRONCHIAL NEEDLE ASPIRATION BIOPSIES;  Surgeon: Josephine Igo, DO;  Location: [facility];  Service: Pulmonary;;   COLONOSCOPY     DENTAL SURGERY     FINE NEEDLE ASPIRATION  02/17/2022   Procedure: FINE NEEDLE ASPIRATION (FNA) LINEAR;  Surgeon: Josephine Igo, DO;  Location: [facility];  Service: Pulmonary;;   LAPAROSCOPIC SALPINGO OOPHERECTOMY     LINGUAL FRENECTOMY     at 9 years   MOHS SURGERY     VIDEO BRONCHOSCOPY WITH ENDOBRONCHIAL ULTRASOUND Bilateral 02/17/2022   Procedure: VIDEO BRONCHOSCOPY WITH ENDOBRONCHIAL ULTRASOUND;  Surgeon: Josephine Igo, DO;  Location: [facility];  Service: Pulmonary;  Laterality: Bilateral;    OB History   No obstetric history on file.      Home Medications    Prior to Admission medications   Medication Sig Start Date End Date Taking? Authorizing Provider  albuterol (VENTOLIN HFA) 108 (90 Base) MCG/ACT inhaler Inhale 2 puffs into the lungs every 4 (four) hours as needed  for wheezing or shortness of breath. 11/30/22  Yes Particia Nearing, PA-C  amoxicillin-clavulanate (AUGMENTIN) 400-57 MG/5ML suspension Take 10.9 mLs (875 mg total) by mouth 2 (two) times daily for 7 days. 11/30/22 12/07/22 Yes Particia Nearing, PA-C  aspirin EC 325 MG tablet Take 325 mg by mouth every 6 (six) hours as needed for moderate pain.   Yes [provider]  Iodine TINC Apply 1 Application topically 2 (two) times a week.   Yes [provider]  latanoprost (XALATAN) 0.005 % ophthalmic solution Place 1 drop into both eyes at bedtime.   Yes [provider]  Multiple Vitamins-Minerals  (EMERGEN-C IMMUNE PLUS) PACK Take 1 packet by mouth daily.   Yes [provider]  Omega-3 Fatty Acids (SALMON OIL PO) Take 1 capsule by mouth 3 (three) times a week.   Yes [provider]  sacubitril-valsartan (ENTRESTO) 49-51 MG Take 0.5 tablets by mouth daily. 11/10/21  Yes [provider]  Vitamin D, Ergocalciferol, (DRISDOL) 1.25 MG (50000 UNIT) CAPS capsule Take 50,000 Units by mouth every Monday. 08/18/21  Yes [provider]  albuterol (VENTOLIN HFA) 108 (90 Base) MCG/ACT inhaler Inhale 2 puffs into the lungs every 6 (six) hours as needed for wheezing or shortness of breath. 12/25/21   Cobb, Ruby Cola, NP  docusate sodium (COLACE) 100 MG capsule Take 1 capsule (100 mg total) by mouth 2 (two) times daily. 05/17/22 05/17/23  Vassie Loll, MD  nicotine (NICODERM CQ - DOSED IN MG/24 HOURS) 14 mg/24hr patch Place 14 mg onto the skin daily as needed (nicotine dependence).    [provider]  ondansetron (ZOFRAN-ODT) 8 MG disintegrating tablet Take 1 tablet (8 mg total) by mouth every 8 (eight) hours as needed for nausea or vomiting. 05/17/22   Vassie Loll, MD  phenol (CHLORASEPTIC) 1.4 % LIQD Use as directed 2 sprays in the mouth or throat as needed for throat irritation / pain.    [provider]  polyethylene glycol (MIRALAX) 17 g packet Take 17 g by mouth daily as needed. 05/17/22   Vassie Loll, MD    Family History History reviewed. No pertinent family history.  Social History Social History   Tobacco Use   Smoking status: Every Day    Current packs/day: 1.00    Average packs/day: 1 pack/day for 60.0 years (60.0 ttl pk-yrs)    Types: Cigarettes   Smokeless tobacco: Never   Tobacco comments:    Smoking 3 packs of cigarettes a week. 11/09/2022 Tay  Vaping Use   Vaping status: Former   Devices: Did a trial study for vapes  Substance Use Topics   Alcohol use: Yes    Comment: occasional   Drug use: Never     Allergies    Codeine   Review of Systems Review of Systems Per HPI  Physical Exam Triage Vital Signs ED Triage Vitals  Encounter Vitals Group     BP 11/30/22 1143 (!) 180/80     Systolic BP Percentile --      Diastolic BP Percentile --      Pulse Rate 11/30/22 1143 92     Resp 11/30/22 1143 18     Temp 11/30/22 1143 99.2 F (37.3 C)     Temp Source 11/30/22 1143 Oral     SpO2 11/30/22 1143 93 %     Weight --      Height --      Head Circumference --      Peak Flow --  Pain Score 11/30/22 1146 5     Pain Loc --      Pain Education --      Exclude from Growth Chart --    No data found.  Updated Vital Signs BP (!) 155/81 (BP Location: Right Arm)   Pulse 92   Temp 99.2 F (37.3 C) (Oral)   Resp 18   SpO2 93%   Visual Acuity Right Eye Distance:   Left Eye Distance:   Bilateral Distance:    Right Eye Near:   Left Eye Near:    Bilateral Near:     Physical Exam Vitals and nursing note reviewed.  Constitutional:      Appearance: Normal appearance.  HENT:     Head: Atraumatic.     Right Ear: Tympanic membrane and external ear normal.     Left Ear: Tympanic membrane and external ear normal.     Nose: Congestion present.     Mouth/Throat:     Mouth: Mucous membranes are moist.     Pharynx: Posterior oropharyngeal erythema present.  Eyes:     Extraocular Movements: Extraocular movements intact.     Conjunctiva/sclera: Conjunctivae normal.  Cardiovascular:     Rate and Rhythm: Normal rate and regular rhythm.     Heart sounds: Normal heart sounds.  Pulmonary:     Effort: Pulmonary effort is normal.     Breath sounds: Wheezing present.  Musculoskeletal:        General: Normal range of motion.     Cervical back: Normal range of motion and neck supple.  Skin:    General: Skin is warm and dry.  Neurological:     Mental Status: She is alert and oriented to person, place, and time.  Psychiatric:        Mood and Affect: Mood normal.        Thought Content: Thought  content normal.      UC Treatments / Results  Labs (all labs ordered are listed, but only abnormal results are displayed) Labs Reviewed - No data to display  EKG   Radiology No results found.  Procedures Procedures (including critical care time)  Medications Ordered in UC Medications - No data to display  Initial Impression / Assessment and Plan / UC Course  I have reviewed the triage vital signs and the nursing notes.  Pertinent labs & imaging results that were available during my care of the patient were reviewed by me and considered in my medical decision making (see chart for details).     Given duration worsening course as well as underlying COPD, will treat with Augmentin, albuterol as needed.  She declines steroids today or cough syrup prescription and wishes to take over-the-counter remedies instead.  Return for worsening symptoms.  Final Clinical Impressions(s) / UC Diagnoses   Final diagnoses:  Lower respiratory infection  COPD exacerbation (HCC)     Discharge Instructions      In addition to the prescribed medications, I recommend you take plain Mucinex twice daily, and you may take Delsym as needed.  Follow-up with your primary care provider for a recheck toward the end of the week, follow-up sooner if worsening at any time    ED Prescriptions     Medication Sig Dispense Auth. Provider   amoxicillin-clavulanate (AUGMENTIN) 400-57 MG/5ML suspension Take 10.9 mLs (875 mg total) by mouth 2 (two) times daily for 7 days. 152.6 mL Particia Nearing, PA-C   albuterol (VENTOLIN HFA) 108 (90 Base) MCG/ACT inhaler Inhale  2 puffs into the lungs every 4 (four) hours as needed for wheezing or shortness of breath. 18 g Particia Nearing, [place]      PDMP not reviewed this encounter.   Particia Nearing, [place] 11/30/22 1417

## 2022-11-30 NOTE — ED Triage Notes (Signed)
Body aches, chills, cough, headache, ear pain, sinus pain and pressure that started a week ago. Taking delsym OTC with no relief.

## 2023-02-04 DIAGNOSIS — F419 Anxiety disorder, unspecified: Secondary | ICD-10-CM | POA: Diagnosis not present

## 2023-02-04 DIAGNOSIS — R7303 Prediabetes: Secondary | ICD-10-CM | POA: Diagnosis not present

## 2023-02-04 DIAGNOSIS — K5150 Left sided colitis without complications: Secondary | ICD-10-CM | POA: Diagnosis not present

## 2023-02-04 DIAGNOSIS — C3490 Malignant neoplasm of unspecified part of unspecified bronchus or lung: Secondary | ICD-10-CM | POA: Diagnosis not present

## 2023-02-04 DIAGNOSIS — Z6829 Body mass index (BMI) 29.0-29.9, adult: Secondary | ICD-10-CM | POA: Diagnosis not present

## 2023-02-04 DIAGNOSIS — R7309 Other abnormal glucose: Secondary | ICD-10-CM | POA: Diagnosis not present

## 2023-02-04 DIAGNOSIS — E559 Vitamin D deficiency, unspecified: Secondary | ICD-10-CM | POA: Diagnosis not present

## 2023-02-04 DIAGNOSIS — J159 Unspecified bacterial pneumonia: Secondary | ICD-10-CM | POA: Diagnosis not present

## 2023-02-04 DIAGNOSIS — I10 Essential (primary) hypertension: Secondary | ICD-10-CM | POA: Diagnosis not present

## 2023-02-04 DIAGNOSIS — K5750 Diverticulosis of both small and large intestine without perforation or abscess without bleeding: Secondary | ICD-10-CM | POA: Diagnosis not present

## 2023-03-02 DIAGNOSIS — H401133 Primary open-angle glaucoma, bilateral, severe stage: Secondary | ICD-10-CM | POA: Diagnosis not present

## 2023-03-31 DIAGNOSIS — M9905 Segmental and somatic dysfunction of pelvic region: Secondary | ICD-10-CM | POA: Diagnosis not present

## 2023-03-31 DIAGNOSIS — M546 Pain in thoracic spine: Secondary | ICD-10-CM | POA: Diagnosis not present

## 2023-03-31 DIAGNOSIS — M9903 Segmental and somatic dysfunction of lumbar region: Secondary | ICD-10-CM | POA: Diagnosis not present

## 2023-03-31 DIAGNOSIS — M62830 Muscle spasm of back: Secondary | ICD-10-CM | POA: Diagnosis not present

## 2023-03-31 DIAGNOSIS — M9902 Segmental and somatic dysfunction of thoracic region: Secondary | ICD-10-CM | POA: Diagnosis not present

## 2023-04-02 DIAGNOSIS — M9903 Segmental and somatic dysfunction of lumbar region: Secondary | ICD-10-CM | POA: Diagnosis not present

## 2023-04-02 DIAGNOSIS — M546 Pain in thoracic spine: Secondary | ICD-10-CM | POA: Diagnosis not present

## 2023-04-02 DIAGNOSIS — M9905 Segmental and somatic dysfunction of pelvic region: Secondary | ICD-10-CM | POA: Diagnosis not present

## 2023-04-02 DIAGNOSIS — M62830 Muscle spasm of back: Secondary | ICD-10-CM | POA: Diagnosis not present

## 2023-04-02 DIAGNOSIS — M9902 Segmental and somatic dysfunction of thoracic region: Secondary | ICD-10-CM | POA: Diagnosis not present

## 2023-04-07 DIAGNOSIS — M546 Pain in thoracic spine: Secondary | ICD-10-CM | POA: Diagnosis not present

## 2023-04-07 DIAGNOSIS — M9905 Segmental and somatic dysfunction of pelvic region: Secondary | ICD-10-CM | POA: Diagnosis not present

## 2023-04-07 DIAGNOSIS — M62830 Muscle spasm of back: Secondary | ICD-10-CM | POA: Diagnosis not present

## 2023-04-07 DIAGNOSIS — M9902 Segmental and somatic dysfunction of thoracic region: Secondary | ICD-10-CM | POA: Diagnosis not present

## 2023-04-07 DIAGNOSIS — M9903 Segmental and somatic dysfunction of lumbar region: Secondary | ICD-10-CM | POA: Diagnosis not present

## 2023-04-12 DIAGNOSIS — M62830 Muscle spasm of back: Secondary | ICD-10-CM | POA: Diagnosis not present

## 2023-04-12 DIAGNOSIS — M9903 Segmental and somatic dysfunction of lumbar region: Secondary | ICD-10-CM | POA: Diagnosis not present

## 2023-04-12 DIAGNOSIS — M9902 Segmental and somatic dysfunction of thoracic region: Secondary | ICD-10-CM | POA: Diagnosis not present

## 2023-04-12 DIAGNOSIS — M546 Pain in thoracic spine: Secondary | ICD-10-CM | POA: Diagnosis not present

## 2023-04-12 DIAGNOSIS — M9905 Segmental and somatic dysfunction of pelvic region: Secondary | ICD-10-CM | POA: Diagnosis not present

## 2023-04-14 DIAGNOSIS — M62830 Muscle spasm of back: Secondary | ICD-10-CM | POA: Diagnosis not present

## 2023-04-14 DIAGNOSIS — M9903 Segmental and somatic dysfunction of lumbar region: Secondary | ICD-10-CM | POA: Diagnosis not present

## 2023-04-14 DIAGNOSIS — M9902 Segmental and somatic dysfunction of thoracic region: Secondary | ICD-10-CM | POA: Diagnosis not present

## 2023-04-14 DIAGNOSIS — M546 Pain in thoracic spine: Secondary | ICD-10-CM | POA: Diagnosis not present

## 2023-04-14 DIAGNOSIS — M9905 Segmental and somatic dysfunction of pelvic region: Secondary | ICD-10-CM | POA: Diagnosis not present

## 2023-04-21 DIAGNOSIS — M546 Pain in thoracic spine: Secondary | ICD-10-CM | POA: Diagnosis not present

## 2023-04-21 DIAGNOSIS — M9902 Segmental and somatic dysfunction of thoracic region: Secondary | ICD-10-CM | POA: Diagnosis not present

## 2023-04-21 DIAGNOSIS — M9903 Segmental and somatic dysfunction of lumbar region: Secondary | ICD-10-CM | POA: Diagnosis not present

## 2023-04-21 DIAGNOSIS — M9905 Segmental and somatic dysfunction of pelvic region: Secondary | ICD-10-CM | POA: Diagnosis not present

## 2023-04-21 DIAGNOSIS — M62830 Muscle spasm of back: Secondary | ICD-10-CM | POA: Diagnosis not present

## 2023-05-05 DIAGNOSIS — M9903 Segmental and somatic dysfunction of lumbar region: Secondary | ICD-10-CM | POA: Diagnosis not present

## 2023-05-05 DIAGNOSIS — M62830 Muscle spasm of back: Secondary | ICD-10-CM | POA: Diagnosis not present

## 2023-05-05 DIAGNOSIS — M9902 Segmental and somatic dysfunction of thoracic region: Secondary | ICD-10-CM | POA: Diagnosis not present

## 2023-05-05 DIAGNOSIS — M9905 Segmental and somatic dysfunction of pelvic region: Secondary | ICD-10-CM | POA: Diagnosis not present

## 2023-05-05 DIAGNOSIS — M546 Pain in thoracic spine: Secondary | ICD-10-CM | POA: Diagnosis not present

## 2023-05-18 ENCOUNTER — Ambulatory Visit (HOSPITAL_COMMUNITY)
Admission: RE | Admit: 2023-05-18 | Discharge: 2023-05-18 | Disposition: A | Discharge status: Home / Self Care | Payer: Medicare Other | Source: Ambulatory Visit | Attending: Urology | Admitting: Urology

## 2023-05-18 ENCOUNTER — Encounter (HOSPITAL_COMMUNITY): Payer: Self-pay

## 2023-05-18 DIAGNOSIS — C3431 Malignant neoplasm of lower lobe, right bronchus or lung: Secondary | ICD-10-CM | POA: Insufficient documentation

## 2023-05-18 DIAGNOSIS — I3139 Other pericardial effusion (noninflammatory): Secondary | ICD-10-CM | POA: Diagnosis not present

## 2023-05-18 DIAGNOSIS — R918 Other nonspecific abnormal finding of lung field: Secondary | ICD-10-CM | POA: Diagnosis not present

## 2023-05-18 DIAGNOSIS — C3490 Malignant neoplasm of unspecified part of unspecified bronchus or lung: Secondary | ICD-10-CM | POA: Diagnosis not present

## 2023-05-18 DIAGNOSIS — F064 Anxiety disorder due to known physiological condition: Secondary | ICD-10-CM | POA: Diagnosis not present

## 2023-05-18 DIAGNOSIS — R7309 Other abnormal glucose: Secondary | ICD-10-CM | POA: Diagnosis not present

## 2023-05-18 DIAGNOSIS — I10 Essential (primary) hypertension: Secondary | ICD-10-CM | POA: Diagnosis not present

## 2023-05-18 DIAGNOSIS — J399 Disease of upper respiratory tract, unspecified: Secondary | ICD-10-CM | POA: Diagnosis not present

## 2023-05-18 MED ORDER — IOHEXOL 300 MG/ML  SOLN
80.0000 mL | Freq: Once | INTRAMUSCULAR | Status: AC | PRN
  Administered 2023-05-18: 75 mL via INTRAVENOUS

## 2023-05-19 DIAGNOSIS — M9903 Segmental and somatic dysfunction of lumbar region: Secondary | ICD-10-CM | POA: Diagnosis not present

## 2023-05-19 DIAGNOSIS — M546 Pain in thoracic spine: Secondary | ICD-10-CM | POA: Diagnosis not present

## 2023-05-19 DIAGNOSIS — M62830 Muscle spasm of back: Secondary | ICD-10-CM | POA: Diagnosis not present

## 2023-05-19 DIAGNOSIS — M9905 Segmental and somatic dysfunction of pelvic region: Secondary | ICD-10-CM | POA: Diagnosis not present

## 2023-05-19 DIAGNOSIS — M9902 Segmental and somatic dysfunction of thoracic region: Secondary | ICD-10-CM | POA: Diagnosis not present

## 2023-05-26 ENCOUNTER — Ambulatory Visit
Admission: RE | Admit: 2023-05-26 | Discharge: 2023-05-26 | Disposition: A | Discharge status: Home / Self Care | Payer: Medicare Other | Source: Ambulatory Visit | Attending: Urology | Admitting: Urology

## 2023-05-26 ENCOUNTER — Encounter: Payer: Self-pay | Admitting: Urology

## 2023-05-26 DIAGNOSIS — C3431 Malignant neoplasm of lower lobe, right bronchus or lung: Secondary | ICD-10-CM

## 2023-05-26 NOTE — Progress Notes (Signed)
 Telephone nursing appointment for review of most recent CT results. I verified patient's identity x2 and began nursing interview.   Patient reports total back pain 5/10, but is doing well. Patient denies any other related issues at this time.   Meaningful use complete.   Patient aware of their 11am 05/26/23 telephone appointment w/ Ashlyn Bruning PA-C. I left my extension [phone] in case patient needs anything. Patient verbalized understanding. This concludes the nursing interview.   Patient contact [phone]     Ruel Favors, LPN

## 2023-05-27 ENCOUNTER — Ambulatory Visit
Admission: RE | Admit: 2023-05-27 | Discharge: 2023-05-27 | Disposition: A | Discharge status: Home / Self Care | Source: Ambulatory Visit | Attending: Urology | Admitting: Urology

## 2023-05-27 DIAGNOSIS — F17210 Nicotine dependence, cigarettes, uncomplicated: Secondary | ICD-10-CM | POA: Diagnosis not present

## 2023-05-27 DIAGNOSIS — C3431 Malignant neoplasm of lower lobe, right bronchus or lung: Secondary | ICD-10-CM | POA: Diagnosis not present

## 2023-05-27 NOTE — Progress Notes (Signed)
 Radiation Oncology         [phone] ________________________________  Name: Carla Lane MRN: [id number]  Date: 05/27/2023  DOB: 04-07-1950  Post Treatment Note  CC: Renaye Rakers, MD  Renaye Rakers, MD  Diagnosis:   73 yo woman with a 3.8 cm non-small cell carcinoma of the right lower lobe of the lung - Stage IB (cT2a, cN0, cM0)     Interval Since Last Radiation:  1 year, 2 months  03/11/2022 through 03/23/2022 Site Technique Total Dose (Gy) Dose per Fx (Gy) Completed Fx Beam Energies  Lung, Right: Lung_R IMRT 60/60 12 5/5 6XFFF    Narrative:  I spoke with the patient to conduct her routine scheduled follow up visit via telephone to spare the patient unnecessary potential exposure in the healthcare setting during the current COVID-19 pandemic.  The patient was notified in advance and gave permission to proceed with this visit format.  She tolerated radiation therapy relatively well without any bothersome side effects.  She had a recent posttreatment CT chest scan on 05/18/23 which shows a good treatment response with decreased size of the treated right lower lobe pulmonary nodule with surrounding radiation treatment changes but no evidence of metastatic disease and stable appearance of scattered small groundglass and solid pulmonary nodules.    However, it also shows a new right lower lung mass separate from the treated nodule which is enlarging, now measuring 3.5 cm in addition to a new T8 compression fracture.  We reviewed these results today.                          On review of systems, the patient states she is doing well in general.  She did develop some dysphagia following her radiation but this has completely resolved at this point.  She does have some baseline indigestion but this has remained stable and is not interfering with her ability to eat and drink whatever she wants to.  She was hospitalized back in March 2024 with diverticulitis-this was her first episode and she has  now fully recovered.  She has a chronic, baseline cough with production of clear to whitish sputum throughout the day, unchanged recently.  She specifically denies increased cough, shortness of breath, chest pain or hemoptysis.  She has not had any recent fever or chills.  She had a recent follow-up visit with Dr. Tonia Brooms in the pulmonary office on 11/09/2022 and will see him again in 1 year for follow-up.  Overall, she is pleased with her progress to date.  ALLERGIES:  is allergic to codeine.  Meds: Current Outpatient Medications  Medication Sig Dispense Refill   albuterol (VENTOLIN HFA) 108 (90 Base) MCG/ACT inhaler Inhale 2 puffs into the lungs every 6 (six) hours as needed for wheezing or shortness of breath. 8 g 6   albuterol (VENTOLIN HFA) 108 (90 Base) MCG/ACT inhaler Inhale 2 puffs into the lungs every 4 (four) hours as needed for wheezing or shortness of breath. 18 g 0   aspirin EC 325 MG tablet Take 325 mg by mouth every 6 (six) hours as needed for moderate pain.     Iodine TINC Apply 1 Application topically 2 (two) times a week.     latanoprost (XALATAN) 0.005 % ophthalmic solution Place 1 drop into both eyes at bedtime.     Multiple Vitamins-Minerals (EMERGEN-C IMMUNE PLUS) PACK Take 1 packet by mouth daily.     nicotine (NICODERM CQ - DOSED IN MG/24  HOURS) 14 mg/24hr patch Place 14 mg onto the skin daily as needed (nicotine dependence).     Omega-3 Fatty Acids (SALMON OIL PO) Take 1 capsule by mouth 3 (three) times a week.     ondansetron (ZOFRAN-ODT) 8 MG disintegrating tablet Take 1 tablet (8 mg total) by mouth every 8 (eight) hours as needed for nausea or vomiting. 20 tablet 0   phenol (CHLORASEPTIC) 1.4 % LIQD Use as directed 2 sprays in the mouth or throat as needed for throat irritation / pain.     polyethylene glycol (MIRALAX) 17 g packet Take 17 g by mouth daily as needed. 28 each 0   sacubitril-valsartan (ENTRESTO) 49-51 MG Take 0.5 tablets by mouth daily.     Vitamin D,  Ergocalciferol, (DRISDOL) 1.25 MG (50000 UNIT) CAPS capsule Take 50,000 Units by mouth every Monday.     No current facility-administered medications for this encounter.    Physical Findings:  vitals were not taken for this visit.   /10 Unable to assess due to telephone follow up visit format.  Lab Findings: Lab Results  Component Value Date   WBC 7.1 05/16/2022   HGB 12.5 05/16/2022   HCT 38.3 05/16/2022   MCV 91.6 05/16/2022   PLT 232 05/16/2022     Radiographic Findings: CT Chest W Contrast Addendum Date: 05/26/2023 ADDENDUM REPORT: 05/26/2023 11:44 ADDENDUM: Additional impression. New moderate compression deformity of the T8 vertebral body with kyphosis and some canal encroachment. Etiology is uncertain. Please correlate with any known history of trauma or other process versus additional workup such as whole-body bone scan and potential thoracic spine MRI to assess for marrow placement process or metastatic disease in the setting of lung cancer. Electronically Signed   By: Karen Kays M.D.   On: 05/26/2023 11:44   Result Date: 05/26/2023 CLINICAL DATA:  Cistern response status post SBRT right lower lobe lung nodule January 2024. * Tracking Code: BO * EXAM: CT CHEST WITH CONTRAST TECHNIQUE: Multidetector CT imaging of the chest was performed during intravenous contrast administration. RADIATION DOSE REDUCTION: This exam was performed according to the departmental dose-optimization program which includes automated exposure control, adjustment of the mA and/or kV according to patient size and/or use of iterative reconstruction technique. CONTRAST:  75mL OMNIPAQUE IOHEXOL 300 MG/ML  SOLN COMPARISON:  Chest CT 11/13/2022 and older. FINDINGS: Cardiovascular: The thoracic aorta has a normal course and caliber with some partially calcified atherosclerotic plaque. Plaque also extends along the great vessels with potential stenosis at the origin of the left common carotid artery. Please correlate  with symptoms and dedicated CTA when clinically appropriate. Heart is nonenlarged. Small pericardial effusion. Mediastinum/Nodes: Small thyroid gland. Slightly patulous thoracic esophagus. No specific abnormal lymph node enlargement identified in the axillary regions, hilum or mediastinum. Only a few small less than 1 cm size nodes are seen, not pathologic by size criteria. Lungs/Pleura: Emphysematous changes are identified. The left lung there is a 3 mm nodule left upper lobe which is stable on series 3, image 82. There is basilar scar atelectasis is again seen and stable. No left-sided pleural effusion pneumothorax. The right lung once again has a rounded masslike area in the lower lobe. Previously this measured 2.1 x 1.3 cm abutting the pleura medially. Today when measured in same fashion on series 3, image 78 this measures 2.2 by 1.1 cm, not a change. Again there is spiculated extending to the hilum. More caudal to this is a nodular area which is larger measuring on series 3,  image 102 at 3.4 by 3.1 cm. On the prior study this area would measure 2.3 x 2.2 cm. Larger. There is a band of tissue extending between the two measured areas described. There is a smaller nodule lateral to this which previously was measured at 3 mm and today again 3 mm on series 3, image 100. Minimal ground-glass posterior right upper lobe is stable such as series 3, image 35. Upper Abdomen: Along the upper abdomen the adrenal glands are with bilateral nodules which are unchanged from previous examination. These are previously felt to be adenomas. Musculoskeletal: Osteopenia. Degenerative changes along the spine with some curvature. There is moderate compression of a midthoracic spine vertebral level which is new from August 2024, the T8 level. There is also some sclerosis. Some kyphosis. There is also some displacement of density along the left side towards the canal contributing to some canal and lateral recess stenosis. Recommend  further evaluation. IMPRESSION: Masslike nodular areas in the right lower lobe are again seen. The smaller more superior area abutting the pleura medially is similar to previous. The larger more caudal area in the right lower lobe is larger and worrisome for additional areas of disease. Recommend additional workup. PET-CT scan may be useful to further delineate etiology of these areas. Other small areas of nodularity including some ground-glass are similar to previous. No developing new abnormal lymph node enlargement. Findings will be called to the ordering service by the Radiology physician assistant team. Aortic Atherosclerosis (ICD10-I70.0) and Emphysema (ICD10-J43.9). Electronically Signed: By: Karen Kays M.D. On: 05/26/2023 11:35    Impression/Plan: 1. 73 yo woman with a 3.8 cm non-small cell carcinoma of the right lower lobe of the lung - Stage IB (cT2a, cN0, cM0).  She appears to have recovered well from the effects of her stereotactic body radiotherapy (SBRT) and is currently without complaints.    She had follow-up CT on 05/18/23.  She has a new enlarging right lower lung mass separate from the treated nodule which is enlarging and centrally necrotic, now measuring 3.5 cm in addition to a new T8 compression fracture.  Normally, I would get a PET-CT to assess the nodule and spine, but, the patient describes a severe reaction after her first PET-CT.  So, we will refer to Dr. Delton Coombes to assess the RLL nodule and get and T-spine MRI.  I personally spent 30 minutes in this encounter including chart review, reviewing radiological studies, telephone conversation with the patient, entering orders, coordinating care and completing documentation.   ------------------------------------------------   Margaretmary Dys, MD [facility]  Radiation Oncology Direct Dial: [phone]  Fax: [phone] [email or web address]  Skype  LinkedIn

## 2023-06-03 DIAGNOSIS — M9905 Segmental and somatic dysfunction of pelvic region: Secondary | ICD-10-CM | POA: Diagnosis not present

## 2023-06-03 DIAGNOSIS — M9902 Segmental and somatic dysfunction of thoracic region: Secondary | ICD-10-CM | POA: Diagnosis not present

## 2023-06-03 DIAGNOSIS — M9903 Segmental and somatic dysfunction of lumbar region: Secondary | ICD-10-CM | POA: Diagnosis not present

## 2023-06-03 DIAGNOSIS — M546 Pain in thoracic spine: Secondary | ICD-10-CM | POA: Diagnosis not present

## 2023-06-03 DIAGNOSIS — M62830 Muscle spasm of back: Secondary | ICD-10-CM | POA: Diagnosis not present

## 2023-06-04 DIAGNOSIS — F064 Anxiety disorder due to known physiological condition: Secondary | ICD-10-CM | POA: Diagnosis not present

## 2023-06-04 DIAGNOSIS — I10 Essential (primary) hypertension: Secondary | ICD-10-CM | POA: Diagnosis not present

## 2023-06-04 DIAGNOSIS — R102 Pelvic and perineal pain: Secondary | ICD-10-CM | POA: Diagnosis not present

## 2023-06-11 ENCOUNTER — Ambulatory Visit (HOSPITAL_COMMUNITY)
Admission: RE | Admit: 2023-06-11 | Discharge: 2023-06-11 | Disposition: A | Discharge status: Home / Self Care | Source: Ambulatory Visit | Attending: Radiation Oncology | Admitting: Radiation Oncology

## 2023-06-11 DIAGNOSIS — C3431 Malignant neoplasm of lower lobe, right bronchus or lung: Secondary | ICD-10-CM | POA: Diagnosis not present

## 2023-06-11 MED ORDER — GADOBUTROL 1 MMOL/ML IV SOLN
5.0000 mL | Freq: Once | INTRAVENOUS | Status: AC | PRN
  Administered 2023-06-11: 5 mL via INTRAVENOUS

## 2023-06-29 DIAGNOSIS — C3490 Malignant neoplasm of unspecified part of unspecified bronchus or lung: Secondary | ICD-10-CM | POA: Diagnosis not present

## 2023-06-29 DIAGNOSIS — I10 Essential (primary) hypertension: Secondary | ICD-10-CM | POA: Diagnosis not present

## 2023-06-29 DIAGNOSIS — Z716 Tobacco abuse counseling: Secondary | ICD-10-CM | POA: Diagnosis not present

## 2023-06-29 DIAGNOSIS — R634 Abnormal weight loss: Secondary | ICD-10-CM | POA: Diagnosis not present

## 2023-06-29 DIAGNOSIS — F331 Major depressive disorder, recurrent, moderate: Secondary | ICD-10-CM | POA: Diagnosis not present

## 2023-07-08 ENCOUNTER — Encounter: Payer: Self-pay | Admitting: Emergency Medicine

## 2023-07-08 ENCOUNTER — Other Ambulatory Visit: Payer: Self-pay | Admitting: Radiation Therapy

## 2023-07-08 ENCOUNTER — Ambulatory Visit: Admitting: Emergency Medicine

## 2023-07-08 VITALS — BP 152/84 | HR 82 | Ht 63.0 in | Wt 115.2 lb

## 2023-07-08 DIAGNOSIS — C3431 Malignant neoplasm of lower lobe, right bronchus or lung: Secondary | ICD-10-CM

## 2023-07-08 DIAGNOSIS — F17210 Nicotine dependence, cigarettes, uncomplicated: Secondary | ICD-10-CM

## 2023-07-08 NOTE — Assessment & Plan Note (Signed)
 Treated with SBRT but now with an adjacent rounded mass that is consistent with either spread or new primary.  She was referred by Dr. Lorri Rota for tissue diagnosis which I think she likely needs especially since she did not tolerate PET scan.  She questions whether she may be able to undergo SBRT without a bronchoscopy.  I briefly messaged with Dr. Lorri Rota and he feels that empiric SBRT may be an option.  He is going to discuss this with her when they follow-up regarding her spinal compression fracture and its evaluation for possible metastatic disease.  If she does need bronchoscopy then I will be happy to arrange.  The right lower lobe mass should be accessible to navigational bronchoscopy.

## 2023-07-08 NOTE — Patient Instructions (Addendum)
 We reviewed your CT scans of the chest today.  There is an enlarging mass at the bottom of the right lung adjacent to your previously treated area. Recommend that we pursue bronchoscopy to evaluate this area and to determine next best steps in treatment.  We will review with Dr. Lorri Rota to ensure that he agrees, would not want to treat with radiation upfront.  We will then contact you to discuss which plan is best to pursue. Work on decreasing your cigarettes if possible Follow with Dr. Baldwin Levee in 1 month or next available

## 2023-07-08 NOTE — Progress Notes (Signed)
 Subjective:    Patient ID: Carla Lane, female    DOB: 01-13-1951, 73 y.o.   MRN: [id number]  HPI 73 year old active smoker (60 pack years) with a history of hypertension, GERD, restless leg syndrome, stage Ib right lower lobe non-small cell lung cancer treated by SBRT completed 03/23/2022.  She has been seen in our office by Dr. Thelda Finney.  She is an area of evolving change adjacent but distinct from her treated right lower lobe lesion on CT as below. She is not very active, but denies any dyspnea. She has some morning cough, some nasal congestion and drainage. No BD's currently. She is smoking about 2/3 pk/day.  We have discussed bronchoscopy to characterize the RLL changes, she is hesitant, wants to talk to Dr Lorri Rota about whether he would be willing to do XRT without a tissue dx.     CT chest 05/18/2023 reviewed by me showed emphysema, 3 mm stable left upper lobe pulmonary nodule, stable 2.2 x 1.1 cm right lower lobe rounded nodular opacity adjacent to the medial pleura, enlarging rounded adjacent but distinct right lower lobe mass 3.4 x 3.1 cm.  No mediastinal adenopathy.  Note was also made of a new T8 compression deformity  MRI T-spine 06/11/2023 showed compression fracture at T7 resulting in height loss and enhancement as well as some inferior enhancement at the endplate of T6 and superior endplate of T8.  Possibly related to the compression fracture versus metastatic disease.   Review of Systems As per HPI  Past Medical History:  Diagnosis Date   Anemia    during menstruating years   Anxiety    Arthritis    Cancer (HCC)    squamous (tip of nose) Basal - top of shoulder, top of each ears and top of forehead   Cataracts, bilateral 05/2017   Colon polyps    Depression    Dyspnea 11/2021   GERD (gastroesophageal reflux disease)    Hypertension    does not routinely take her blood pressure medications every day.   Pneumonia    Primary open angle glaucoma 10/2015   Bilateral    Rapid heart rate    during pre-menopause - thinks it was due to a HTN medication   Restless leg syndrome 07/16/2020   Right lower lobe lung mass 09/04/2021     No family history on file.   Social History   Socioeconomic History   Marital status: Married    Spouse name: Not on file   Number of children: Not on file   Years of education: Not on file   Highest education level: Not on file  Occupational History   Not on file  Tobacco Use   Smoking status: Every Day    Current packs/day: 1.00    Average packs/day: 1 pack/day for 60.0 years (60.0 ttl pk-yrs)    Types: Cigarettes   Smokeless tobacco: Never   Tobacco comments:    1/2 pk daily 07/08/23  Vaping Use   Vaping status: Former   Devices: Did a trial study for vapes  Substance and Sexual Activity   Alcohol use: Yes    Comment: occasional   Drug use: Never   Sexual activity: Not Currently    Birth control/protection: Post-menopausal  Other Topics Concern   Not on file  Social History Narrative   Not on file   Social Drivers of Health   Financial Resource Strain: Not on file  Food Insecurity: No Food Insecurity (05/26/2023)   Hunger Vital Sign  Worried About Programme researcher, broadcasting/film/video in the Last Year: Never true    Ran Out of Food in the Last Year: Never true  Transportation Needs: No Transportation Needs (05/26/2023)   PRAPARE - Administrator, Civil Service (Medical): No    Lack of Transportation (Non-Medical): No  Physical Activity: Not on file  Stress: Not on file  Social Connections: Not on file  Intimate Partner Violence: Not At Risk (05/26/2023)   Humiliation, Afraid, Rape, and Kick questionnaire    Fear of Current or Ex-Partner: No    Emotionally Abused: No    Physically Abused: No    Sexually Abused: No     Allergies  Allergen Reactions   Codeine Other (See Comments)    White spots on throat, Looks like strep throat (allergic reaction)     Outpatient Medications Prior to Visit   Medication Sig Dispense Refill   aspirin EC 325 MG tablet Take 325 mg by mouth every 6 (six) hours as needed for moderate pain.     latanoprost  (XALATAN ) 0.005 % ophthalmic solution Place 1 drop into both eyes at bedtime.     Multiple Vitamins-Minerals (EMERGEN-C IMMUNE PLUS) PACK Take 1 packet by mouth daily.     nicotine  (NICODERM CQ  - DOSED IN MG/24 HOURS) 14 mg/24hr patch Place 14 mg onto the skin daily as needed (nicotine  dependence).     Omega-3 Fatty Acids (SALMON OIL PO) Take 1 capsule by mouth 3 (three) times a week.     phenol (CHLORASEPTIC) 1.4 % LIQD Use as directed 2 sprays in the mouth or throat as needed for throat irritation / pain.     sacubitril-valsartan (ENTRESTO) 49-51 MG Take 0.5 tablets by mouth daily.     Vitamin D, Ergocalciferol, (DRISDOL) 1.25 MG (50000 UNIT) CAPS capsule Take 50,000 Units by mouth every Monday.     albuterol  (VENTOLIN  HFA) 108 (90 Base) MCG/ACT inhaler Inhale 2 puffs into the lungs every 6 (six) hours as needed for wheezing or shortness of breath. (Patient not taking: Reported on 07/08/2023) 8 g 6   albuterol  (VENTOLIN  HFA) 108 (90 Base) MCG/ACT inhaler Inhale 2 puffs into the lungs every 4 (four) hours as needed for wheezing or shortness of breath. (Patient not taking: Reported on 07/08/2023) 18 g 0   amoxicillin -clavulanate (AUGMENTIN ) 875-125 MG tablet Take 1 tablet by mouth 2 (two) times daily. (Patient not taking: Reported on 07/08/2023)     Iodine TINC Apply 1 Application topically 2 (two) times a week. (Patient not taking: Reported on 07/08/2023)     ondansetron  (ZOFRAN -ODT) 8 MG disintegrating tablet Take 1 tablet (8 mg total) by mouth every 8 (eight) hours as needed for nausea or vomiting. (Patient not taking: Reported on 07/08/2023) 20 tablet 0   polyethylene glycol (MIRALAX ) 17 g packet Take 17 g by mouth daily as needed. (Patient not taking: Reported on 07/08/2023) 28 each 0   No facility-administered medications prior to visit.         Objective:   Physical Exam  Vitals:   07/08/23 0931 07/08/23 0932  BP: (!) 189/85 (!) 152/84  Pulse: 82   SpO2: 97%   Weight: 115 lb 3.2 oz (52.3 kg)   Height: 5\' 3"  (1.6 m)    Gen: Pleasant, well-nourished, in no distress,  normal affect  ENT: No lesions,  mouth clear,  oropharynx clear, no postnasal drip  Neck: No JVD, no stridor  Lungs: No use of accessory muscles, no crackles or wheezing on normal  respiration, no wheeze on forced expiration  Cardiovascular: RRR, heart sounds normal, no murmur or gallops, no peripheral edema  Musculoskeletal: No deformities, no cyanosis or clubbing  Neuro: alert, awake, non focal  Skin: Warm, no lesions or rash     Assessment & Plan:  Primary cancer of right lower lobe of lung (HCC) Treated with SBRT but now with an adjacent rounded mass that is consistent with either spread or new primary.  She was referred by Dr. Lorri Rota for tissue diagnosis which I think she likely needs especially since she did not tolerate PET scan.  She questions whether she may be able to undergo SBRT without a bronchoscopy.  I briefly messaged with Dr. Lorri Rota and he feels that empiric SBRT may be an option.  He is going to discuss this with her when they follow-up regarding her spinal compression fracture and its evaluation for possible metastatic disease.  If she does need bronchoscopy then I will be happy to arrange.  The right lower lobe mass should be accessible to navigational bronchoscopy.    Racheal Buddle, MD, PhD 07/08/2023, 1:27 PM [facility] [phone] or if no answer before 7:00PM call [phone] For any issues after 7:00PM please call eLink [phone]

## 2023-07-12 ENCOUNTER — Inpatient Hospital Stay: Attending: Radiation Oncology

## 2023-07-13 NOTE — Progress Notes (Signed)
 Telephone nursing appointment for review of most recent CT-Chest & MRI-Spine results. I verified patient's identity x2 and began nursing interview.    Patient states issues as follows... -Pain: 5/10 lower back -Fatigue: Mild -Chest: Cough -Lungs: Denies -Cardiac: Denies -Skin: Small discolored bumps all over body -Appetite: Good  -Spinal issues: Healing from compression fracture at T7.   Patient denies any other related issues at this time.   Meaningful use complete.   Patient aware of their telephone appointment w/ Ashlyn Bruning PA-C. I left my extension [phone] in case patient needs anything. Patient verbalized understanding. This concludes the nursing interview.   Patient preferred phone # [phone]    Avery Bodo, LPN

## 2023-07-15 ENCOUNTER — Other Ambulatory Visit: Payer: Self-pay | Admitting: Radiation Therapy

## 2023-07-15 ENCOUNTER — Telehealth: Payer: Self-pay

## 2023-07-15 ENCOUNTER — Ambulatory Visit
Admission: RE | Admit: 2023-07-15 | Discharge: 2023-07-15 | Disposition: A | Discharge status: Home / Self Care | Source: Ambulatory Visit | Attending: Urology | Admitting: Urology

## 2023-07-15 ENCOUNTER — Encounter: Payer: Self-pay | Admitting: Urology

## 2023-07-15 VITALS — Ht 63.0 in | Wt 115.0 lb

## 2023-07-15 DIAGNOSIS — C3431 Malignant neoplasm of lower lobe, right bronchus or lung: Secondary | ICD-10-CM | POA: Diagnosis not present

## 2023-07-15 DIAGNOSIS — M4854XA Collapsed vertebra, not elsewhere classified, thoracic region, initial encounter for fracture: Secondary | ICD-10-CM | POA: Insufficient documentation

## 2023-07-15 DIAGNOSIS — F17210 Nicotine dependence, cigarettes, uncomplicated: Secondary | ICD-10-CM | POA: Diagnosis not present

## 2023-07-15 DIAGNOSIS — S22000A Wedge compression fracture of unspecified thoracic vertebra, initial encounter for closed fracture: Secondary | ICD-10-CM

## 2023-07-15 NOTE — Telephone Encounter (Signed)
 Error

## 2023-07-15 NOTE — Progress Notes (Signed)
 Radiation Oncology         [phone] ________________________________  Name: Carla Lane MRN: [id number]  Date: 07/15/2023  DOB: 09/05/1950  Post Treatment Note  CC: Jonathon Neighbors, MD  Jonathon Neighbors, MD  Diagnosis:   73 yo woman with a 3.8 cm non-small cell carcinoma of the right lower lobe of the lung - Stage IB (cT2a, cN0, cM0)     Interval Since Last Radiation:  1 year, 2 months  03/11/2022 through 03/23/2022 Site Technique Total Dose (Gy) Dose per Fx (Gy) Completed Fx Beam Energies  Lung, Right: Lung_R IMRT 60/60 12 5/5 6XFFF    Narrative:  I spoke with the patient to conduct her routine scheduled follow up visit via telephone to spare the patient unnecessary potential exposure in the healthcare setting during the current COVID-19 pandemic.  The patient was notified in advance and gave permission to proceed with this visit format.  She tolerated radiation therapy relatively well without any bothersome side effects.  She had a recent posttreatment CT chest scan on 05/18/23 which shows a good treatment response with decreased size of the treated right lower lobe pulmonary nodule with surrounding radiation treatment changes but no evidence of metastatic disease and stable appearance of scattered small groundglass and solid pulmonary nodules.    However, it also shows a new right lower lung mass separate from the treated nodule which is enlarging, now measuring 3.5 cm in addition to a new T8 compression fracture. We reviewed those results by telephone on 05/27/23 and recommended further evaluation of the new T8 compression fracture with MRI T-spine to r/o pathologic compression fracture that might indicate a Stage IV lung cancer as opposed to a new or recurrent early stage lung cancer. We also discussed getting pulmonology to weigh in regarding biopsy of the enlarging lung nodule for tissue confirmation. She met with Dr. Baldwin Levee on 07/08/23 and he felt confident that the lesion is reachable via  navigational bronchoscopy for biopsy but the patient was very hesitant to proceed and instead preferred to consider empiric SBRT without tissue confirmation.  The MRI T-spine was performed on 06/11/23 but unfortunately was not resulted until 07/07/23. Fortunately, there was no evidence of cord compression and it was unclear as to whether the enhancement at T6-T8 was secondary to underlying metastasis or could be reactive to recent compression fracture at T7. The recommendation is for repeat imaging in 1-2 months to assess for any change. We reviewed these results and recommendations today.                          On review of systems, the patient states she is doing well in general.  She did develop some dysphagia following her previous radiation but this has completely resolved at this point.  She does have some baseline indigestion but this has remained stable and is not interfering with her ability to eat and drink whatever she wants to.  She was hospitalized back in March 2024 with diverticulitis-this was her first episode and she has now fully recovered.  She has a chronic, baseline cough with production of clear to whitish sputum throughout the day, unchanged recently.  She specifically denies increased cough, shortness of breath, chest pain or hemoptysis.  She has not had any recent fever or chills.  She does have some mid-back pain with activities such as bending and twisting but improves with rest and does not wake her from sleep at night. This has  been ongoing for several months and seems to be gradually improving with daily stretching, use of heating pad and tylenol /advil prn. She denies any radiation of pain or paraesthesias in the upper or lower extremities, She has not noted any focal weakness in the upper or lower extremities and no changes in bowel or bladder control. Overall, she is pleased with her progress to date.  ALLERGIES:  is allergic to codeine.  Meds: Current Outpatient Medications   Medication Sig Dispense Refill   aspirin EC 325 MG tablet Take 325 mg by mouth every 6 (six) hours as needed for moderate pain.     latanoprost  (XALATAN ) 0.005 % ophthalmic solution Place 1 drop into both eyes at bedtime.     Multiple Vitamins-Minerals (EMERGEN-C IMMUNE PLUS) PACK Take 1 packet by mouth daily.     nicotine  (NICODERM CQ  - DOSED IN MG/24 HOURS) 14 mg/24hr patch Place 14 mg onto the skin daily as needed (nicotine  dependence).     Omega-3 Fatty Acids (SALMON OIL PO) Take 1 capsule by mouth 3 (three) times a week.     phenol (CHLORASEPTIC) 1.4 % LIQD Use as directed 2 sprays in the mouth or throat as needed for throat irritation / pain.     sacubitril-valsartan (ENTRESTO) 49-51 MG Take 0.5 tablets by mouth daily.     Vitamin D, Ergocalciferol, (DRISDOL) 1.25 MG (50000 UNIT) CAPS capsule Take 50,000 Units by mouth every Monday.     albuterol  (VENTOLIN  HFA) 108 (90 Base) MCG/ACT inhaler Inhale 2 puffs into the lungs every 6 (six) hours as needed for wheezing or shortness of breath. (Patient not taking: Reported on 07/08/2023) 8 g 6   albuterol  (VENTOLIN  HFA) 108 (90 Base) MCG/ACT inhaler Inhale 2 puffs into the lungs every 4 (four) hours as needed for wheezing or shortness of breath. (Patient not taking: Reported on 07/15/2023) 18 g 0   amoxicillin -clavulanate (AUGMENTIN ) 875-125 MG tablet Take 1 tablet by mouth 2 (two) times daily. (Patient not taking: Reported on 07/15/2023)     Iodine TINC Apply 1 Application topically 2 (two) times a week. (Patient not taking: Reported on 07/08/2023)     ondansetron  (ZOFRAN -ODT) 8 MG disintegrating tablet Take 1 tablet (8 mg total) by mouth every 8 (eight) hours as needed for nausea or vomiting. (Patient not taking: Reported on 07/15/2023) 20 tablet 0   polyethylene glycol (MIRALAX ) 17 g packet Take 17 g by mouth daily as needed. (Patient not taking: Reported on 07/15/2023) 28 each 0   No current facility-administered medications for this encounter.     Physical Findings:  height is 5\' 3"  (1.6 m) and weight is 115 lb (52.2 kg).  Pain Assessment Pain Score: 5  Pain Loc: Back (Lower back)/10 Unable to assess due to telephone follow up visit format.  Lab Findings: Lab Results  Component Value Date   WBC 7.1 05/16/2022   HGB 12.5 05/16/2022   HCT 38.3 05/16/2022   MCV 91.6 05/16/2022   PLT 232 05/16/2022     Radiographic Findings: No results found.   Impression/Plan: 1. 73 yo woman with an enlarging, 3.5 cm right lower lobe nodule, putative Stage IB NSCLC and a history of a 3.8 cm non-small cell carcinoma of the right lower lobe of the lung - Stage IB (cT2a, cN0, cM0).  She appears to have recovered well from the effects of her stereotactic body radiotherapy (SBRT) and is currently without complaints.    She had follow-up CT on 05/18/23.  She has a new enlarging  right lower lung mass separate from the treated nodule which is enlarging and centrally necrotic, now measuring 3.5 cm in addition to a new T8 compression fracture.  Normally, we would get a PET-CT to assess the nodule and spine, but, the patient describes a severe reaction after her first PET-CT.  So, she had a T-spine MRI on 06/11/23 that did not definitively confirm any underlying malignancy so we are planning to repeat MRI T-spine in 2 months to assess for any change. She feels like her pain is improving at this point but we did discuss the potential diagnostic and therapeutic role of kyphoplasty with biopsy if her pain increases or or does not continue to improve. She would like to hold off on this for now and will let us  know if her pain progresses. In regards to the enlarging RLL lung nodule, she prefers to forego bronchoscopy for tissue biopsy and instead would like to proceed with a 5 fraction course of SBRT that Dr. Lorri Rota has offered, to empirically treat the putative stage IB NSCLC.  She is tentatively scheduled for CT SIM at 1:30pm on Tuesday 07/20/23, in anticipation  of beginning her treatments in the near future. We will also plan to repeat the MRI T-spine in 09/2023 and we will review these in the multidisciplinary conference and I will call her with the results and recommendations thereafter.  I personally spent 30 minutes in this encounter including chart review, reviewing radiological studies, telephone conversation with the patient, entering orders, coordinating care and completing documentation.   Arta Bihari, MMS, PA-C [place]  [facility] Radiation Oncology Physician Assistant Direct Dial: [phone]  Fax: [phone]

## 2023-07-20 DIAGNOSIS — L570 Actinic keratosis: Secondary | ICD-10-CM | POA: Diagnosis not present

## 2023-07-20 DIAGNOSIS — D1801 Hemangioma of skin and subcutaneous tissue: Secondary | ICD-10-CM | POA: Diagnosis not present

## 2023-07-20 DIAGNOSIS — D2261 Melanocytic nevi of right upper limb, including shoulder: Secondary | ICD-10-CM | POA: Diagnosis not present

## 2023-07-20 DIAGNOSIS — L821 Other seborrheic keratosis: Secondary | ICD-10-CM | POA: Diagnosis not present

## 2023-07-20 DIAGNOSIS — Z85828 Personal history of other malignant neoplasm of skin: Secondary | ICD-10-CM | POA: Diagnosis not present

## 2023-07-20 DIAGNOSIS — L853 Xerosis cutis: Secondary | ICD-10-CM | POA: Diagnosis not present

## 2023-07-20 DIAGNOSIS — L820 Inflamed seborrheic keratosis: Secondary | ICD-10-CM | POA: Diagnosis not present

## 2023-07-20 DIAGNOSIS — D2271 Melanocytic nevi of right lower limb, including hip: Secondary | ICD-10-CM | POA: Diagnosis not present

## 2023-07-20 DIAGNOSIS — D225 Melanocytic nevi of trunk: Secondary | ICD-10-CM | POA: Diagnosis not present

## 2023-07-21 NOTE — Progress Notes (Signed)
  Radiation Oncology         [phone] ________________________________  Name: Carla Lane MRN: [id number]  Date: 07/22/2023  DOB: [birth year]  STEREOTACTIC BODY RADIOTHERAPY SIMULATION AND TREATMENT PLANNING NOTE    ICD-10-CM   1. Primary cancer of right lower lobe of lung (HCC)  C34.31       DIAGNOSIS:  73 yo woman with a 3.8 cm non-small cell carcinoma of the right lower lobe of the lung - Stage IB (cT2a, cN0, cM0)     NARRATIVE:  The patient was brought to the CT Simulation planning suite.  Identity was confirmed.  All relevant records and images related to the planned course of therapy were reviewed.  The patient freely provided informed written consent to proceed with treatment after reviewing the details related to the planned course of therapy. The consent form was witnessed and verified by the simulation staff.  Then, the patient was set-up in a stable reproducible  supine position for radiation therapy.  A BodyFix immobilization pillow was fabricated for reproducible positioning.  Then I personally applied the abdominal compression paddle to limit respiratory excursion.  4D respiratoy motion management CT images were obtained.  Surface markings were placed.  The CT images were loaded into the planning software.  Then, using Cine, MIP, and standard views, the internal target volume (ITV) and planning target volumes (PTV) were delinieated, and avoidance structures were contoured.  Treatment planning then occurred.  The radiation prescription was entered and confirmed.  A total of two complex treatment devices were fabricated in the form of the BodyFix immobilization pillow and a neck accuform cushion.  I have requested : 3D Simulation  I have requested a DVH of the following structures: Heart, Lungs, Esophagus, Chest Wall, Brachial Plexus, Major Blood Vessels, and targets.  SPECIAL TREATMENT PROCEDURE:  The planned course of therapy using radiation constitutes a special treatment  procedure. Special care is required in the management of this patient for the following reasons. This treatment constitutes a Special Treatment Procedure for the following reason: [ High dose per fraction requiring special monitoring for increased toxicities of treatment including daily imaging..  The special nature of the planned course of radiotherapy will require increased physician supervision and oversight to ensure patient's safety with optimal treatment outcomes.  This requires extended time and effort.    RESPIRATORY MOTION MANAGEMENT SIMULATION:  In order to account for effect of respiratory motion on target structures and other organs in the planning and delivery of radiotherapy, this patient underwent respiratory motion management simulation.  To accomplish this, when the patient was brought to the CT simulation planning suite, 4D respiratoy motion management CT images were obtained.  The CT images were loaded into the planning software.  Then, using a variety of tools including Cine, MIP, and standard views, the target volume and planning target volumes (PTV) were delineated.  Avoidance structures were contoured.  Treatment planning then occurred.  Dose volume histograms were generated and reviewed for each of the requested structure.  The resulting plan was carefully reviewed and approved today.  PLAN:  The patient will receive 60 Gy in 5 fractions.  ________________________________  Trilby Fujisawa Lorri Rota, M.D.

## 2023-07-22 ENCOUNTER — Ambulatory Visit
Admission: RE | Admit: 2023-07-22 | Discharge: 2023-07-22 | Disposition: A | Discharge status: Home / Self Care | Source: Ambulatory Visit | Attending: Radiation Oncology | Admitting: Radiation Oncology

## 2023-07-22 DIAGNOSIS — Z510 Encounter for antineoplastic radiation therapy: Secondary | ICD-10-CM | POA: Diagnosis not present

## 2023-07-22 DIAGNOSIS — F17210 Nicotine dependence, cigarettes, uncomplicated: Secondary | ICD-10-CM | POA: Diagnosis not present

## 2023-07-22 DIAGNOSIS — C3431 Malignant neoplasm of lower lobe, right bronchus or lung: Secondary | ICD-10-CM | POA: Insufficient documentation

## 2023-07-27 DIAGNOSIS — F419 Anxiety disorder, unspecified: Secondary | ICD-10-CM | POA: Diagnosis not present

## 2023-07-27 DIAGNOSIS — C3490 Malignant neoplasm of unspecified part of unspecified bronchus or lung: Secondary | ICD-10-CM | POA: Diagnosis not present

## 2023-07-27 DIAGNOSIS — J309 Allergic rhinitis, unspecified: Secondary | ICD-10-CM | POA: Diagnosis not present

## 2023-07-27 DIAGNOSIS — I10 Essential (primary) hypertension: Secondary | ICD-10-CM | POA: Diagnosis not present

## 2023-07-30 ENCOUNTER — Telehealth: Payer: Self-pay | Admitting: Radiation Therapy

## 2023-07-30 NOTE — Telephone Encounter (Signed)
 Called and left a detailed message on pt's cell about her upcoming thoracic spine MRI in July. My contact information was included for her to call back with any questions or conflicts.   Axel Bohr R.T.(R)(T) Radiation Special Procedures Lead

## 2023-08-02 ENCOUNTER — Other Ambulatory Visit: Payer: Self-pay

## 2023-08-02 ENCOUNTER — Ambulatory Visit
Admission: RE | Admit: 2023-08-02 | Discharge: 2023-08-02 | Disposition: A | Discharge status: Home / Self Care | Source: Ambulatory Visit | Attending: Radiation Oncology | Admitting: Radiation Oncology

## 2023-08-02 DIAGNOSIS — C3431 Malignant neoplasm of lower lobe, right bronchus or lung: Secondary | ICD-10-CM | POA: Diagnosis not present

## 2023-08-02 DIAGNOSIS — Z510 Encounter for antineoplastic radiation therapy: Secondary | ICD-10-CM | POA: Diagnosis not present

## 2023-08-02 DIAGNOSIS — F17210 Nicotine dependence, cigarettes, uncomplicated: Secondary | ICD-10-CM | POA: Diagnosis not present

## 2023-08-02 LAB — RAD ONC ARIA SESSION SUMMARY
Course Elapsed Days: 0
Plan Fractions Treated to Date: 1
Plan Prescribed Dose Per Fraction: 10 Gy
Plan Total Fractions Prescribed: 5
Plan Total Prescribed Dose: 50 Gy
Reference Point Dosage Given to Date: 10 Gy
Reference Point Session Dosage Given: 10 Gy
Session Number: 1

## 2023-08-03 ENCOUNTER — Ambulatory Visit

## 2023-08-04 ENCOUNTER — Other Ambulatory Visit: Payer: Self-pay

## 2023-08-04 ENCOUNTER — Ambulatory Visit
Admission: RE | Admit: 2023-08-04 | Discharge: 2023-08-04 | Disposition: A | Discharge status: Home / Self Care | Source: Ambulatory Visit | Attending: Radiation Oncology | Admitting: Radiation Oncology

## 2023-08-04 DIAGNOSIS — C3431 Malignant neoplasm of lower lobe, right bronchus or lung: Secondary | ICD-10-CM

## 2023-08-04 DIAGNOSIS — F17210 Nicotine dependence, cigarettes, uncomplicated: Secondary | ICD-10-CM | POA: Diagnosis not present

## 2023-08-04 DIAGNOSIS — Z510 Encounter for antineoplastic radiation therapy: Secondary | ICD-10-CM | POA: Diagnosis not present

## 2023-08-04 LAB — RAD ONC ARIA SESSION SUMMARY
Course Elapsed Days: 2
Plan Fractions Treated to Date: 2
Plan Prescribed Dose Per Fraction: 10 Gy
Plan Total Fractions Prescribed: 5
Plan Total Prescribed Dose: 50 Gy
Reference Point Dosage Given to Date: 20 Gy
Reference Point Session Dosage Given: 10 Gy
Session Number: 2

## 2023-08-05 ENCOUNTER — Ambulatory Visit

## 2023-08-06 ENCOUNTER — Other Ambulatory Visit: Payer: Self-pay

## 2023-08-06 ENCOUNTER — Ambulatory Visit
Admission: RE | Admit: 2023-08-06 | Discharge: 2023-08-06 | Disposition: A | Discharge status: Home / Self Care | Source: Ambulatory Visit | Attending: Radiation Oncology | Admitting: Radiation Oncology

## 2023-08-06 DIAGNOSIS — Z510 Encounter for antineoplastic radiation therapy: Secondary | ICD-10-CM | POA: Diagnosis not present

## 2023-08-06 DIAGNOSIS — F17210 Nicotine dependence, cigarettes, uncomplicated: Secondary | ICD-10-CM | POA: Diagnosis not present

## 2023-08-06 DIAGNOSIS — C3431 Malignant neoplasm of lower lobe, right bronchus or lung: Secondary | ICD-10-CM

## 2023-08-06 LAB — RAD ONC ARIA SESSION SUMMARY
Course Elapsed Days: 4
Plan Fractions Treated to Date: 3
Plan Prescribed Dose Per Fraction: 10 Gy
Plan Total Fractions Prescribed: 5
Plan Total Prescribed Dose: 50 Gy
Reference Point Dosage Given to Date: 30 Gy
Reference Point Session Dosage Given: 10 Gy
Session Number: 3

## 2023-08-10 ENCOUNTER — Ambulatory Visit
Admission: RE | Admit: 2023-08-10 | Discharge: 2023-08-10 | Disposition: A | Discharge status: Home / Self Care | Source: Ambulatory Visit | Attending: Radiation Oncology | Admitting: Radiation Oncology

## 2023-08-10 ENCOUNTER — Other Ambulatory Visit: Payer: Self-pay

## 2023-08-10 DIAGNOSIS — F17210 Nicotine dependence, cigarettes, uncomplicated: Secondary | ICD-10-CM | POA: Diagnosis not present

## 2023-08-10 DIAGNOSIS — C3431 Malignant neoplasm of lower lobe, right bronchus or lung: Secondary | ICD-10-CM | POA: Diagnosis not present

## 2023-08-10 DIAGNOSIS — Z510 Encounter for antineoplastic radiation therapy: Secondary | ICD-10-CM | POA: Diagnosis not present

## 2023-08-10 LAB — RAD ONC ARIA SESSION SUMMARY
Course Elapsed Days: 8
Plan Fractions Treated to Date: 4
Plan Prescribed Dose Per Fraction: 10 Gy
Plan Total Fractions Prescribed: 5
Plan Total Prescribed Dose: 50 Gy
Reference Point Dosage Given to Date: 40 Gy
Reference Point Session Dosage Given: 10 Gy
Session Number: 4

## 2023-08-12 ENCOUNTER — Ambulatory Visit
Admission: RE | Admit: 2023-08-12 | Discharge: 2023-08-12 | Disposition: A | Discharge status: Home / Self Care | Source: Ambulatory Visit | Attending: Radiation Oncology | Admitting: Radiation Oncology

## 2023-08-12 ENCOUNTER — Other Ambulatory Visit: Payer: Self-pay

## 2023-08-12 ENCOUNTER — Encounter: Payer: Self-pay | Admitting: Urology

## 2023-08-12 DIAGNOSIS — C3431 Malignant neoplasm of lower lobe, right bronchus or lung: Secondary | ICD-10-CM

## 2023-08-12 DIAGNOSIS — Z510 Encounter for antineoplastic radiation therapy: Secondary | ICD-10-CM | POA: Diagnosis not present

## 2023-08-12 DIAGNOSIS — F17210 Nicotine dependence, cigarettes, uncomplicated: Secondary | ICD-10-CM | POA: Diagnosis not present

## 2023-08-12 DIAGNOSIS — S22000A Wedge compression fracture of unspecified thoracic vertebra, initial encounter for closed fracture: Secondary | ICD-10-CM

## 2023-08-12 LAB — RAD ONC ARIA SESSION SUMMARY
Course Elapsed Days: 10
Plan Fractions Treated to Date: 5
Plan Prescribed Dose Per Fraction: 10 Gy
Plan Total Fractions Prescribed: 5
Plan Total Prescribed Dose: 50 Gy
Reference Point Dosage Given to Date: 50 Gy
Reference Point Session Dosage Given: 10 Gy
Session Number: 5

## 2023-08-12 NOTE — Progress Notes (Signed)
 Patient completed SBRT 08/12/23 and will need post-treatment CT Chest at [facility] in 09/2023. Patient lives in [place] and prefers to have follow up imaging at [facility].  Arta Bihari, MMS, PA-C [place]  [facility] Radiation Oncology Physician Assistant Direct Dial: [phone]  Fax: [phone]

## 2023-08-13 NOTE — Radiation Completion Notes (Addendum)
  Radiation Oncology         [phone] ________________________________  Name: Carla Lane MRN: [id number]  Date: 08/12/2023  DOB: July 01, 1950  Referring Physician: Jonathon Neighbors, M.D. Date of Service: 2023-08-13 Radiation Oncologist: Bartholome Ligas, M.D. [facility]     RADIATION ONCOLOGY END OF TREATMENT NOTE     Diagnosis: 73 yo woman with an enlarging, 3.5 cm, non-small cell carcinoma of the right lower lobe of the lung - Stage IB (cT2a, cN0, cM0), separate from the previously treated RLL nodule    Intent: Curative     ==========DELIVERED PLANS==========  First Treatment Date: 2023-08-02 Last Treatment Date: 2023-08-12   Plan Name: Lung_RLL_SBRT Site: Lung, Right Technique: SBRT/SRT-IMRT Mode: Photon Dose Per Fraction: 10 Gy Prescribed Dose (Delivered / Prescribed): 50 Gy / 50 Gy Prescribed Fxs (Delivered / Prescribed): 5 / 5     ==========ON TREATMENT VISIT DATES========== 2023-08-02, 2023-08-04, 2023-08-06, 2023-08-10, 2023-08-12, 2023-08-12     See weekly On Treatment Notes in Epic for details in the Media tab (listed as Progress notes on the On Treatment Visit Dates listed above).   The patient will receive a call in about one month from the radiation oncology department.  We will coordinate for a posttreatment CT chest scan in July 2025 and I will plan to contact her by telephone thereafter to review the results and recommendations.  She is also due for a repeat MRI thoracic spine so we will try to coordinate the imaging studies to minimize trips to radiology.  ------------------------------------------------   Kenith Payer, MD [facility]  Radiation Oncology Direct Dial: [phone]  Fax: [phone] [email or web address]  Skype  LinkedIn

## 2023-09-07 ENCOUNTER — Ambulatory Visit
Admission: RE | Admit: 2023-09-07 | Discharge: 2023-09-07 | Disposition: A | Discharge status: Home / Self Care | Source: Ambulatory Visit | Attending: Internal Medicine | Admitting: Internal Medicine

## 2023-09-07 DIAGNOSIS — F17210 Nicotine dependence, cigarettes, uncomplicated: Secondary | ICD-10-CM | POA: Insufficient documentation

## 2023-09-07 DIAGNOSIS — I10 Essential (primary) hypertension: Secondary | ICD-10-CM | POA: Diagnosis not present

## 2023-09-07 DIAGNOSIS — J9801 Acute bronchospasm: Secondary | ICD-10-CM | POA: Diagnosis not present

## 2023-09-07 DIAGNOSIS — C3490 Malignant neoplasm of unspecified part of unspecified bronchus or lung: Secondary | ICD-10-CM | POA: Diagnosis not present

## 2023-09-07 DIAGNOSIS — Z510 Encounter for antineoplastic radiation therapy: Secondary | ICD-10-CM | POA: Insufficient documentation

## 2023-09-07 DIAGNOSIS — C3431 Malignant neoplasm of lower lobe, right bronchus or lung: Secondary | ICD-10-CM | POA: Insufficient documentation

## 2023-09-07 NOTE — Progress Notes (Signed)
  Radiation Oncology         [phone] ________________________________  Name: Carla Lane MRN: [id number]  Date of Service: 09/07/2023  DOB: 05-22-1950  Post Treatment Telephone Note  Diagnosis: 73 yo woman with an enlarging, 3.5 cm, non-small cell carcinoma of the right lower lobe of the lung - Stage IB (cT2a, cN0, cM0), separate from the previously treated RLL nodule   The patient was available for call today.   Symptoms of fatigue have improved mildly since completing therapy.  Symptoms of skin changes have improved since completing therapy.  Symptoms of esophagitis have improved since completing therapy.  Patient is still having some mild SOB and would like a RX for her Albuterol  sent to her pharmacy on file.  The patient has scheduled follow up with her medical oncologist Dr. Byrum at [facility] for ongoing care, and was encouraged to call if she develops concerns or questions regarding radiation.   This concludes the interaction.  Rosaline Minerva, LPN

## 2023-09-13 DIAGNOSIS — M9902 Segmental and somatic dysfunction of thoracic region: Secondary | ICD-10-CM | POA: Diagnosis not present

## 2023-09-13 DIAGNOSIS — M546 Pain in thoracic spine: Secondary | ICD-10-CM | POA: Diagnosis not present

## 2023-09-13 DIAGNOSIS — M9903 Segmental and somatic dysfunction of lumbar region: Secondary | ICD-10-CM | POA: Diagnosis not present

## 2023-09-13 DIAGNOSIS — M9905 Segmental and somatic dysfunction of pelvic region: Secondary | ICD-10-CM | POA: Diagnosis not present

## 2023-09-16 ENCOUNTER — Ambulatory Visit: Admitting: Urology

## 2023-09-20 DIAGNOSIS — M9905 Segmental and somatic dysfunction of pelvic region: Secondary | ICD-10-CM | POA: Diagnosis not present

## 2023-09-20 DIAGNOSIS — M9903 Segmental and somatic dysfunction of lumbar region: Secondary | ICD-10-CM | POA: Diagnosis not present

## 2023-09-20 DIAGNOSIS — M546 Pain in thoracic spine: Secondary | ICD-10-CM | POA: Diagnosis not present

## 2023-09-20 DIAGNOSIS — M9902 Segmental and somatic dysfunction of thoracic region: Secondary | ICD-10-CM | POA: Diagnosis not present

## 2023-09-29 ENCOUNTER — Ambulatory Visit (HOSPITAL_COMMUNITY)
Admission: RE | Admit: 2023-09-29 | Discharge: 2023-09-29 | Disposition: A | Discharge status: Home / Self Care | Source: Ambulatory Visit | Attending: Urology | Admitting: Urology

## 2023-09-29 DIAGNOSIS — J439 Emphysema, unspecified: Secondary | ICD-10-CM | POA: Diagnosis not present

## 2023-09-29 DIAGNOSIS — C3490 Malignant neoplasm of unspecified part of unspecified bronchus or lung: Secondary | ICD-10-CM | POA: Diagnosis not present

## 2023-09-29 DIAGNOSIS — J90 Pleural effusion, not elsewhere classified: Secondary | ICD-10-CM | POA: Diagnosis not present

## 2023-09-29 DIAGNOSIS — C3431 Malignant neoplasm of lower lobe, right bronchus or lung: Secondary | ICD-10-CM | POA: Diagnosis not present

## 2023-09-29 DIAGNOSIS — S22000A Wedge compression fracture of unspecified thoracic vertebra, initial encounter for closed fracture: Secondary | ICD-10-CM | POA: Insufficient documentation

## 2023-09-29 DIAGNOSIS — I700 Atherosclerosis of aorta: Secondary | ICD-10-CM | POA: Diagnosis not present

## 2023-09-29 MED ORDER — IOHEXOL 300 MG/ML  SOLN
75.0000 mL | Freq: Once | INTRAMUSCULAR | Status: AC | PRN
  Administered 2023-09-29: 75 mL via INTRAVENOUS

## 2023-09-30 ENCOUNTER — Ambulatory Visit: Admitting: Urology

## 2023-09-30 NOTE — Progress Notes (Incomplete)
 Telephone nursing appointment for review of most recent CT-Chest results. I verified patient's identity x2 and began nursing interview.   Patient states issues as follows...  -Pain: *** -Fatigue: *** -Skin: *** -Lungs: *** -Appetite: ***   Patient denies any other related issues at this time.   Meaningful use complete.   Patient aware of their telephone appointment w/ Ashlyn Bruning PA-C. I left my extension [phone] in case patient needs anything. Patient verbalized understanding. This concludes the nursing interview.   Patient preferred phone # [phone]   Rosaline Minerva, LPN

## 2023-10-05 ENCOUNTER — Ambulatory Visit (HOSPITAL_COMMUNITY)
Admission: RE | Admit: 2023-10-05 | Discharge: 2023-10-05 | Disposition: A | Discharge status: Home / Self Care | Source: Ambulatory Visit | Attending: Radiation Oncology | Admitting: Radiation Oncology

## 2023-10-05 DIAGNOSIS — M40209 Unspecified kyphosis, site unspecified: Secondary | ICD-10-CM | POA: Diagnosis not present

## 2023-10-05 DIAGNOSIS — S22000A Wedge compression fracture of unspecified thoracic vertebra, initial encounter for closed fracture: Secondary | ICD-10-CM | POA: Diagnosis not present

## 2023-10-05 DIAGNOSIS — M4854XA Collapsed vertebra, not elsewhere classified, thoracic region, initial encounter for fracture: Secondary | ICD-10-CM | POA: Diagnosis not present

## 2023-10-05 MED ORDER — GADOBUTROL 1 MMOL/ML IV SOLN
5.0000 mL | Freq: Once | INTRAVENOUS | Status: AC | PRN
  Administered 2023-10-05: 5 mL via INTRAVENOUS

## 2023-10-06 ENCOUNTER — Telehealth: Payer: Self-pay | Admitting: Radiation Oncology

## 2023-10-06 ENCOUNTER — Encounter: Payer: Self-pay | Admitting: Radiation Oncology

## 2023-10-06 NOTE — Telephone Encounter (Signed)
 Contacted [facility] @ [phone] and spoke with team member who advised Dr. Lester is currently on vacation. Pt will be schedule with Dr. Janjua who has availability this week since referral was marked urgent by creator of referral.

## 2023-10-06 NOTE — Progress Notes (Signed)
 Radiation Oncology         (336) [phone] ________________________________  Name: Carla Lane MRN: [id number]  Date: 10/06/2023  DOB: 1950/12/08  Chart Note:  I reviewed this patient's most recent findings and wanted to take a minute to document my impression.  She is a very nice 73 yo woman with a history of a 3.8 cm non-small cell carcinoma of the right lower lobe of the lung - Stage IB (cT2a, cN0, cM0) for which she received SBRT 60 Gy in 5 fractions 03/11/2022 through 03/23/2022.  In routine follow-up, on Chest CT on 05/18/23 she was noted to have a new moderate compression deformity of the T8 vertebral body with kyphosis and some canal encroachment. Etiology was uncertain.  Thoracic MRI was ordered immediately, and then performed on 06/11/23 and unfortunately, not resulted until 07/07/23, which showed compression fracture at T7 resulting in approximately 50% height loss at that level. There is associated enhancement at this level. Additional areas of enhancement of the inferior endplate of T6 and superior endplate of T8.  Since the patient was having only occasional spine pain at that time and had no other evidence of stage IV cancer, we obtained MRI yesterday, demonstrating a moderate compression fracture of T8 with probable underlying metastatic disease in the right side of the vertebral body. There also appear to be metastatic lesions within the inferior vertebral body of T7 on the right and within the right T8 pedicle, right seventh and eighth ribs and right T8 spinous process.  I called the patient to discuss the findings.  I offered FDG PET, but, the patient has had a severe allergic reaction to the FDG tracer.  We discussed an neuro-interventional radiology consult to discuss the option of vertebral biopsy and kyphoplasty.  She is amenable to this approach.  In the event that she has neoplastic involvement of T7/T8, she does have a potential unstable spine based in the SINS calculation below,  but, I am optimistic, that since the vertebral spinal canal is intact, she may not require surgical stabilization with transpedical screws and posterior rods.  Spine Instability Neoplastic Score (SINS): SINS Component Description Score  Location Junctional (Occ-C2, C7-T2, T11-L1, L5-S1) Mobile (C3-6, L2-4) Semirigid (T3-10) Rigid (S2-5) 3 2 1  0  Pain Yes Occasional, non-mechanical No 3 1 0  Bone Lesion Lytic Mixed Blastic 2 1 0  Alignment Subluxation/Translation De Novo deformity Normal 4 2 0  Vertebral Body >50% collapse <50% collapse No collapse >50% VB involved None of above 3 2 1  0  Posterolateral Involvment Bilateral Unilateral 3 1   Tallied Score from 6 Components: Stable Potentially Unstable Unstable  0-6 7-12 13-18   SINS Score: 11  Fisher CG, et al. A novel classification system for spinal instability in neoplastic disease: an evidence-based approach and expert consensus from the Spine Oncology Study Group. Spine  35(22):E1221-9, 2010    Radiographic Findings: MR THORACIC SPINE W WO CONTRAST Result Date: 10/05/2023 CLINICAL DATA:  Compression fracture, thoracic Hx of metastatic lung cancer- further follow-up of compression fracture in T spine EXAM: MRI THORACIC WITHOUT AND WITH CONTRAST TECHNIQUE: Multiplanar and multiecho pulse sequences of the thoracic spine were obtained without and with intravenous contrast. CONTRAST:  5mL GADAVIST  GADOBUTROL  1 MMOL/ML IV SOLN COMPARISON:  MRI of the thoracic spine dated June 11, 2023 and CT the chest dated September 29, 2023. FINDINGS: Alignment:  Mild kyphosis.  Normal alignment otherwise. Vertebrae: A moderate compression fracture of T8 is again demonstrated the right-sided vertebral body  is moderately hypointense on T1 and demonstrates avid contrast enhancement and bone marrow edema. There are also areas within the inferior vertebral body of T7 on the right, which are hypointense on T1 and also demonstrate avid contrast  enhancement, with no enhancement of the intervening disc. There is also abnormally decreased T1 signal present within the medial aspect of the right seventh and eighth ribs, which is evident on images 19 and 22 of series 28. There also appears to be lesion within the right eighth transverse process. There is mildly prominent in Hance mint of the Clifford soft tissues and of the right T6 and T7 neural foramen. Cord: The spinal cord is normal in morphology and signal intensity. There is no abnormal enhancement of the spinal cord. Paraspinal and other soft tissues: There is no epidural mass or abnormal epidural enhancement. There appears to be consolidation or atelectasis dependently within the lower lobes. Disc levels: The disc spaces are satisfactorily preserved. There is no disc herniation or significant spinal canal stenosis throughout the thoracic spine. IMPRESSION: 1. There is a moderate compression fracture of T8 with probable underlying metastatic disease in the right side of the vertebral body. There also appear to be metastatic lesions within the inferior vertebral body of T7 on the right and within the right T8 pedicle, right seventh and eighth ribs and right T8 spinous process. Electronically Signed   By: Evalene Coho M.D.   On: 10/05/2023 19:50   CT Chest W Contrast Addendum Date: 10/01/2023 ADDENDUM REPORT: 10/01/2023 13:02 ADDENDUM: Additional finding. There is new opacification of the right lower lobe bronchus segmental. Example series 3, image 91 and 92. Sagittal series 5, image 74. Possibilities would include mucous plugging. Attention on follow-up. Electronically Signed   By: Ranell Bring M.D.   On: 10/01/2023 13:02   Result Date: 10/01/2023 CLINICAL DATA:  Non-small-cell lung cancer. * Tracking Code: BO * EXAM: CT CHEST WITH CONTRAST TECHNIQUE: Multidetector CT imaging of the chest was performed during intravenous contrast administration. RADIATION DOSE REDUCTION: This exam was performed  according to the departmental dose-optimization program which includes automated exposure control, adjustment of the mA and/or kV according to patient size and/or use of iterative reconstruction technique. CONTRAST:  75mL OMNIPAQUE  IOHEXOL  300 MG/ML  SOLN COMPARISON:  CT chest 05/18/2023 and older. FINDINGS: Cardiovascular: Heart is nonenlarged. No pericardial effusion. Diffuse vascular calcifications are identified including along the aorta, coronary arteries and great vessels. Mediastinum/Nodes: Slightly patulous esophagus. Preserved thyroid  gland. No specific abnormal lymph node enlargement identified in the axillary regions, hilum or mediastinum. Lungs/Pleura: Emphysematous lung changes identified. No pneumothorax or effusion. Tiny right pleural effusion is slightly increased today. The nodular area of opacity at the posteromedial right lower lobe is again identified, previously measuring 3.4 x 3.1 cm and today 2.9 x 2.6 cm on series 3, image 102. The adjacent 3 mm nodule lateral to this is no longer seen. Focus more superiorly and medially along the right lower lobe which previously measured 2.2 x 1.1 cm today measures 2.1 x 1.0 cm. Other areas ground-glass in the right upper lobe posterior also unchanged such as image 34 of series 3. Dependent scar atelectasis bilaterally. Upper Abdomen: Stable bilateral adrenal nodules. Again felt to be adenomas based on prior examinations. Musculoskeletal: Curvature and degenerative changes along the spine. Moderate wedge-shaped deformity along the T8 vertebral body with some sclerosis and lucency. Unchanged from previous. Global osteopenia. IMPRESSION: Stable rounded masslike opacities in the right lower lobe abutting the pleura. Please correlate with clinical  history. Tiny right pleural effusion is slightly increased from previous. No developing lymph node enlargement identified in the thorax. Aortic Atherosclerosis (ICD10-I70.0) and Emphysema (ICD10-J43.9). Electronically  Signed: By: Ranell Bring M.D. On: 10/01/2023 11:53   Impression:  In light of this information, I'll request a referral with Dr. Lester.  Plan:  At this point, the patient is agreeable to consult with Dr. Lester.  ________________________________  Donnice LABOR. Patrcia, M.D.

## 2023-10-06 NOTE — Addendum Note (Signed)
 Addended by: PATRCIA COUGH on: 10/06/2023 10:13 AM   Modules accepted: Orders

## 2023-10-06 NOTE — Progress Notes (Signed)
 Radiation Oncology         (336) [phone] ________________________________  Name: Carla Lane MRN: [id number]  Date: 07/15/2023  DOB: 1950-07-09  Chart Note:  I reviewed this patient's most recent findings and wanted to take a minute to document my impression.  She is a very nice 73 yo woman with a history of a 3.8 cm non-small cell carcinoma of the right lower lobe of the lung - Stage IB (cT2a, cN0, cM0) for which she received SBRT 60 Gy in 5 fractions 03/11/2022 through 03/23/2022.  In routine follow-up, on Chest CT on 05/18/23 she was noted to have a new moderate compression deformity of the T8 vertebral body with kyphosis and some canal encroachment. Etiology was uncertain.  Thoracic MRI was ordered immediately, and then performed on 06/11/23 and unfortunately, not resulted until 07/07/23, which showed compression fracture at T7 resulting in approximately 50% height loss at that level. There is associated enhancement at this level. Additional areas of enhancement of the inferior endplate of T6 and superior endplate of T8.  Since the patient was having only occasional spine pain at that time and had no other evidence of stage IV cancer, we obtained MRI yesterday, demonstrating a moderate compression fracture of T8 with probable underlying metastatic disease in the right side of the vertebral body. There also appear to be metastatic lesions within the inferior vertebral body of T7 on the right and within the right T8 pedicle, right seventh and eighth ribs and right T8 spinous process.  I called the patient to discuss the findings.  I offered FDG PET, but, the patient has had a severe allergic reaction to the FDG tracer.  We discussed an neuro-interventional radiology consult to discuss the option of vertebral biopsy and kyphoplasty.  She is amenable to this approach.  In the event that she has neoplastic involvement of T7/T8, she does have a potential unstable spine based in the SINS calculation below,  but, I am optimistic, that since the vertebral spinal canal is intact, she may not require surgical stabilization with transpedical screws and posterior rods.  Spine Instability Neoplastic Score (SINS): SINS Component Description Score  Location Junctional (Occ-C2, C7-T2, T11-L1, L5-S1) Mobile (C3-6, L2-4) Semirigid (T3-10) Rigid (S2-5) 3 2 1  0  Pain Yes Occasional, non-mechanical No 3 1 0  Bone Lesion Lytic Mixed Blastic 2 1 0  Alignment Subluxation/Translation De Novo deformity Normal 4 2 0  Vertebral Body >50% collapse <50% collapse No collapse >50% VB involved None of above 3 2 1  0  Posterolateral Involvment Bilateral Unilateral 3 1   Tallied Score from 6 Components: Stable Potentially Unstable Unstable  0-6 7-12 13-18   SINS Score: 11  Fisher CG, et al. A novel classification system for spinal instability in neoplastic disease: an evidence-based approach and expert consensus from the Spine Oncology Study Group. Spine  35(22):E1221-9, 2010    Radiographic Findings: MR THORACIC SPINE W WO CONTRAST Result Date: 10/05/2023 CLINICAL DATA:  Compression fracture, thoracic Hx of metastatic lung cancer- further follow-up of compression fracture in T spine EXAM: MRI THORACIC WITHOUT AND WITH CONTRAST TECHNIQUE: Multiplanar and multiecho pulse sequences of the thoracic spine were obtained without and with intravenous contrast. CONTRAST:  5mL GADAVIST  GADOBUTROL  1 MMOL/ML IV SOLN COMPARISON:  MRI of the thoracic spine dated June 11, 2023 and CT the chest dated September 29, 2023. FINDINGS: Alignment:  Mild kyphosis.  Normal alignment otherwise. Vertebrae: A moderate compression fracture of T8 is again demonstrated the right-sided vertebral body  is moderately hypointense on T1 and demonstrates avid contrast enhancement and bone marrow edema. There are also areas within the inferior vertebral body of T7 on the right, which are hypointense on T1 and also demonstrate avid contrast  enhancement, with no enhancement of the intervening disc. There is also abnormally decreased T1 signal present within the medial aspect of the right seventh and eighth ribs, which is evident on images 19 and 22 of series 28. There also appears to be lesion within the right eighth transverse process. There is mildly prominent in Hance mint of the [place] soft tissues and of the right T6 and T7 neural foramen. Cord: The spinal cord is normal in morphology and signal intensity. There is no abnormal enhancement of the spinal cord. Paraspinal and other soft tissues: There is no epidural mass or abnormal epidural enhancement. There appears to be consolidation or atelectasis dependently within the lower lobes. Disc levels: The disc spaces are satisfactorily preserved. There is no disc herniation or significant spinal canal stenosis throughout the thoracic spine. IMPRESSION: 1. There is a moderate compression fracture of T8 with probable underlying metastatic disease in the right side of the vertebral body. There also appear to be metastatic lesions within the inferior vertebral body of T7 on the right and within the right T8 pedicle, right seventh and eighth ribs and right T8 spinous process. Electronically Signed   By: Evalene Coho M.D.   On: 10/05/2023 19:50   CT Chest W Contrast Addendum Date: 10/01/2023 ADDENDUM REPORT: 10/01/2023 13:02 ADDENDUM: Additional finding. There is new opacification of the right lower lobe bronchus segmental. Example series 3, image 91 and 92. Sagittal series 5, image 74. Possibilities would include mucous plugging. Attention on follow-up. Electronically Signed   By: Ranell Bring M.D.   On: 10/01/2023 13:02   Result Date: 10/01/2023 CLINICAL DATA:  Non-small-cell lung cancer. * Tracking Code: BO * EXAM: CT CHEST WITH CONTRAST TECHNIQUE: Multidetector CT imaging of the chest was performed during intravenous contrast administration. RADIATION DOSE REDUCTION: This exam was performed  according to the departmental dose-optimization program which includes automated exposure control, adjustment of the mA and/or kV according to patient size and/or use of iterative reconstruction technique. CONTRAST:  75mL OMNIPAQUE  IOHEXOL  300 MG/ML  SOLN COMPARISON:  CT chest 05/18/2023 and older. FINDINGS: Cardiovascular: Heart is nonenlarged. No pericardial effusion. Diffuse vascular calcifications are identified including along the aorta, coronary arteries and great vessels. Mediastinum/Nodes: Slightly patulous esophagus. Preserved thyroid  gland. No specific abnormal lymph node enlargement identified in the axillary regions, hilum or mediastinum. Lungs/Pleura: Emphysematous lung changes identified. No pneumothorax or effusion. Tiny right pleural effusion is slightly increased today. The nodular area of opacity at the posteromedial right lower lobe is again identified, previously measuring 3.4 x 3.1 cm and today 2.9 x 2.6 cm on series 3, image 102. The adjacent 3 mm nodule lateral to this is no longer seen. Focus more superiorly and medially along the right lower lobe which previously measured 2.2 x 1.1 cm today measures 2.1 x 1.0 cm. Other areas ground-glass in the right upper lobe posterior also unchanged such as image 34 of series 3. Dependent scar atelectasis bilaterally. Upper Abdomen: Stable bilateral adrenal nodules. Again felt to be adenomas based on prior examinations. Musculoskeletal: Curvature and degenerative changes along the spine. Moderate wedge-shaped deformity along the T8 vertebral body with some sclerosis and lucency. Unchanged from previous. Global osteopenia. IMPRESSION: Stable rounded masslike opacities in the right lower lobe abutting the pleura. Please correlate with clinical  history. Tiny right pleural effusion is slightly increased from previous. No developing lymph node enlargement identified in the thorax. Aortic Atherosclerosis (ICD10-I70.0) and Emphysema (ICD10-J43.9). Electronically  Signed: By: Ranell Bring M.D. On: 10/01/2023 11:53   Impression:  In light of this information, I'll request a referral with Dr. Lester.  Plan:  At this point, the patient is agreeable to consult with Dr. Lester.  ________________________________  Donnice LABOR. Patrcia, M.D.

## 2023-10-08 ENCOUNTER — Ambulatory Visit: Admitting: Neurosurgery

## 2023-10-11 ENCOUNTER — Telehealth: Payer: Self-pay | Admitting: Neuroradiology

## 2023-10-11 ENCOUNTER — Inpatient Hospital Stay

## 2023-10-11 ENCOUNTER — Telehealth: Payer: Self-pay | Admitting: Radiation Therapy

## 2023-10-11 ENCOUNTER — Telehealth: Payer: Self-pay | Admitting: Radiation Oncology

## 2023-10-11 NOTE — Progress Notes (Unsigned)
 Referring Physician:  Patrcia Cough, MD [address] [place],  [place] [address]  Primary Physician:  [name], MD  History of Present Illness: 10/18/2023 [name] is here today with a chief complaint of spinal metastases.  73 year old woman with history of non-small cell lung cancer, right lower lobe she previously received SBRT 60 Gray in 5 fractions from December 2023 to January 2024  She is found to have a pathological compression fracture in March 2025 at T8 body with kyphosis and encroachment in the canal.  MRI was performed on 3/28 and final results were back in 07/07/2023.  She states that she primarily had midthoracic back pain starting around January but this is slowly improved and states that is not bothering her at all today.  She is not getting any radiating rib pain anymore.  Not getting any radiating back pain anymore.  She states that her worst symptoms are in her low back, the sometimes will radiate down her right lower extremity.  It feels electric in nature.  She is not having any instability.  No sensation loss.  No bowel or bladder issues.  She just has trouble some pain that is there all day.  Gets worse in the morning.  Conservative measures:  Physical therapy:  has not participated  Multimodal medical therapy including regular antiinflammatories: none Injections:  no epidural steroid injections  Past Surgery: no spine surgery  The symptoms are causing a significant impact on the patient's life.   I have utilized the care everywhere function in epic to review the outside records available from external health systems.  Review of Systems:  A 10 point review of systems is negative, except for the pertinent positives and negatives detailed in the HPI.  Past Medical History: Past Medical History:  Diagnosis Date   Anemia    during menstruating years   Anxiety    Arthritis    Cancer (HCC)    squamous (tip of nose) Basal - top of  shoulder, top of each ears and top of forehead   Cataracts, bilateral 05/2017   Colon polyps    Depression    Dyspnea 11/2021   GERD (gastroesophageal reflux disease)    Hypertension    does not routinely take her blood pressure medications every day.   Pneumonia    Primary open angle glaucoma 10/2015   Bilateral   Rapid heart rate    during pre-menopause - thinks it was due to a HTN medication   Restless leg syndrome 07/16/2020   Right lower lobe lung mass 09/04/2021    Past Surgical History: Past Surgical History:  Procedure Laterality Date   APPENDECTOMY     BRONCHIAL BIOPSY  02/17/2022   Procedure: BRONCHIAL BIOPSIES;  Surgeon: Brenna Adine CROME, DO;  Location: [facility];  Service: Pulmonary;;   BRONCHIAL BRUSHINGS  02/17/2022   Procedure: BRONCHIAL BRUSHINGS;  Surgeon: Brenna Adine CROME, DO;  Location: [facility];  Service: Pulmonary;;   BRONCHIAL NEEDLE ASPIRATION BIOPSY  02/17/2022   Procedure: BRONCHIAL NEEDLE ASPIRATION BIOPSIES;  Surgeon: Brenna Adine CROME, DO;  Location: [facility];  Service: Pulmonary;;   COLONOSCOPY     DENTAL SURGERY     FINE NEEDLE ASPIRATION  02/17/2022   Procedure: FINE NEEDLE ASPIRATION (FNA) LINEAR;  Surgeon: Brenna Adine CROME, DO;  Location: [facility];  Service: Pulmonary;;   LAPAROSCOPIC SALPINGO OOPHERECTOMY     LINGUAL FRENECTOMY     at 9 years   MOHS SURGERY  VIDEO BRONCHOSCOPY WITH ENDOBRONCHIAL ULTRASOUND Bilateral 02/17/2022   Procedure: VIDEO BRONCHOSCOPY WITH ENDOBRONCHIAL ULTRASOUND;  Surgeon: Brenna Adine CROME, DO;  Location: [facility];  Service: Pulmonary;  Laterality: Bilateral;    Allergies: Allergies as of 10/18/2023 - Review Complete 10/18/2023  Allergen Reaction Noted   Codeine Other (See Comments) 12/15/2015    Medications:  Current Outpatient Medications:    aspirin EC 325 MG tablet, Take 325 mg by mouth every 6 (six) hours as needed for moderate pain., Disp: , Rfl:    BREZTRI AEROSPHERE 160-9-4.8 MCG/ACT  AERO inhaler, Inhale 2 puffs into the lungs daily., Disp: , Rfl:    Iodine TINC, Apply 1 Application topically 2 (two) times a week., Disp: , Rfl:    latanoprost  (XALATAN ) 0.005 % ophthalmic solution, Place 1 drop into both eyes at bedtime., Disp: , Rfl:    Multiple Vitamins-Minerals (EMERGEN-C IMMUNE PLUS) PACK, Take 1 packet by mouth daily., Disp: , Rfl:    nicotine  (NICODERM CQ  - DOSED IN MG/24 HOURS) 14 mg/24hr patch, Place 14 mg onto the skin daily as needed (nicotine  dependence)., Disp: , Rfl:    Omega-3 Fatty Acids (SALMON OIL PO), Take 1 capsule by mouth 3 (three) times a week., Disp: , Rfl:    sacubitril-valsartan (ENTRESTO) 49-51 MG, Take 0.5 tablets by mouth daily., Disp: , Rfl:    Vitamin D, Ergocalciferol, (DRISDOL) 1.25 MG (50000 UNIT) CAPS capsule, Take 50,000 Units by mouth every Monday., Disp: , Rfl:   Social History: Social History   Tobacco Use   Smoking status: Every Day    Current packs/day: 1.00    Average packs/day: 1 pack/day for 60.0 years (60.0 ttl pk-yrs)    Types: Cigarettes   Smokeless tobacco: Never   Tobacco comments:    1/2 pk daily 07/08/23  Vaping Use   Vaping status: Former   Devices: Did a trial study for vapes  Substance Use Topics   Alcohol use: Yes    Comment: occasional   Drug use: Never    Family Medical History: History reviewed. No pertinent family history.  Physical Examination: Vitals:   10/18/23 0916  BP: (!) 188/94    General: Patient is in no apparent distress. Attention to examination is appropriate.  Neck:   Supple.  Full range of motion.  Respiratory: Patient is breathing without any difficulty.   NEUROLOGICAL:     Awake, alert, oriented to person, place, and time.  Speech is clear and fluent.   Cranial Nerves: Pupils equal round and reactive to light.  Facial tone is symmetric.  Facial sensation is symmetric. Shoulder shrug is symmetric. Tongue protrusion is midline.    Strength: No gross deficits in bilateral lower  extremities proximally or distally.  Patient is walking unassisted.  She does have mild tenderness to palpation in the mid thoracic spine correlating to the levels of her lesions.  She also has more severe tenderness to palpation in the upper lumbar spine radiates down into her lower spine and sacrum.  Her lower extremity sensation is stable.  She does not have any pathologic reflexes.  Gait is normal.    Imaging: MRI RESULTS  MR THORACIC SPINE W WO CONTRAST 10/05/2023  Narrative CLINICAL DATA:  Compression fracture, thoracic Hx of metastatic lung cancer- further follow-up of compression fracture in T spine  EXAM: MRI THORACIC WITHOUT AND WITH CONTRAST  TECHNIQUE: Multiplanar and multiecho pulse sequences of the thoracic spine were obtained without and with intravenous contrast.  CONTRAST:  5mL GADAVIST  GADOBUTROL  1 MMOL/ML  IV SOLN  COMPARISON:  MRI of the thoracic spine dated June 11, 2023 and CT the chest dated September 29, 2023.  FINDINGS: Alignment:  Mild kyphosis.  Normal alignment otherwise.  Vertebrae: A moderate compression fracture of T8 is again demonstrated the right-sided vertebral body is moderately hypointense on T1 and demonstrates avid contrast enhancement and bone marrow edema. There are also areas within the inferior vertebral body of T7 on the right, which are hypointense on T1 and also demonstrate avid contrast enhancement, with no enhancement of the intervening disc. There is also abnormally decreased T1 signal present within the medial aspect of the right seventh and eighth ribs, which is evident on images 19 and 22 of series 28. There also appears to be lesion within the right eighth transverse process. There is mildly prominent in Hance mint of the McIntosh soft tissues and of the right T6 and T7 neural foramen.  Cord: The spinal cord is normal in morphology and signal intensity. There is no abnormal enhancement of the spinal cord.  Paraspinal and other  soft tissues: There is no epidural mass or abnormal epidural enhancement. There appears to be consolidation or atelectasis dependently within the lower lobes.  Disc levels:  The disc spaces are satisfactorily preserved. There is no disc herniation or significant spinal canal stenosis throughout the thoracic spine.  IMPRESSION: 1. There is a moderate compression fracture of T8 with probable underlying metastatic disease in the right side of the vertebral body. There also appear to be metastatic lesions within the inferior vertebral body of T7 on the right and within the right T8 pedicle, right seventh and eighth ribs and right T8 spinous process.   Electronically Signed@MRIRESULTS @ By: Evalene Coho M.D. On: 10/05/2023 19:50     I have personally reviewed the images and agree with the above interpretation.  I have also reviewed her imaging, there is some hypointensity noted in her lumbar spine correlating with her point tenderness and back pain.  This does appear to have some very mild enhancement.  I am going to reach out to the radiology team to see whether or not they feel that she should benefit from formal imaging of her lumbar spine.    Medical Decision Making/Assessment and Plan: Ms. Rennie is a pleasant 73 y.o. female with non-small cell lung cancer, here today for concern for metastasis of the thoracic spine with a pathological compression fracture.  She had severe pain in January but states that her pain has gotten significantly better.  Her major pain complaint is in her lower back.  It does radiate down her right lower extremity.  She is not having any red flag symptoms.  We discussed that based off of the sins criteria that she is potentially unstable, however that she is not having any back pain and this does decrease her point value.  We discussed the difference between spinal fixation and open biopsy.  She also questioned about possible kyphoplasty and percutaneous  biopsy.  She is unclear if she has any follow-up visits with her oncology team.  I would like to reach out to the oncology providers to discuss this case further.  She has potentially unstable spine, however now with limited thoracic back pain she may not necessitate a spinal fusion prophylactically.  Thank you for involving me in the care of this patient.    Penne MICAEL Sharps MD/MSCR Neurosurgery

## 2023-10-11 NOTE — Telephone Encounter (Signed)
 I spoke with Carla Lane to verify Dr. Patrcia had reviewed her recent spine imaging results with her and discussed a referral to interventional radiologist/neurosurgery for biopsy and kyphoplasty procedure. Carla Lane confirmed that he had and that she already has an appointment scheduled for consult.   We will cancel the telephone follow-up appointment she had scheduled with Ashlyn later this week as it has already been completed by Dr. Patrcia.  Carla Lane was happy for the call and confirmation of her future appointments.   Devere Perch R.T(R)(T) Radiation Special Procedures Lead

## 2023-10-11 NOTE — Telephone Encounter (Signed)
 I received a call from [facility] following up on patient to see if they were going to see Dr Lester. She said Dr Patrcia and Dr Lester had been talking through secure chat and that he agreed to see patient but the only thing I see is a referral for spine and patient is scheduled with the [facility] Neurosurgery office in [place] to see Dr Claudene. I wanted to check to be sure and see if patient needs to see Dr Lester as well.

## 2023-10-11 NOTE — Telephone Encounter (Signed)
 Contacted [facility] to f/u on appt with Dr. Lester. They advised Dr. Lester does not treat pts with spinal fractures so they modified referral to have her seen at [facility]. Pt is scheduled with Dr. Penne Sharps 8/4. Provider team notified of this via inbasket. Dr. Patrcia and Dr. Lester were in communication via securechat and agreeable to plan but no records available of this conversation and Dr. Wendall team was not aware.

## 2023-10-13 ENCOUNTER — Ambulatory Visit: Admitting: Urology

## 2023-10-18 ENCOUNTER — Encounter: Payer: Self-pay | Admitting: Neurosurgery

## 2023-10-18 ENCOUNTER — Other Ambulatory Visit: Payer: Self-pay | Admitting: Radiation Therapy

## 2023-10-18 ENCOUNTER — Ambulatory Visit (INDEPENDENT_AMBULATORY_CARE_PROVIDER_SITE_OTHER): Admitting: Neurosurgery

## 2023-10-18 VITALS — BP 188/94 | Ht 63.0 in | Wt 115.0 lb

## 2023-10-18 DIAGNOSIS — M8448XA Pathological fracture, other site, initial encounter for fracture: Secondary | ICD-10-CM

## 2023-10-18 DIAGNOSIS — C3431 Malignant neoplasm of lower lobe, right bronchus or lung: Secondary | ICD-10-CM

## 2023-10-18 DIAGNOSIS — M545 Low back pain, unspecified: Secondary | ICD-10-CM

## 2023-10-18 DIAGNOSIS — Z85118 Personal history of other malignant neoplasm of bronchus and lung: Secondary | ICD-10-CM

## 2023-10-18 DIAGNOSIS — C7951 Secondary malignant neoplasm of bone: Secondary | ICD-10-CM

## 2023-10-18 DIAGNOSIS — M4854XA Collapsed vertebra, not elsewhere classified, thoracic region, initial encounter for fracture: Secondary | ICD-10-CM

## 2023-10-19 ENCOUNTER — Other Ambulatory Visit: Payer: Self-pay | Admitting: Radiation Therapy

## 2023-10-20 ENCOUNTER — Telehealth: Payer: Self-pay | Admitting: Radiation Therapy

## 2023-10-20 ENCOUNTER — Other Ambulatory Visit: Payer: Self-pay | Admitting: Radiation Therapy

## 2023-10-20 NOTE — Telephone Encounter (Signed)
 Spoke with [name] about the L spine MRI appointment she has scheduled on Sat 8/9. She has this information written down and plans to attend.   Devere Perch R.T(R)(T) Radiation Special Procedures Lead

## 2023-10-21 ENCOUNTER — Encounter: Payer: Self-pay | Admitting: Emergency Medicine

## 2023-10-21 ENCOUNTER — Ambulatory Visit: Admitting: Emergency Medicine

## 2023-10-21 VITALS — BP 173/84 | HR 92 | Temp 98.6°F | Ht 63.0 in | Wt 116.6 lb

## 2023-10-21 DIAGNOSIS — M4854XS Collapsed vertebra, not elsewhere classified, thoracic region, sequela of fracture: Secondary | ICD-10-CM

## 2023-10-21 DIAGNOSIS — J449 Chronic obstructive pulmonary disease, unspecified: Secondary | ICD-10-CM | POA: Diagnosis not present

## 2023-10-21 DIAGNOSIS — M8448XA Pathological fracture, other site, initial encounter for fracture: Secondary | ICD-10-CM

## 2023-10-21 DIAGNOSIS — F17210 Nicotine dependence, cigarettes, uncomplicated: Secondary | ICD-10-CM | POA: Diagnosis not present

## 2023-10-21 DIAGNOSIS — C3431 Malignant neoplasm of lower lobe, right bronchus or lung: Secondary | ICD-10-CM | POA: Diagnosis not present

## 2023-10-21 DIAGNOSIS — F17200 Nicotine dependence, unspecified, uncomplicated: Secondary | ICD-10-CM

## 2023-10-21 MED ORDER — ALBUTEROL SULFATE HFA 108 (90 BASE) MCG/ACT IN AERS: 2.0000 | INHALATION_SPRAY | Freq: Four times a day (QID) | RESPIRATORY_TRACT | 0 refills | Status: AC | PRN

## 2023-10-21 NOTE — Progress Notes (Signed)
 Subjective:    Patient ID: Carla Lane, female    DOB: 1950-04-29, 73 y.o.   MRN: [id number]  HPI  ROV 10/21/2023 --73 year old woman, active smoker with hypertension GERD, previous SBRT to a stage Ib right lower lobe non-small cell lung cancer.  I saw her in April for an adjacent rounded lesion concerning for possible spread or new primary.  We discussed possible navigational bronchoscopy but the patient had concerns about the risks and so we deferred.  She underwent a 5 fraction course of SBRT to the new lesion with Dr. Patrcia. She is considering a bx at T8 at a compression fx, but she is concerned about risk.  Currently managed on Breztri, sometimes forgets to take it. She feels that her breathing is doing ok - she is more limited by her back pain. She coughs and clears her throat a few times a day, clear some mucous in the am. No hemoptysis.   CT scan of the chest 09/29/2023 reviewed by me, shows emphysematous changes bilaterally, tiny right pleural effusion, decrease in size of the previously identified posterior medial right lower lobe, now 2.9 x 2.6 cm.  The adjacent 3 mm nodules no longer seen.  Right lower lobe superior medial nodule is also decreased in size, now 2.1 x 1.0 cm.  Other areas of groundglass in the right upper lobe are unchanged.   Review of Systems As per HPI  Past Medical History:  Diagnosis Date   Anemia    during menstruating years   Anxiety    Arthritis    Cancer (HCC)    squamous (tip of nose) Basal - top of shoulder, top of each ears and top of forehead   Cataracts, bilateral 05/2017   Colon polyps    Depression    Dyspnea 11/2021   GERD (gastroesophageal reflux disease)    Hypertension    does not routinely take her blood pressure medications every day.   Pneumonia    Primary open angle glaucoma 10/2015   Bilateral   Rapid heart rate    during pre-menopause - thinks it was due to a HTN medication   Restless leg syndrome 07/16/2020   Right lower  lobe lung mass 09/04/2021     History reviewed. No pertinent family history.   Social History   Socioeconomic History   Marital status: Married    Spouse name: Not on file   Number of children: Not on file   Years of education: Not on file   Highest education level: Not on file  Occupational History   Not on file  Tobacco Use   Smoking status: Every Day    Current packs/day: 1.00    Average packs/day: 1 pack/day for 60.0 years (60.0 ttl pk-yrs)    Types: Cigarettes   Smokeless tobacco: Never   Tobacco comments:    1/2 pk daily 07/08/23  Vaping Use   Vaping status: Former   Devices: Did a trial study for vapes  Substance and Sexual Activity   Alcohol use: Yes    Comment: occasional   Drug use: Never   Sexual activity: Not Currently    Birth control/protection: Post-menopausal  Other Topics Concern   Not on file  Social History Narrative   Not on file   Social Drivers of Health   Financial Resource Strain: Not on file  Food Insecurity: No Food Insecurity (07/15/2023)   Hunger Vital Sign    Worried About Running Out of Food in the Last Year: Never  true    Ran Out of Food in the Last Year: Never true  Transportation Needs: No Transportation Needs (07/15/2023)   PRAPARE - Administrator, Civil Service (Medical): No    Lack of Transportation (Non-Medical): No  Physical Activity: Not on file  Stress: Not on file  Social Connections: Not on file  Intimate Partner Violence: Not At Risk (07/15/2023)   Humiliation, Afraid, Rape, and Kick questionnaire    Fear of Current or Ex-Partner: No    Emotionally Abused: No    Physically Abused: No    Sexually Abused: No     Allergies  Allergen Reactions   Codeine Other (See Comments)    White spots on throat, Looks like strep throat (allergic reaction)     Outpatient Medications Prior to Visit  Medication Sig Dispense Refill   aspirin EC 325 MG tablet Take 325 mg by mouth every 6 (six) hours as needed for moderate  pain.     BREZTRI AEROSPHERE 160-9-4.8 MCG/ACT AERO inhaler Inhale 2 puffs into the lungs daily.     Iodine TINC Apply 1 Application topically 2 (two) times a week.     latanoprost  (XALATAN ) 0.005 % ophthalmic solution Place 1 drop into both eyes at bedtime.     Multiple Vitamins-Minerals (EMERGEN-C IMMUNE PLUS) PACK Take 1 packet by mouth daily.     nicotine  (NICODERM CQ  - DOSED IN MG/24 HOURS) 14 mg/24hr patch Place 14 mg onto the skin daily as needed (nicotine  dependence).     Omega-3 Fatty Acids (SALMON OIL PO) Take 1 capsule by mouth 3 (three) times a week.     sacubitril-valsartan (ENTRESTO) 49-51 MG Take 0.5 tablets by mouth daily.     Vitamin D, Ergocalciferol, (DRISDOL) 1.25 MG (50000 UNIT) CAPS capsule Take 50,000 Units by mouth every Monday.     No facility-administered medications prior to visit.        Objective:   Physical Exam  Vitals:   10/21/23 0837  BP: (!) 173/84  Pulse: 92  Temp: 98.6 F (37 C)  TempSrc: Temporal  SpO2: 98%  Weight: 116 lb 9.6 oz (52.9 kg)  Height: 5' 3 (1.6 m)   Gen: Pleasant, well-nourished, in no distress,  normal affect  ENT: No lesions,  mouth clear,  oropharynx clear, no postnasal drip  Neck: No JVD, no stridor  Lungs: No use of accessory muscles, no crackles or wheezing on normal respiration, no wheeze on forced expiration  Cardiovascular: RRR, heart sounds normal, no murmur or gallops, no peripheral edema  Musculoskeletal: No deformities, no cyanosis or clubbing  Neuro: alert, awake, non focal  Skin: Warm, no lesions or rash     Assessment & Plan:  Primary cancer of right lower lobe of lung (HCC) She underwent repeat SBRT because she was very hesitant to undergo navigational bronchoscopy.  She tolerated well, completed in early June.  Repeat imaging reassuring in July.  Plan for repeat CT scans of the chest as per Dr. Alline plans.  COPD (chronic obstructive pulmonary disease) (HCC) Fairly significant obstruction on  her PFT but she has minimal symptoms.  She is taking Breztri although not reliably.  We discussed better compliance today.  I will also give her an albuterol  inhaler to have for use in case she has acute symptoms.  Tobacco use disorder Needs smoking cessation.  Discussed briefly with her today  Compression fracture of thoracic spine, non-traumatic (HCC) Concern based on imaging that this compression fracture is related to metastatic disease.  She is discussing a possible needle biopsy and cement repair versus surgery with stabilization and biopsy.  She has not decided what she wants to do.  I did encourage her to make a plan, that following this without intervention will ultimately lead to progressive symptoms.     Lamar Chris, MD, PhD 10/21/2023, 9:18 AM [facility] [phone] or if no answer before 7:00PM call [phone] For any issues after 7:00PM please call eLink [phone]

## 2023-10-21 NOTE — Patient Instructions (Addendum)
 We reviewed your CT scan of the chest today.  Please get your repeat scanning as recommended by Dr. Patrcia Continue to follow with either interventional radiology or surgery regarding biopsy and treatment of your compression fracture at T8 Please continue Breztri 2 puffs twice a day.  Rinse and gargle after using. You can use albuterol  2 puffs if needed for shortness of breath, chest tightness, wheezing.  We will send a prescription for this to your pharmacy Follow with Dr. Shelah in 12 months or sooner if you have any problems.

## 2023-10-21 NOTE — Assessment & Plan Note (Signed)
 Fairly significant obstruction on her PFT but she has minimal symptoms.  She is taking Breztri although not reliably.  We discussed better compliance today.  I will also give her an albuterol  inhaler to have for use in case she has acute symptoms.

## 2023-10-21 NOTE — Assessment & Plan Note (Signed)
 Needs smoking cessation.  Discussed briefly with her today

## 2023-10-21 NOTE — Assessment & Plan Note (Signed)
 Concern based on imaging that this compression fracture is related to metastatic disease.  She is discussing a possible needle biopsy and cement repair versus surgery with stabilization and biopsy.  She has not decided what she wants to do.  I did encourage her to make a plan, that following this without intervention will ultimately lead to progressive symptoms.

## 2023-10-21 NOTE — Assessment & Plan Note (Signed)
 She underwent repeat SBRT because she was very hesitant to undergo navigational bronchoscopy.  She tolerated well, completed in early June.  Repeat imaging reassuring in July.  Plan for repeat CT scans of the chest as per Dr. Alline plans.

## 2023-10-23 ENCOUNTER — Ambulatory Visit (HOSPITAL_COMMUNITY)
Admission: RE | Admit: 2023-10-23 | Discharge: 2023-10-23 | Disposition: A | Discharge status: Home / Self Care | Source: Ambulatory Visit | Attending: Radiation Oncology | Admitting: Radiation Oncology

## 2023-10-23 DIAGNOSIS — R609 Edema, unspecified: Secondary | ICD-10-CM | POA: Diagnosis not present

## 2023-10-23 DIAGNOSIS — M438X6 Other specified deforming dorsopathies, lumbar region: Secondary | ICD-10-CM | POA: Diagnosis not present

## 2023-10-23 DIAGNOSIS — C799 Secondary malignant neoplasm of unspecified site: Secondary | ICD-10-CM | POA: Diagnosis not present

## 2023-10-23 DIAGNOSIS — C7951 Secondary malignant neoplasm of bone: Secondary | ICD-10-CM | POA: Insufficient documentation

## 2023-10-23 DIAGNOSIS — M47816 Spondylosis without myelopathy or radiculopathy, lumbar region: Secondary | ICD-10-CM | POA: Diagnosis not present

## 2023-10-23 MED ORDER — GADOBUTROL 1 MMOL/ML IV SOLN
5.0000 mL | Freq: Once | INTRAVENOUS | Status: AC | PRN
  Administered 2023-10-23: 5 mL via INTRAVENOUS

## 2023-10-25 ENCOUNTER — Inpatient Hospital Stay: Attending: Radiation Oncology

## 2023-10-27 ENCOUNTER — Ambulatory Visit: Payer: Self-pay | Admitting: Radiation Oncology

## 2023-10-28 ENCOUNTER — Ambulatory Visit (INDEPENDENT_AMBULATORY_CARE_PROVIDER_SITE_OTHER): Admitting: Neuroradiology

## 2023-10-28 ENCOUNTER — Ambulatory Visit: Admitting: Neuroradiology

## 2023-10-28 ENCOUNTER — Encounter: Payer: Self-pay | Admitting: Neuroradiology

## 2023-10-28 VITALS — BP 158/86 | HR 83 | Ht 63.0 in | Wt 118.0 lb

## 2023-10-28 DIAGNOSIS — M5489 Other dorsalgia: Secondary | ICD-10-CM

## 2023-10-28 DIAGNOSIS — M4854XA Collapsed vertebra, not elsewhere classified, thoracic region, initial encounter for fracture: Secondary | ICD-10-CM | POA: Diagnosis not present

## 2023-10-28 NOTE — Progress Notes (Signed)
 Chief Complaint: Patient was seen in consultation today for  Chief Complaint  Patient presents with   Referral    Pt states no new complaints   at the request of Bland,Veita  Referring Physician(s): Bland,Veita  History of Present Illness: Carla Lane is a 73 y.o. female.  She has a history of lung cancer and a pathologic compression fracture at T8.  She complains of intrascapular back pain.  The lesion has not been biopsied.  Her right lung cancer was treated with radiation.  She did not have surgery or chemotherapy.  She is having severe intrascapular pain.  She has not been able to completely manage the pain with Tylenol  but does not want to take any stronger medication.  Past Medical History:  Diagnosis Date   Anemia    during menstruating years   Anxiety    Arthritis    Cancer (HCC)    squamous (tip of nose) Basal - top of shoulder, top of each ears and top of forehead   Cataracts, bilateral 05/2017   Colon polyps    Depression    Dyspnea 11/2021   GERD (gastroesophageal reflux disease)    Hypertension    does not routinely take her blood pressure medications every day.   Pneumonia    Primary open angle glaucoma 10/2015   Bilateral   Rapid heart rate    during pre-menopause - thinks it was due to a HTN medication   Restless leg syndrome 07/16/2020   Right lower lobe lung mass 09/04/2021    Past Surgical History:  Procedure Laterality Date   APPENDECTOMY     BRONCHIAL BIOPSY  02/17/2022   Procedure: BRONCHIAL BIOPSIES;  Surgeon: Brenna Adine CROME, DO;  Location: [facility];  Service: Pulmonary;;   BRONCHIAL BRUSHINGS  02/17/2022   Procedure: BRONCHIAL BRUSHINGS;  Surgeon: Brenna Adine CROME, DO;  Location: [facility];  Service: Pulmonary;;   BRONCHIAL NEEDLE ASPIRATION BIOPSY  02/17/2022   Procedure: BRONCHIAL NEEDLE ASPIRATION BIOPSIES;  Surgeon: Brenna Adine CROME, DO;  Location: [facility];  Service: Pulmonary;;   COLONOSCOPY     DENTAL SURGERY     FINE  NEEDLE ASPIRATION  02/17/2022   Procedure: FINE NEEDLE ASPIRATION (FNA) LINEAR;  Surgeon: Brenna Adine CROME, DO;  Location: [facility];  Service: Pulmonary;;   LAPAROSCOPIC SALPINGO OOPHERECTOMY     LINGUAL FRENECTOMY     at 9 years   MOHS SURGERY     VIDEO BRONCHOSCOPY WITH ENDOBRONCHIAL ULTRASOUND Bilateral 02/17/2022   Procedure: VIDEO BRONCHOSCOPY WITH ENDOBRONCHIAL ULTRASOUND;  Surgeon: Brenna Adine CROME, DO;  Location: [facility];  Service: Pulmonary;  Laterality: Bilateral;    Allergies: Codeine  Medications: Prior to Admission medications   Medication Sig Start Date End Date Taking? Authorizing Provider  albuterol  (VENTOLIN  HFA) 108 (90 Base) MCG/ACT inhaler Inhale 2 puffs into the lungs every 6 (six) hours as needed for wheezing or shortness of breath. 10/21/23  Yes Shelah Lamar RAMAN, MD  aspirin EC 325 MG tablet Take 325 mg by mouth every 6 (six) hours as needed for moderate pain.   Yes [provider]  BREZTRI AEROSPHERE 160-9-4.8 MCG/ACT AERO inhaler Inhale 2 puffs into the lungs daily. 09/07/23  Yes [provider]  Iodine TINC Apply 1 Application topically 2 (two) times a week.   Yes [provider]  latanoprost  (XALATAN ) 0.005 % ophthalmic solution Place 1 drop into both eyes at bedtime.   Yes [provider]  Multiple Vitamins-Minerals (EMERGEN-C IMMUNE PLUS) PACK Take 1  packet by mouth daily.   Yes [provider]  nicotine  (NICODERM CQ  - DOSED IN MG/24 HOURS) 14 mg/24hr patch Place 14 mg onto the skin daily as needed (nicotine  dependence).   Yes [provider]  Omega-3 Fatty Acids (SALMON OIL PO) Take 1 capsule by mouth 3 (three) times a week.   Yes [provider]  Vitamin D, Ergocalciferol, (DRISDOL) 1.25 MG (50000 UNIT) CAPS capsule Take 50,000 Units by mouth every Monday. 08/18/21  Yes [provider]  sacubitril-valsartan (ENTRESTO) 49-51 MG Take 0.5 tablets by mouth daily. 11/10/21   [provider]     History reviewed. No pertinent family history.  Social History   Socioeconomic History   Marital status: Married    Spouse name: Not on file   Number of children: Not on file   Years of education: Not on file   Highest education level: Not on file  Occupational History   Not on file  Tobacco Use   Smoking status: Every Day    Current packs/day: 1.00    Average packs/day: 1 pack/day for 60.0 years (60.0 ttl pk-yrs)    Types: Cigarettes   Smokeless tobacco: Never   Tobacco comments:    1/2 pk daily 07/08/23  Vaping Use   Vaping status: Former   Devices: Did a trial study for vapes  Substance and Sexual Activity   Alcohol use: Yes    Comment: occasional   Drug use: Never   Sexual activity: Not Currently    Birth control/protection: Post-menopausal  Other Topics Concern   Not on file  Social History Narrative   Not on file   Social Drivers of Health   Financial Resource Strain: Not on file  Food Insecurity: No Food Insecurity (07/15/2023)   Hunger Vital Sign    Worried About Running Out of Food in the Last Year: Never true    Ran Out of Food in the Last Year: Never true  Transportation Needs: No Transportation Needs (07/15/2023)   PRAPARE - Administrator, Civil Service (Medical): No    Lack of Transportation (Non-Medical): No  Physical Activity: Not on file  Stress: Not on file  Social Connections: Not on file    Review of Systems  Respiratory:  Negative for shortness of breath.   Cardiovascular:  Negative for chest pain.    Vital Signs: BP (!) 158/86   Pulse 83   Ht 5' 3 (1.6 m)   Wt 118 lb (53.5 kg)   SpO2 99%   BMI 20.90 kg/m   Physical Exam Constitutional:      Appearance: Normal appearance.  Cardiovascular:     Rate and Rhythm: Normal rate and regular rhythm.     Pulses: Normal pulses.     Heart sounds: Normal heart sounds.  Neurological:     Mental Status: She is alert.     Imaging: MR Lumbar Spine W Wo  Contrast Result Date: 10/24/2023 EXAM: MRI LUMBAR SPINE 10/23/2023 02:36:41 PM TECHNIQUE: Multiplanar multisequence MRI of the lumbar spine was performed with and without the administration of 5mL intravenous gadobutrol  (GADAVIST ) 1 MMOL/ML. COMPARISON: None available. CLINICAL HISTORY: Metastatic disease evaluation; Evaluation of enhancement of L1 with corresponding pain. LBP; possible mets seen on T spine scan. FINDINGS: BONES AND ALIGNMENT: Slight retrolisthesis is present at L2-3. Levoconvex curvature is centered at L2-3. Asymmetric right-sided marrow edema and enhancement is likely degenerative in nature. Bilateral L5 pars defects are present with grade 1 anterolisthesis at L5 to  S1 measuring 8 mm. Degenerative edematous changes are present along the left pars defect. Abnormal marrow signal and enhancement are present within the left L5 pedicle. SPINAL CORD: The conus terminates normally. SOFT TISSUES: No paraspinal mass. L1-L2: No significant disc herniation. No spinal canal stenosis or neural foraminal narrowing. L2-L3: Chronic loss of disc height and a broad-based disc protrusion is present. Moderate facet hypertrophy is present. Mild subarticular narrowing is present bilaterally. Moderate right and mild left foraminal stenosis is present. L3-L4: Mild broad-based disc bulging and moderate bilateral facet hypertrophy is present. No significant stenosis is present. L4-L5: A leftward disc protrusion is present. Moderate facet hypertrophy is noted bilaterally. Mild left subarticular and foraminal narrowing is present. L5-S1: Uncovering of a broad-based disc protrusion is present. Moderate left and mild right foraminal stenosis is present. The central canal is patent. IMPRESSION: 1. Bilateral L5 pars defects with grade 1 anterolisthesis at L5 to S1 measuring 8 mm and degenerative edematous changes along the left pars defect. 2. Abnormal marrow signal and enhancement within the left L5 pedicle. While this may be  related to the pars defects, it raises concern for a focal metastasis. 3. Slight retrolisthesis at L2-3 with levoconvex curvature centered at L2-3. 4. Multilevel degenerative changes as detailed above. Electronically signed by: Lonni Necessary MD 10/24/2023 08:08 PM EDT RP Workstation: [id number]   MR THORACIC SPINE W WO CONTRAST Result Date: 10/05/2023 CLINICAL DATA:  Compression fracture, thoracic Hx of metastatic lung cancer- further follow-up of compression fracture in T spine EXAM: MRI THORACIC WITHOUT AND WITH CONTRAST TECHNIQUE: Multiplanar and multiecho pulse sequences of the thoracic spine were obtained without and with intravenous contrast. CONTRAST:  5mL GADAVIST  GADOBUTROL  1 MMOL/ML IV SOLN COMPARISON:  MRI of the thoracic spine dated June 11, 2023 and CT the chest dated September 29, 2023. FINDINGS: Alignment:  Mild kyphosis.  Normal alignment otherwise. Vertebrae: A moderate compression fracture of T8 is again demonstrated the right-sided vertebral body is moderately hypointense on T1 and demonstrates avid contrast enhancement and bone marrow edema. There are also areas within the inferior vertebral body of T7 on the right, which are hypointense on T1 and also demonstrate avid contrast enhancement, with no enhancement of the intervening disc. There is also abnormally decreased T1 signal present within the medial aspect of the right seventh and eighth ribs, which is evident on images 19 and 22 of series 28. There also appears to be lesion within the right eighth transverse process. There is mildly prominent in [place] mint of the [place] soft tissues and of the right T6 and T7 neural foramen. Cord: The spinal cord is normal in morphology and signal intensity. There is no abnormal enhancement of the spinal cord. Paraspinal and other soft tissues: There is no epidural mass or abnormal epidural enhancement. There appears to be consolidation or atelectasis dependently within the lower lobes. Disc levels: The  disc spaces are satisfactorily preserved. There is no disc herniation or significant spinal canal stenosis throughout the thoracic spine. IMPRESSION: 1. There is a moderate compression fracture of T8 with probable underlying metastatic disease in the right side of the vertebral body. There also appear to be metastatic lesions within the inferior vertebral body of T7 on the right and within the right T8 pedicle, right seventh and eighth ribs and right T8 spinous process. Electronically Signed   By: Evalene Coho M.D.   On: 10/05/2023 19:50   CT Chest W Contrast Addendum Date: 10/01/2023 ADDENDUM REPORT: 10/01/2023 13:02 ADDENDUM: Additional finding. There is  new opacification of the right lower lobe bronchus segmental. Example series 3, image 91 and 92. Sagittal series 5, image 74. Possibilities would include mucous plugging. Attention on follow-up. Electronically Signed   By: Ranell Bring M.D.   On: 10/01/2023 13:02   Result Date: 10/01/2023 CLINICAL DATA:  Non-small-cell lung cancer. * Tracking Code: BO * EXAM: CT CHEST WITH CONTRAST TECHNIQUE: Multidetector CT imaging of the chest was performed during intravenous contrast administration. RADIATION DOSE REDUCTION: This exam was performed according to the departmental dose-optimization program which includes automated exposure control, adjustment of the mA and/or kV according to patient size and/or use of iterative reconstruction technique. CONTRAST:  75mL OMNIPAQUE  IOHEXOL  300 MG/ML  SOLN COMPARISON:  CT chest 05/18/2023 and older. FINDINGS: Cardiovascular: Heart is nonenlarged. No pericardial effusion. Diffuse vascular calcifications are identified including along the aorta, coronary arteries and great vessels. Mediastinum/Nodes: Slightly patulous esophagus. Preserved thyroid  gland. No specific abnormal lymph node enlargement identified in the axillary regions, hilum or mediastinum. Lungs/Pleura: Emphysematous lung changes identified. No pneumothorax or  effusion. Tiny right pleural effusion is slightly increased today. The nodular area of opacity at the posteromedial right lower lobe is again identified, previously measuring 3.4 x 3.1 cm and today 2.9 x 2.6 cm on series 3, image 102. The adjacent 3 mm nodule lateral to this is no longer seen. Focus more superiorly and medially along the right lower lobe which previously measured 2.2 x 1.1 cm today measures 2.1 x 1.0 cm. Other areas ground-glass in the right upper lobe posterior also unchanged such as image 34 of series 3. Dependent scar atelectasis bilaterally. Upper Abdomen: Stable bilateral adrenal nodules. Again felt to be adenomas based on prior examinations. Musculoskeletal: Curvature and degenerative changes along the spine. Moderate wedge-shaped deformity along the T8 vertebral body with some sclerosis and lucency. Unchanged from previous. Global osteopenia. IMPRESSION: Stable rounded masslike opacities in the right lower lobe abutting the pleura. Please correlate with clinical history. Tiny right pleural effusion is slightly increased from previous. No developing lymph node enlargement identified in the thorax. Aortic Atherosclerosis (ICD10-I70.0) and Emphysema (ICD10-J43.9). Electronically Signed: By: Ranell Bring M.D. On: 10/01/2023 11:53    Labs:  CBC: Last result was 3-24, hemoglobin 12.5, platelet count 232,000  BMP: Last result 05/17/2022, potassium was 3.4   Assessment:  Pathologic T8 compression fracture with severe pain.  Biopsy needed for further management.  She is having significant pain and we will also do a vertebral augmentation to try and help with the pain.  I have explained the procedure to the patient including the small risk of a serious complication such as infection or leakage of cement in the spinal canal which could require emergent surgery.  Plan:  T8 vertebral biopsy and kyphoplasty  ASA score: 2  Mallampati: 1   Electronically Signed: Nancyann LULLA Burns   10/28/2023, 3:59 PM

## 2023-10-28 NOTE — Patient Instructions (Addendum)
 Please see below for information in regards to your upcoming procedure.  Planned procedure : IR Biopsy and Arthophoplasty   Time: You will receive a call to schedule your procedure with our Interventional Radiology Department. Should you not hear anything within 7 days after your office visit, please follow up with us  so we can follow up on scheduling.   Location: [place] Entrance A -  [address] [place], [place] [address]   Eating/ Drinking: You should not eat or drink after midnight (NPO)  before your procedure. You can have small sips of clear liquids with your morning medications but everything else should be avoided.      Aspirin 325 mg: Hold the morning of procedure   How to contact us :  If you have any questions/concerns before or after your Procedure you can reach us  at [phone], or you can send a mychart message. We can be reached by phone or mychart 8am-4pm, Monday-Friday.   Registered Nurse/Surgery scheduler:  Tinnie HERO RN-BSN Medical Assistants: Jesslyn DEL CMA Doctors: Dino Sable MD, Nancyann Burns MD

## 2023-11-02 ENCOUNTER — Telehealth: Payer: Self-pay | Admitting: Neuroradiology

## 2023-11-02 NOTE — Telephone Encounter (Signed)
 Carla Lane from [facility], Dr Alline office called to follow up and see when the kypho and biopsy  is scheduled. Please call back to let know at [phone], Or can message in Epic.

## 2023-11-03 DIAGNOSIS — M4854XA Collapsed vertebra, not elsewhere classified, thoracic region, initial encounter for fracture: Secondary | ICD-10-CM | POA: Diagnosis not present

## 2023-11-03 NOTE — Addendum Note (Signed)
 Addended by: Travontae Freiberger on: 11/03/2023 02:29 PM   Modules accepted: Orders

## 2023-11-03 NOTE — Telephone Encounter (Signed)
 I called Carla Lane to let her know that her know that IR should be reaching out to schedule her procedure but that Dr. Lester had wanted her to get a CBC and BMP in the meantime [facility].

## 2023-11-04 LAB — CBC WITH DIFFERENTIAL/PLATELET
Absolute Lymphocytes: 1199 {cells}/uL (ref 850–3900)
Absolute Monocytes: 713 {cells}/uL (ref 200–950)
Basophils Absolute: 81 {cells}/uL (ref 0–200)
Basophils Relative: 1 %
Eosinophils Absolute: 57 {cells}/uL (ref 15–500)
Eosinophils Relative: 0.7 %
HCT: 42.9 % (ref 35.0–45.0)
Hemoglobin: 14.4 g/dL (ref 11.7–15.5)
MCH: 30.2 pg (ref 27.0–33.0)
MCHC: 33.6 g/dL (ref 32.0–36.0)
MCV: 89.9 fL (ref 80.0–100.0)
MPV: 9.9 fL (ref 7.5–12.5)
Monocytes Relative: 8.8 %
Neutro Abs: 6051 {cells}/uL (ref 1500–7800)
Neutrophils Relative %: 74.7 %
Platelets: 301 Thousand/uL (ref 140–400)
RBC: 4.77 Million/uL (ref 3.80–5.10)
RDW: 12.7 % (ref 11.0–15.0)
Total Lymphocyte: 14.8 %
WBC: 8.1 Thousand/uL (ref 3.8–10.8)

## 2023-11-04 LAB — BASIC METABOLIC PANEL WITH GFR
BUN: 17 mg/dL (ref 7–25)
CO2: 27 mmol/L (ref 20–32)
Calcium: 10.1 mg/dL (ref 8.6–10.4)
Chloride: 104 mmol/L (ref 98–110)
Creat: 0.8 mg/dL (ref 0.60–1.00)
Glucose, Bld: 95 mg/dL (ref 65–99)
Potassium: 5.1 mmol/L (ref 3.5–5.3)
Sodium: 140 mmol/L (ref 135–146)
eGFR: 77 mL/min/1.73m2 (ref >=60)

## 2023-11-09 ENCOUNTER — Other Ambulatory Visit: Payer: Self-pay

## 2023-11-09 ENCOUNTER — Other Ambulatory Visit (HOSPITAL_COMMUNITY): Payer: Self-pay | Admitting: Neuroradiology

## 2023-11-09 DIAGNOSIS — S22060A Wedge compression fracture of T7-T8 vertebra, initial encounter for closed fracture: Secondary | ICD-10-CM

## 2023-11-09 DIAGNOSIS — M4854XA Collapsed vertebra, not elsewhere classified, thoracic region, initial encounter for fracture: Secondary | ICD-10-CM

## 2023-11-10 ENCOUNTER — Ambulatory Visit: Admitting: Neuroradiology

## 2023-11-11 ENCOUNTER — Other Ambulatory Visit: Payer: Self-pay | Admitting: Radiation Therapy

## 2023-11-11 ENCOUNTER — Other Ambulatory Visit: Payer: Self-pay

## 2023-11-11 ENCOUNTER — Emergency Department (HOSPITAL_COMMUNITY)
Admission: EM | Admit: 2023-11-11 | Discharge: 2023-11-11 | Discharge status: Against Medical Advice | Attending: Emergency Medicine | Admitting: Emergency Medicine

## 2023-11-11 ENCOUNTER — Encounter (HOSPITAL_COMMUNITY): Payer: Self-pay

## 2023-11-11 ENCOUNTER — Ambulatory Visit (HOSPITAL_BASED_OUTPATIENT_CLINIC_OR_DEPARTMENT_OTHER)
Admission: RE | Admit: 2023-11-11 | Discharge: 2023-11-11 | Disposition: A | Discharge status: Home / Self Care | Source: Ambulatory Visit | Attending: Neuroradiology | Admitting: Neuroradiology

## 2023-11-11 DIAGNOSIS — Z85118 Personal history of other malignant neoplasm of bronchus and lung: Secondary | ICD-10-CM | POA: Insufficient documentation

## 2023-11-11 DIAGNOSIS — M549 Dorsalgia, unspecified: Secondary | ICD-10-CM | POA: Diagnosis not present

## 2023-11-11 DIAGNOSIS — M8588 Other specified disorders of bone density and structure, other site: Secondary | ICD-10-CM

## 2023-11-11 DIAGNOSIS — Z7982 Long term (current) use of aspirin: Secondary | ICD-10-CM | POA: Insufficient documentation

## 2023-11-11 DIAGNOSIS — R110 Nausea: Secondary | ICD-10-CM

## 2023-11-11 DIAGNOSIS — R112 Nausea with vomiting, unspecified: Secondary | ICD-10-CM | POA: Diagnosis not present

## 2023-11-11 DIAGNOSIS — M4854XA Collapsed vertebra, not elsewhere classified, thoracic region, initial encounter for fracture: Secondary | ICD-10-CM

## 2023-11-11 DIAGNOSIS — Z853 Personal history of malignant neoplasm of breast: Secondary | ICD-10-CM | POA: Diagnosis not present

## 2023-11-11 DIAGNOSIS — Z5329 Procedure and treatment not carried out because of patient's decision for other reasons: Secondary | ICD-10-CM | POA: Insufficient documentation

## 2023-11-11 DIAGNOSIS — M546 Pain in thoracic spine: Secondary | ICD-10-CM | POA: Diagnosis not present

## 2023-11-11 DIAGNOSIS — S22060A Wedge compression fracture of T7-T8 vertebra, initial encounter for closed fracture: Secondary | ICD-10-CM | POA: Insufficient documentation

## 2023-11-11 DIAGNOSIS — F17210 Nicotine dependence, cigarettes, uncomplicated: Secondary | ICD-10-CM | POA: Diagnosis not present

## 2023-11-11 DIAGNOSIS — X58XXXA Exposure to other specified factors, initial encounter: Secondary | ICD-10-CM | POA: Insufficient documentation

## 2023-11-11 DIAGNOSIS — Z923 Personal history of irradiation: Secondary | ICD-10-CM | POA: Diagnosis not present

## 2023-11-11 HISTORY — PX: IR VERTEBROPLASTY CERV/THOR BX INC UNI/BIL INC/INJECT/IMAGING: IMG5515

## 2023-11-11 LAB — CBC
HCT: 43.6 % (ref 36.0–46.0)
Hemoglobin: 14.5 g/dL (ref 12.0–15.0)
MCH: 29.5 pg (ref 26.0–34.0)
MCHC: 33.3 g/dL (ref 30.0–36.0)
MCV: 88.8 fL (ref 80.0–100.0)
Platelets: 281 K/uL (ref 150–400)
RBC: 4.91 MIL/uL (ref 3.87–5.11)
RDW: 13.2 % (ref 11.5–15.5)
WBC: 6.4 K/uL (ref 4.0–10.5)
nRBC: 0 % (ref 0.0–0.2)

## 2023-11-11 LAB — BASIC METABOLIC PANEL WITH GFR
Anion gap: 9 (ref 5–15)
Anion gap: 11 (ref 5–15)
BUN: 25 mg/dL — ABNORMAL HIGH (ref 8–23)
BUN: 26 mg/dL — ABNORMAL HIGH (ref 8–23)
CO2: 22 mmol/L (ref 22–32)
CO2: 20 mmol/L — ABNORMAL LOW (ref 22–32)
Calcium: 9.2 mg/dL (ref 8.9–10.3)
Calcium: 9.2 mg/dL (ref 8.9–10.3)
Chloride: 106 mmol/L (ref 98–111)
Chloride: 107 mmol/L (ref 98–111)
Creatinine, Ser: 0.82 mg/dL (ref 0.44–1.00)
Creatinine, Ser: 0.95 mg/dL (ref 0.44–1.00)
GFR, Estimated: >60 mL/min (ref >60)
GFR, Estimated: >60 mL/min (ref >60)
Glucose, Bld: 90 mg/dL (ref 70–99)
Glucose, Bld: 109 mg/dL — ABNORMAL HIGH (ref 70–99)
Potassium: 4 mmol/L (ref 3.5–5.1)
Potassium: 3.7 mmol/L (ref 3.5–5.1)
Sodium: 137 mmol/L (ref 135–145)
Sodium: 138 mmol/L (ref 135–145)

## 2023-11-11 LAB — CBC WITH DIFFERENTIAL/PLATELET
Abs Immature Granulocytes: 0.13 K/uL — ABNORMAL HIGH (ref 0.00–0.07)
Basophils Absolute: 0.1 K/uL (ref 0.0–0.1)
Basophils Relative: 1 %
Eosinophils Absolute: 0 K/uL (ref 0.0–0.5)
Eosinophils Relative: 0 %
HCT: 46.7 % — ABNORMAL HIGH (ref 36.0–46.0)
Hemoglobin: 15.3 g/dL — ABNORMAL HIGH (ref 12.0–15.0)
Immature Granulocytes: 1 %
Lymphocytes Relative: 6 %
Lymphs Abs: 0.8 K/uL (ref 0.7–4.0)
MCH: 29.9 pg (ref 26.0–34.0)
MCHC: 32.8 g/dL (ref 30.0–36.0)
MCV: 91.4 fL (ref 80.0–100.0)
Monocytes Absolute: 0.9 K/uL (ref 0.1–1.0)
Monocytes Relative: 7 %
Neutro Abs: 10.3 K/uL — ABNORMAL HIGH (ref 1.7–7.7)
Neutrophils Relative %: 85 %
Platelets: 288 K/uL (ref 150–400)
RBC: 5.11 MIL/uL (ref 3.87–5.11)
RDW: 13.2 % (ref 11.5–15.5)
WBC: 12.2 K/uL — ABNORMAL HIGH (ref 4.0–10.5)
nRBC: 0 % (ref 0.0–0.2)

## 2023-11-11 MED ORDER — MIDAZOLAM HCL 2 MG/2ML IJ SOLN
INTRAMUSCULAR | Status: AC
  Filled 2023-11-11: qty 2

## 2023-11-11 MED ORDER — FENTANYL CITRATE (PF) 100 MCG/2ML IJ SOLN
INTRAMUSCULAR | Status: AC | PRN
  Administered 2023-11-11: 50 ug via INTRAVENOUS
  Administered 2023-11-11: 25 ug via INTRAVENOUS

## 2023-11-11 MED ORDER — LIDOCAINE HCL (PF) 2 % IJ SOLN
INTRAMUSCULAR | Status: AC
  Filled 2023-11-11: qty 10

## 2023-11-11 MED ORDER — LIDOCAINE HCL (PF) 1 % IJ SOLN: 30.0000 mL | Freq: Once | INTRAMUSCULAR | Status: DC

## 2023-11-11 MED ORDER — MIDAZOLAM HCL 2 MG/2ML IJ SOLN
INTRAMUSCULAR | Status: AC | PRN
  Administered 2023-11-11: 1 mg via INTRAVENOUS
  Administered 2023-11-11: .5 mg via INTRAVENOUS

## 2023-11-11 MED ORDER — LIDOCAINE HCL (PF) 1 % IJ SOLN
INTRAMUSCULAR | Status: AC
  Filled 2023-11-11: qty 5

## 2023-11-11 MED ORDER — CEFAZOLIN SODIUM-DEXTROSE 2-4 GM/100ML-% IV SOLN
INTRAVENOUS | Status: AC | PRN
  Administered 2023-11-11: 2 g via INTRAVENOUS

## 2023-11-11 MED ORDER — FENTANYL CITRATE (PF) 100 MCG/2ML IJ SOLN
INTRAMUSCULAR | Status: AC
  Filled 2023-11-11: qty 2

## 2023-11-11 MED ORDER — CEFAZOLIN SODIUM-DEXTROSE 2-4 GM/100ML-% IV SOLN
INTRAVENOUS | Status: AC
  Filled 2023-11-11: qty 100

## 2023-11-11 NOTE — ED Notes (Signed)
 Patient c/o pain from laying down, she is now walking up and down hall with steady gait.

## 2023-11-11 NOTE — ED Notes (Signed)
 Patient given ginger ale to settle stomach.

## 2023-11-11 NOTE — ED Notes (Signed)
 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 I asked patient to leave room, and stated that it was highly inappropriate for her to come into another patient's room. She then began yelling and cursing, stating inappropriate my ass. Patient then yelled and stated she was leaving.

## 2023-11-11 NOTE — ED Provider Notes (Addendum)
 [facility] Provider Note   CSN: [id number] Arrival date & time: 11/11/23  1320  Patient presents with: Nausea and Emesis   Carla Lane is a 73 y.o. female.  -T8 biopsy and vertebroplasty earlier this morning with IR -Felt well after -After getting home, suddenly felt nauseous and vomited 4-5 times, nonbloody -Nausea has since improved, no abdominal pain  -Last BM yesterday, normal -No fever, no chest pain or new difficulty breathing  PMH Breast cx s/p radiation, never received chemo   Emesis Associated symptoms: no diarrhea and no fever     Prior to Admission medications   Medication Sig Start Date End Date Taking? Authorizing Provider  albuterol  (VENTOLIN  HFA) 108 (90 Base) MCG/ACT inhaler Inhale 2 puffs into the lungs every 6 (six) hours as needed for wheezing or shortness of breath. 10/21/23   Shelah Lamar RAMAN, MD  aspirin EC 325 MG tablet Take 325 mg by mouth every 6 (six) hours as needed for moderate pain.    [provider]  BREZTRI AEROSPHERE 160-9-4.8 MCG/ACT AERO inhaler Inhale 2 puffs into the lungs daily. 09/07/23   [provider]  Iodine TINC Apply 1 Application topically 2 (two) times a week.    [provider]  latanoprost  (XALATAN ) 0.005 % ophthalmic solution Place 1 drop into both eyes at bedtime.    [provider]  Multiple Vitamins-Minerals (EMERGEN-C IMMUNE PLUS) PACK Take 1 packet by mouth daily.    [provider]  nicotine  (NICODERM CQ  - DOSED IN MG/24 HOURS) 14 mg/24hr patch Place 14 mg onto the skin daily as needed (nicotine  dependence).    [provider]  Omega-3 Fatty Acids (SALMON OIL PO) Take 1 capsule by mouth 3 (three) times a week.    [provider]  sacubitril-valsartan (ENTRESTO) 49-51 MG Take 0.5 tablets by mouth daily. 11/10/21   [provider]  Vitamin D, Ergocalciferol, (DRISDOL) 1.25 MG (50000 UNIT) CAPS capsule Take 50,000  Units by mouth every Monday. 08/18/21   [provider]    Allergies: Codeine    Review of Systems  Constitutional:  Negative for fever.  Respiratory:  Negative for shortness of breath.   Cardiovascular:  Negative for chest pain.  Gastrointestinal:  Positive for nausea and vomiting. Negative for diarrhea.    Updated Vital Signs BP 115/82   Pulse 73   Temp (!) 97.4 F (36.3 C) (Temporal)   Resp 16   SpO2 100%   Physical Exam Constitutional:      General: She is not in acute distress. HENT:     Mouth/Throat:     Mouth: Mucous membranes are moist.  Cardiovascular:     Rate and Rhythm: Normal rate and regular rhythm.  Pulmonary:     Effort: Pulmonary effort is normal.     Breath sounds: Normal breath sounds.  Abdominal:     General: Abdomen is flat. Bowel sounds are normal.     Palpations: Abdomen is soft.     Tenderness: There is no abdominal tenderness.  Neurological:     Mental Status: She is alert.     (all labs ordered are listed, but only abnormal results are displayed) Labs Reviewed  BASIC METABOLIC PANEL WITH GFR - Abnormal; Notable for the following components:      Result Value   CO2 20 (*)    Glucose, Bld 109 (*)    BUN 26 (*)    All other components within normal limits  CBC WITH DIFFERENTIAL/PLATELET - Abnormal; Notable for the following components:   WBC 12.2 (*)    Hemoglobin 15.3 (*)    HCT 46.7 (*)    Neutro Abs 10.3 (*)    Abs Immature Granulocytes 0.13 (*)    All other components within normal limits    EKG: None  Radiology: No results found.  Medications Ordered in the ED - No data to display  Medical Decision Making Amount and/or Complexity of Data Reviewed Labs: ordered.   Carla Lane is a 73 y.o. female is here with nausea and vomiting.    Lab Tests:  CBC BMP  Plan Patient presented for self-resolving N/V.   Initial workup collected and patient advised to remain for results, though patient eloped before  results were returned/discussed. Patient was alert and ambulatory with stable vitals while in ED. Lab workup largely unremarkable.    Final diagnoses:  None    ED Discharge Orders     None          Diona Perkins, MD 11/11/23 1542    Diona Perkins, MD 11/11/23 1603    Garrick Charleston, MD 11/16/23 [phone]

## 2023-11-11 NOTE — Progress Notes (Signed)
 PT ambulated to the bathroom with staff was able to void without difficulty. Discharge instruction reviewed with patient and husband at bedside. Denies questions to concerns. Pt was escorted from the unit via wheel chair to personal vehicle.

## 2023-11-11 NOTE — CV Procedure (Signed)
  NEUROSURGERY BRIEF OPERATIVE  NOTE   PREOP DX: T8 pathologic compression fracture  POSTOP DX: Same  PROCEDURE: T8 biopsy and vertebroplasty  SURGEON: Nancyann LULLA Burns   ANESTHESIA: IV Sedation with Local  EBL: Minimal  SPECIMENS: None  COMPLICATIONS: None  CONDITION: Stable to recovery  FINDINGS (Full report in [facility]): 1.  Specimen taken through the right pedicle of T8. 2.  T8 vertebroplasty   Nancyann LULLA Burns  @today @ 8:42 AM

## 2023-11-11 NOTE — Progress Notes (Signed)
 During presurgical assessment, pt scored high for suicide risk. PT states that was related to extreme pain, now that pain is controlled pt denies thoughts of self harm.

## 2023-11-11 NOTE — ED Triage Notes (Signed)
 Pt BIB EMS with c/o N/V. Seen today at IR for biopsy between shoulder blades. Reports she got nauseous when she got home and was unable to get up her steps. C/O pain at her puncture site where she got the biopsy done.

## 2023-11-16 DIAGNOSIS — J449 Chronic obstructive pulmonary disease, unspecified: Secondary | ICD-10-CM | POA: Diagnosis not present

## 2023-11-16 DIAGNOSIS — M818 Other osteoporosis without current pathological fracture: Secondary | ICD-10-CM | POA: Diagnosis not present

## 2023-11-16 DIAGNOSIS — F17200 Nicotine dependence, unspecified, uncomplicated: Secondary | ICD-10-CM | POA: Diagnosis not present

## 2023-11-16 DIAGNOSIS — C3431 Malignant neoplasm of lower lobe, right bronchus or lung: Secondary | ICD-10-CM | POA: Diagnosis not present

## 2023-11-16 LAB — SURGICAL PATHOLOGY

## 2023-11-22 ENCOUNTER — Other Ambulatory Visit: Payer: Self-pay | Admitting: Urology

## 2023-11-22 ENCOUNTER — Telehealth: Payer: Self-pay | Admitting: Neuroradiology

## 2023-11-22 ENCOUNTER — Inpatient Hospital Stay: Attending: Radiation Oncology

## 2023-11-22 DIAGNOSIS — C3431 Malignant neoplasm of lower lobe, right bronchus or lung: Secondary | ICD-10-CM

## 2023-11-22 NOTE — Telephone Encounter (Signed)
 Patient called the office today requesting a call back regarding the outcome of her surgery and pathology results.  She is wondering about follow up appointments.  She can be contacted at [phone].

## 2023-11-22 NOTE — Telephone Encounter (Signed)
 I contacted patient and scheduled her for a follow up with Dr Lester on Wednesday 9/10 at 1:00pm

## 2023-11-24 ENCOUNTER — Encounter: Payer: Self-pay | Admitting: Neuroradiology

## 2023-11-24 ENCOUNTER — Other Ambulatory Visit: Payer: Self-pay | Admitting: Urology

## 2023-11-24 ENCOUNTER — Ambulatory Visit (INDEPENDENT_AMBULATORY_CARE_PROVIDER_SITE_OTHER): Admitting: Neuroradiology

## 2023-11-24 VITALS — BP 134/83 | HR 87 | Ht 63.0 in | Wt 115.0 lb

## 2023-11-24 DIAGNOSIS — M4854XD Collapsed vertebra, not elsewhere classified, thoracic region, subsequent encounter for fracture with routine healing: Secondary | ICD-10-CM | POA: Diagnosis not present

## 2023-11-24 DIAGNOSIS — Z4889 Encounter for other specified surgical aftercare: Secondary | ICD-10-CM

## 2023-11-24 DIAGNOSIS — C7951 Secondary malignant neoplasm of bone: Secondary | ICD-10-CM

## 2023-11-24 DIAGNOSIS — M4854XS Collapsed vertebra, not elsewhere classified, thoracic region, sequela of fracture: Secondary | ICD-10-CM

## 2023-11-24 DIAGNOSIS — C3431 Malignant neoplasm of lower lobe, right bronchus or lung: Secondary | ICD-10-CM

## 2023-11-24 NOTE — Progress Notes (Signed)
 Carla Lane had a T8 vertebral body biopsy and vertebroplasty on November 11, 2023.  She has a history of lung cancer.  The lesion itself is a little suspicious for a metastasis, and there is an adjacent rib lesion on the right.  Her biopsy did not show malignancy.  The procedure was technically successful, but clinically she does not feel much better.  She says her pain is a 3-4 on a scale of 1-10.  However, she is not taking any pain medication, not even Tylenol  or ibuprofen.  She does not seem to have any functional limitation.  She moves without any difficulty.  I suggested that she try ibuprofen to help with her pain.  She may also try Tylenol .  I have not scheduled any routine follow-up with me.  I am glad to see her again at any time as needed.  She has a follow-up with Dr. Gatha Gatha in oncology.  Assessment:  No complications from T8 vertebral biopsy and vertebroplasty on November 11, 2023. She continues to have some pain, but no functional limitation.  Recommendation:  I have suggested Tylenol  or ibuprofen for the pain.  I do think clinical follow-up is important as I am a little suspicious of the negative biopsy based on the MRI appearance of the lesion.  She has follow-up with Dr. Gatha Gatha.

## 2023-11-25 ENCOUNTER — Other Ambulatory Visit: Payer: Self-pay | Admitting: Radiation Therapy

## 2023-11-26 NOTE — Progress Notes (Deleted)
 [facility] [phone]   [phone]) [phone]  CONSULT NOTE  REFERRING PHYSICIAN:  REASON FOR CONSULTATION:  ***  HPI Carla Lane is a 73 y.o. female with a medical        here is a moderate compression fracture of T8 with probable underlying metastatic disease in the right side of the vertebral body. There also appear to be metastatic lesions within the inferior vertebral body of T7 on the right and within the right T8 pedicle, right seventh and eighth ribs and right T8 spinous process.    Underwent biopsy on *** which was negative for malignancy.     HPI  Past Medical History:  Diagnosis Date   Anemia    during menstruating years   Anxiety    Arthritis    Cancer (HCC)    squamous (tip of nose) Basal - top of shoulder, top of each ears and top of forehead   Cataracts, bilateral 05/2017   Colon polyps    Depression    Dyspnea 11/2021   GERD (gastroesophageal reflux disease)    Hypertension    does not routinely take her blood pressure medications every day.   Pneumonia    Primary open angle glaucoma 10/2015   Bilateral   Rapid heart rate    during pre-menopause - thinks it was due to a HTN medication   Restless leg syndrome 07/16/2020   Right lower lobe lung mass 09/04/2021    Past Surgical History:  Procedure Laterality Date   APPENDECTOMY     BRONCHIAL BIOPSY  02/17/2022   Procedure: BRONCHIAL BIOPSIES;  Surgeon: Brenna Adine CROME, DO;  Location: [facility];  Service: Pulmonary;;   BRONCHIAL BRUSHINGS  02/17/2022   Procedure: BRONCHIAL BRUSHINGS;  Surgeon: Brenna Adine CROME, DO;  Location: [facility];  Service: Pulmonary;;   BRONCHIAL NEEDLE ASPIRATION BIOPSY  02/17/2022   Procedure: BRONCHIAL NEEDLE ASPIRATION BIOPSIES;  Surgeon: Brenna Adine CROME, DO;  Location: [facility];  Service: Pulmonary;;   COLONOSCOPY     DENTAL SURGERY     FINE NEEDLE ASPIRATION  02/17/2022   Procedure: FINE NEEDLE ASPIRATION (FNA)  LINEAR;  Surgeon: Brenna Adine CROME, DO;  Location: [facility];  Service: Pulmonary;;   IR VERTEBROPLASTY CERV/THOR BX INC UNI/BIL INC/INJECT/IMAGING  11/11/2023   LAPAROSCOPIC SALPINGO OOPHERECTOMY     LINGUAL FRENECTOMY     at 9 years   MOHS SURGERY     VIDEO BRONCHOSCOPY WITH ENDOBRONCHIAL ULTRASOUND Bilateral 02/17/2022   Procedure: VIDEO BRONCHOSCOPY WITH ENDOBRONCHIAL ULTRASOUND;  Surgeon: Brenna Adine CROME, DO;  Location: [facility];  Service: Pulmonary;  Laterality: Bilateral;    No family history on file.  Social History Social History   Tobacco Use   Smoking status: Every Day    Current packs/day: 1.00    Average packs/day: 1 pack/day for 60.0 years (60.0 ttl pk-yrs)    Types: Cigarettes   Smokeless tobacco: Never   Tobacco comments:    1/2 pk daily 07/08/23  Vaping Use   Vaping status: Former   Devices: Did a trial study for vapes  Substance Use Topics   Alcohol use: Yes    Comment: occasional   Drug use: Never    Allergies  Allergen Reactions   Codeine Other (See Comments)    White spots on throat, Looks like strep throat (allergic reaction)    Current Outpatient Medications  Medication Sig Dispense Refill   albuterol  (VENTOLIN  HFA) 108 (90 Base) MCG/ACT inhaler Inhale 2 puffs into  the lungs every 6 (six) hours as needed for wheezing or shortness of breath. 6.7 g 0   aspirin EC 325 MG tablet Take 325 mg by mouth every 6 (six) hours as needed for moderate pain.     BREZTRI AEROSPHERE 160-9-4.8 MCG/ACT AERO inhaler Inhale 2 puffs into the lungs daily.     Iodine TINC Apply 1 Application topically 2 (two) times a week.     latanoprost  (XALATAN ) 0.005 % ophthalmic solution Place 1 drop into both eyes at bedtime.     Multiple Vitamins-Minerals (EMERGEN-C IMMUNE PLUS) PACK Take 1 packet by mouth daily.     nicotine  (NICODERM CQ  - DOSED IN MG/24 HOURS) 14 mg/24hr patch Place 14 mg onto the skin daily as needed (nicotine  dependence).     Omega-3 Fatty Acids (SALMON  OIL PO) Take 1 capsule by mouth 3 (three) times a week.     sacubitril-valsartan (ENTRESTO) 49-51 MG Take 0.5 tablets by mouth daily.     Vitamin D, Ergocalciferol, (DRISDOL) 1.25 MG (50000 UNIT) CAPS capsule Take 50,000 Units by mouth every Monday.     No current facility-administered medications for this visit.    REVIEW OF SYSTEMS:   Review of Systems  Constitutional: Negative for appetite change, chills, fatigue, fever and unexpected weight change.  HENT:   Negative for mouth sores, nosebleeds, sore throat and trouble swallowing.   Eyes: Negative for eye problems and icterus.  Respiratory: Negative for cough, hemoptysis, shortness of breath and wheezing.   Cardiovascular: Negative for chest pain and leg swelling.  Gastrointestinal: Negative for abdominal pain, constipation, diarrhea, nausea and vomiting.  Genitourinary: Negative for bladder incontinence, difficulty urinating, dysuria, frequency and hematuria.   Musculoskeletal: Negative for back pain, gait problem, neck pain and neck stiffness.  Skin: Negative for itching and rash.  Neurological: Negative for dizziness, extremity weakness, gait problem, headaches, light-headedness and seizures.  Hematological: Negative for adenopathy. Does not bruise/bleed easily.  Psychiatric/Behavioral: Negative for confusion, depression and sleep disturbance. The patient is not nervous/anxious.     PHYSICAL EXAMINATION:  There were no vitals taken for this visit.  ECOG PERFORMANCE STATUS: {CHL ONC ECOG [id number]  Physical Exam  Constitutional: Oriented to person, place, and time and well-developed, well-nourished, and in no distress. No distress.  HENT:  Head: Normocephalic and atraumatic.  Mouth/Throat: Oropharynx is clear and moist. No oropharyngeal exudate.  Eyes: Conjunctivae are normal. Right eye exhibits no discharge. Left eye exhibits no discharge. No scleral icterus.  Neck: Normal range of motion. Neck supple.  Cardiovascular:  Normal rate, regular rhythm, normal heart sounds and intact distal pulses.   Pulmonary/Chest: Effort normal and breath sounds normal. No respiratory distress. No wheezes. No rales.  Abdominal: Soft. Bowel sounds are normal. Exhibits no distension and no mass. There is no tenderness.  Musculoskeletal: Normal range of motion. Exhibits no edema.  Lymphadenopathy:    No cervical adenopathy.  Neurological: Alert and oriented to person, place, and time. Exhibits normal muscle tone. Gait normal. Coordination normal.  Skin: Skin is warm and dry. No rash noted. Not diaphoretic. No erythema. No pallor.  Psychiatric: Mood, memory and judgment normal.  Vitals reviewed.  LABORATORY DATA: Lab Results  Component Value Date   WBC 12.2 (H) 11/11/2023   HGB 15.3 (H) 11/11/2023   HCT 46.7 (H) 11/11/2023   MCV 91.4 11/11/2023   PLT 288 11/11/2023      Chemistry      Component Value Date/Time   NA 138 11/11/2023 1503   K 3.7  11/11/2023 1503   CL 107 11/11/2023 1503   CO2 20 (L) 11/11/2023 1503   BUN 26 (H) 11/11/2023 1503   CREATININE 0.95 11/11/2023 1503   CREATININE 0.80 11/03/2023 1526      Component Value Date/Time   CALCIUM 9.2 11/11/2023 1503   ALKPHOS 58 05/16/2022 0454   AST 32 05/16/2022 0454   ALT 20 05/16/2022 0454   BILITOT 1.1 05/16/2022 0454       RADIOGRAPHIC STUDIES: IR VERTEBROPLASTY CERV/THOR BX INC UNI/BIL INC/INJECT/IMAGING Result Date: 11/16/2023 INDICATION: T8 vertebral lesion EXAM: T8 vertebroplasty and biopsy MEDICATIONS: As antibiotic prophylaxis, 2 g Ancef  was ordered pre-procedure and administered intravenously within 1 hour of incision. All current medications are in the EMR and have been reviewed as part of this encounter. ANESTHESIA/SEDATION: Moderate (conscious) sedation was employed during this procedure. A total of Versed  1.5 mg and Fentanyl  75 mcg was administered intravenously by the radiology nurse. Total intra-service moderate Sedation Time: 18 minutes. The  patient's level of consciousness and vital signs were monitored continuously by radiology nursing throughout the procedure under my direct supervision. FLUOROSCOPY: Radiation Exposure Index (as provided by the fluoroscopic device): 47.9 mGy Kerma COMPLICATIONS: None immediate. TECHNIQUE: Informed written consent was obtained from the patient after a thorough discussion of the procedural risks, benefits and alternatives. All questions were addressed. Maximal Sterile Barrier Technique was utilized including caps, mask, sterile gowns, sterile gloves, sterile drape, hand hygiene and skin antiseptic. A timeout was performed prior to the initiation of the procedure. PROCEDURE: The patient was placed prone on the fluoroscopic table. Nasal oxygen was administered. Physiologic monitoring was performed throughout the duration of the procedure. The skin overlying the thoracic region was prepped and draped in the usual sterile fashion. The T8 vertebral body was identified and the right pedicle was infiltrated with 0.25% Bupivacaine. This was then followed by the advancement of a 13-gauge needle through the right pedicle into the anterior one-third at T8. A biopsy was then obtained from the vertebra. Confidence cement was then injected with a good filling of both the right and left sides of the vertebral body and good filling from top to bottom. IMPRESSION: T8 vertebral body biopsy and vertebroplasty Electronically Signed   By: Nancyann Burns M.D.   On: 11/16/2023 11:55    ASSESSMENT:   PLAN:  The patient voices understanding of current disease status and treatment options and is in agreement with the current care plan.  All questions were answered. The patient knows to call the clinic with any problems, questions or concerns. We can certainly see the patient much sooner if necessary.  Thank you so much for allowing me to participate in the care of Carla Lane. I will continue to follow up the patient with you and  assist in her care.  I spent {CHL ONC TIME VISIT - DTPQU:[id number]} counseling the patient face to face. The total time spent in the appointment was {CHL ONC TIME VISIT - DTPQU:[id number]}.  Disclaimer: This note was dictated with voice recognition software. Similar sounding words can inadvertently be transcribed and may not be corrected upon review.   Min Collymore L Tanazia Achee November 26, 2023, 10:08 AM

## 2023-11-29 ENCOUNTER — Telehealth: Payer: Self-pay | Admitting: Physician Assistant

## 2023-11-29 ENCOUNTER — Inpatient Hospital Stay

## 2023-11-29 ENCOUNTER — Other Ambulatory Visit: Payer: Self-pay | Admitting: Physician Assistant

## 2023-11-29 ENCOUNTER — Inpatient Hospital Stay: Admitting: Physician Assistant

## 2023-11-29 DIAGNOSIS — C3431 Malignant neoplasm of lower lobe, right bronchus or lung: Secondary | ICD-10-CM

## 2023-11-29 NOTE — Progress Notes (Unsigned)
  [facility] [phone]   Fax:(336) [phone]  CONSULT NOTE  REFERRING PHYSICIAN: Dr. Patrcia  REASON FOR CONSULTATION:  Non-small cell lung cancer, adenocarcinoma  HPI Carla Lane is a 73 y.o. female with a medical history significant for hypertension, systolic heart failure, COPD, small bowel obstruction, and ***is referred to the clinic for non-small cell lung cancer.  The patient has a history of stage I (cT2a, cN0, cM0 non-small cell carcinoma of the right lower lobe for which she received SBRT in 5 fractions from 03/11/2022 to 03/23/2022.  She had a CT scan performed on 3/***/25 which was notable for new moderate compression deformity at T8 vertebral body with some kyphosis and canal encroachment.  This showed compression fracture at T7 resulting in approximately 50% of height loss at that level and associated enhancement.  There was also additional areas of enhancement of the inferior endplate T6 and superior endplate of T8 she had an MRI of the spine that showed moderate compression fracture of T8 with a probable metastatic lesion in the inferior vertebral body of T7 on the right and within the right T8 pedicle, right 7th and 8th rib and right T8 spinous process.  Dr. Patrcia offered PET but the patient has a severe allergy reaction to the FDG tracer.  The patient then saw interventional radiology who performed biopsy but the pathology was negative.  IR reached out and states that despite the negative result there was still suspicious for metastasis.  She had the T8 vertebral body biopsy performed and vertebroplasty on 11/11/2023.  Her pain has***at this time.  She takes Tylenol  and ibuprofen?  She saw Dr. Shelah but the patient was hesitant to undergo navigational bronchoscopy.   here is a moderate compression fracture of T8 with probable underlying metastatic disease in the right side of the vertebral body. There also appear to be metastatic lesions  within the inferior vertebral body of T7 on the right and within the right T8 pedicle, right seventh and eighth ribs and right T8 spinous process.  KRAS G12C  Underwent biopsy on *** which was negative for malignancy.   Today she is feeling ***.  She denies any fever, chills, night sweats, or unexplained weight loss.  She denies any chest pain, shortness of breath, cough, or hemoptysis.  She denies any nausea, vomiting, diarrhea, or constipation.  She denies any headache or visual changes.   HPI  Past Medical History:  Diagnosis Date   Anemia    during menstruating years   Anxiety    Arthritis    Cancer (HCC)    squamous (tip of nose) Basal - top of shoulder, top of each ears and top of forehead   Cataracts, bilateral 05/2017   Colon polyps    Depression    Dyspnea 11/2021   GERD (gastroesophageal reflux disease)    Hypertension    does not routinely take her blood pressure medications every day.   Pneumonia    Primary open angle glaucoma 10/2015   Bilateral   Rapid heart rate    during pre-menopause - thinks it was due to a HTN medication   Restless leg syndrome 07/16/2020   Right lower lobe lung mass 09/04/2021    Past Surgical History:  Procedure Laterality Date   APPENDECTOMY     BRONCHIAL BIOPSY  02/17/2022   Procedure: BRONCHIAL BIOPSIES;  Surgeon: Brenna Adine CROME, DO;  Location: [facility];  Service: Pulmonary;;   BRONCHIAL BRUSHINGS  02/17/2022   Procedure:  BRONCHIAL BRUSHINGS;  Surgeon: Brenna Adine CROME, DO;  Location: [facility];  Service: Pulmonary;;   BRONCHIAL NEEDLE ASPIRATION BIOPSY  02/17/2022   Procedure: BRONCHIAL NEEDLE ASPIRATION BIOPSIES;  Surgeon: Brenna Adine CROME, DO;  Location: [facility];  Service: Pulmonary;;   COLONOSCOPY     DENTAL SURGERY     FINE NEEDLE ASPIRATION  02/17/2022   Procedure: FINE NEEDLE ASPIRATION (FNA) LINEAR;  Surgeon: Brenna Adine CROME, DO;  Location: [facility];  Service: Pulmonary;;   IR VERTEBROPLASTY CERV/THOR BX  INC UNI/BIL INC/INJECT/IMAGING  11/11/2023   LAPAROSCOPIC SALPINGO OOPHERECTOMY     LINGUAL FRENECTOMY     at 9 years   MOHS SURGERY     VIDEO BRONCHOSCOPY WITH ENDOBRONCHIAL ULTRASOUND Bilateral 02/17/2022   Procedure: VIDEO BRONCHOSCOPY WITH ENDOBRONCHIAL ULTRASOUND;  Surgeon: Brenna Adine CROME, DO;  Location: [facility];  Service: Pulmonary;  Laterality: Bilateral;    No family history on file.  Social History Social History   Tobacco Use   Smoking status: Every Day    Current packs/day: 1.00    Average packs/day: 1 pack/day for 60.0 years (60.0 ttl pk-yrs)    Types: Cigarettes   Smokeless tobacco: Never   Tobacco comments:    1/2 pk daily 07/08/23  Vaping Use   Vaping status: Former   Devices: Did a trial study for vapes  Substance Use Topics   Alcohol use: Yes    Comment: occasional   Drug use: Never    Allergies  Allergen Reactions   Codeine Other (See Comments)    White spots on throat, Looks like strep throat (allergic reaction)    Current Outpatient Medications  Medication Sig Dispense Refill   albuterol  (VENTOLIN  HFA) 108 (90 Base) MCG/ACT inhaler Inhale 2 puffs into the lungs every 6 (six) hours as needed for wheezing or shortness of breath. 6.7 g 0   aspirin EC 325 MG tablet Take 325 mg by mouth every 6 (six) hours as needed for moderate pain.     BREZTRI AEROSPHERE 160-9-4.8 MCG/ACT AERO inhaler Inhale 2 puffs into the lungs daily.     Iodine TINC Apply 1 Application topically 2 (two) times a week.     latanoprost  (XALATAN ) 0.005 % ophthalmic solution Place 1 drop into both eyes at bedtime.     Multiple Vitamins-Minerals (EMERGEN-C IMMUNE PLUS) PACK Take 1 packet by mouth daily.     nicotine  (NICODERM CQ  - DOSED IN MG/24 HOURS) 14 mg/24hr patch Place 14 mg onto the skin daily as needed (nicotine  dependence).     Omega-3 Fatty Acids (SALMON OIL PO) Take 1 capsule by mouth 3 (three) times a week.     sacubitril-valsartan (ENTRESTO) 49-51 MG Take 0.5 tablets by  mouth daily.     Vitamin D, Ergocalciferol, (DRISDOL) 1.25 MG (50000 UNIT) CAPS capsule Take 50,000 Units by mouth every Monday.     No current facility-administered medications for this visit.    REVIEW OF SYSTEMS:   Review of Systems  Constitutional: Negative for appetite change, chills, fatigue, fever and unexpected weight change.  HENT:   Negative for mouth sores, nosebleeds, sore throat and trouble swallowing.   Eyes: Negative for eye problems and icterus.  Respiratory: Negative for cough, hemoptysis, shortness of breath and wheezing.   Cardiovascular: Negative for chest pain and leg swelling.  Gastrointestinal: Negative for abdominal pain, constipation, diarrhea, nausea and vomiting.  Genitourinary: Negative for bladder incontinence, difficulty urinating, dysuria, frequency and hematuria.   Musculoskeletal: Negative for back pain, gait problem, neck pain  and neck stiffness.  Skin: Negative for itching and rash.  Neurological: Negative for dizziness, extremity weakness, gait problem, headaches, light-headedness and seizures.  Hematological: Negative for adenopathy. Does not bruise/bleed easily.  Psychiatric/Behavioral: Negative for confusion, depression and sleep disturbance. The patient is not nervous/anxious.     PHYSICAL EXAMINATION:  There were no vitals taken for this visit.  ECOG PERFORMANCE STATUS: {CHL ONC ECOG [id number]  Physical Exam  Constitutional: Oriented to person, place, and time and well-developed, well-nourished, and in no distress. No distress.  HENT:  Head: Normocephalic and atraumatic.  Mouth/Throat: Oropharynx is clear and moist. No oropharyngeal exudate.  Eyes: Conjunctivae are normal. Right eye exhibits no discharge. Left eye exhibits no discharge. No scleral icterus.  Neck: Normal range of motion. Neck supple.  Cardiovascular: Normal rate, regular rhythm, normal heart sounds and intact distal pulses.   Pulmonary/Chest: Effort normal and breath  sounds normal. No respiratory distress. No wheezes. No rales.  Abdominal: Soft. Bowel sounds are normal. Exhibits no distension and no mass. There is no tenderness.  Musculoskeletal: Normal range of motion. Exhibits no edema.  Lymphadenopathy:    No cervical adenopathy.  Neurological: Alert and oriented to person, place, and time. Exhibits normal muscle tone. Gait normal. Coordination normal.  Skin: Skin is warm and dry. No rash noted. Not diaphoretic. No erythema. No pallor.  Psychiatric: Mood, memory and judgment normal.  Vitals reviewed.  LABORATORY DATA: Lab Results  Component Value Date   WBC 12.2 (H) 11/11/2023   HGB 15.3 (H) 11/11/2023   HCT 46.7 (H) 11/11/2023   MCV 91.4 11/11/2023   PLT 288 11/11/2023      Chemistry      Component Value Date/Time   NA 138 11/11/2023 1503   K 3.7 11/11/2023 1503   CL 107 11/11/2023 1503   CO2 20 (L) 11/11/2023 1503   BUN 26 (H) 11/11/2023 1503   CREATININE 0.95 11/11/2023 1503   CREATININE 0.80 11/03/2023 1526      Component Value Date/Time   CALCIUM 9.2 11/11/2023 1503   ALKPHOS 58 05/16/2022 0454   AST 32 05/16/2022 0454   ALT 20 05/16/2022 0454   BILITOT 1.1 05/16/2022 0454       RADIOGRAPHIC STUDIES: IR VERTEBROPLASTY CERV/THOR BX INC UNI/BIL INC/INJECT/IMAGING Result Date: 11/16/2023 INDICATION: T8 vertebral lesion EXAM: T8 vertebroplasty and biopsy MEDICATIONS: As antibiotic prophylaxis, 2 g Ancef  was ordered pre-procedure and administered intravenously within 1 hour of incision. All current medications are in the EMR and have been reviewed as part of this encounter. ANESTHESIA/SEDATION: Moderate (conscious) sedation was employed during this procedure. A total of Versed  1.5 mg and Fentanyl  75 mcg was administered intravenously by the radiology nurse. Total intra-service moderate Sedation Time: 18 minutes. The patient's level of consciousness and vital signs were monitored continuously by radiology nursing throughout the  procedure under my direct supervision. FLUOROSCOPY: Radiation Exposure Index (as provided by the fluoroscopic device): 47.9 mGy Kerma COMPLICATIONS: None immediate. TECHNIQUE: Informed written consent was obtained from the patient after a thorough discussion of the procedural risks, benefits and alternatives. All questions were addressed. Maximal Sterile Barrier Technique was utilized including caps, mask, sterile gowns, sterile gloves, sterile drape, hand hygiene and skin antiseptic. A timeout was performed prior to the initiation of the procedure. PROCEDURE: The patient was placed prone on the fluoroscopic table. Nasal oxygen was administered. Physiologic monitoring was performed throughout the duration of the procedure. The skin overlying the thoracic region was prepped and draped in the usual sterile fashion. The  T8 vertebral body was identified and the right pedicle was infiltrated with 0.25% Bupivacaine. This was then followed by the advancement of a 13-gauge needle through the right pedicle into the anterior one-third at T8. A biopsy was then obtained from the vertebra. Confidence cement was then injected with a good filling of both the right and left sides of the vertebral body and good filling from top to bottom. IMPRESSION: T8 vertebral body biopsy and vertebroplasty Electronically Signed   By: Nancyann Burns M.D.   On: 11/16/2023 11:55    ASSESSMENT: This is a very pleasant 73 year old Caucasian female that was initially diagnosed with stage Ib (cT2a, cN0, cM0 non-small cell carcinoma, adenocarcinoma of the right lower lobe diagnosed in December 2023. She was found to have concern for metastatic disease in 2025 with  moderate compression fracture of T8 with probable underlying metastatic disease in the right side of the vertebral body. There also appear to be metastatic lesions within the inferior vertebral body of T7 on the right and within the right T8 pedicle, right seventh and eighth ribs and  right T8 spinous process.  She initially underwent SBRT 05/12/2021 to 03/23/2022.under the care of Dr. Patrcia.   She was found to have concern for metastatic disease to the bone in March-April 2025.   She is unable to undergo PET scan due to reported allergy.   She underwent biopsy of the T8 lesion but biopsy was negative although it was noted that they were still suspicious.   ***reluctant for bronch  Dr. Sherrod had a lengthly discussion with the patient today about her current condition and treatment options. The patient was given the option of a referral to hospice/palliative vs. Treatment with systemic chemotherapy with carboplatin for an AUC of 5, Alimta 500 mg/m, and Keytruda 200 mg IV every 3 weeks.  The patient is interested in proceeding with systemic chemotherapy.  She is expected to start her first dose of this treatment on __.  We discussed the adverse side effects of treatment including but not limited to alopecia, myelosuppression, nausea and vomiting, peripheral neuropathy, liver or renal dysfunction as well as immunotherapy mediated adverse effects.   I will arrange for the patient to have a chemoeducation class prior to receiving her first cycle of chemotherapy.   We will arrange for the patient to have a B12 injection while in the clinic today.     I sent prescriptions for 1 mg folic acid p.o. daily as well as Compazine 10 mg every 6 hours as needed for nausea.   The patient will follow-up in 2 weeks for a one-week follow-up visit after completing her first cycle of chemotherapy.  ***Send moleculars   The patient voices understanding of current disease status and treatment options and is in agreement with the current care plan.  All questions were answered. The patient knows to call the clinic with any problems, questions or concerns. We can certainly see the patient much sooner if necessary.  Thank you so much for allowing me to participate in the care of Carla Lane. I will continue to follow up the patient with you and assist in her care.  I spent {CHL ONC TIME VISIT - DTPQU:[id number]} counseling the patient face to face. The total time spent in the appointment was {CHL ONC TIME VISIT - DTPQU:[id number]}.  Disclaimer: This note was dictated with voice recognition software. Similar sounding words can inadvertently be transcribed and may not be corrected upon review.   Garima Chronis L Arlo Butt  November 29, 2023, 1:09 PM

## 2023-11-29 NOTE — Telephone Encounter (Signed)
 Carla Lane called in asking why she has a new patient appointment for today and wants to know why she is being seen. I transferred her to Nurse Diane for further explanation.

## 2023-11-29 NOTE — Telephone Encounter (Signed)
 Pt received a text about an appt today .  What is this for ?   I was not planning to come to [facility] today. I am done with radiation .  I told her that Dr Patrcia wants Dr. Sherrod 's team to provided f/u , ordering scans, etc. She said ok ,but she needs an appt around 11 or after.   Pt confirmed appt on Thursday.

## 2023-12-02 ENCOUNTER — Inpatient Hospital Stay: Admitting: Physician Assistant

## 2023-12-02 ENCOUNTER — Telehealth: Payer: Self-pay | Admitting: Neuroradiology

## 2023-12-02 VITALS — BP 158/74 | HR 82 | Temp 97.5°F | Resp 15 | Wt 116.4 lb

## 2023-12-02 DIAGNOSIS — Z803 Family history of malignant neoplasm of breast: Secondary | ICD-10-CM

## 2023-12-02 DIAGNOSIS — Z800 Family history of malignant neoplasm of digestive organs: Secondary | ICD-10-CM | POA: Diagnosis not present

## 2023-12-02 DIAGNOSIS — I110 Hypertensive heart disease with heart failure: Secondary | ICD-10-CM

## 2023-12-02 DIAGNOSIS — Z806 Family history of leukemia: Secondary | ICD-10-CM

## 2023-12-02 DIAGNOSIS — M899 Disorder of bone, unspecified: Secondary | ICD-10-CM

## 2023-12-02 DIAGNOSIS — F17210 Nicotine dependence, cigarettes, uncomplicated: Secondary | ICD-10-CM | POA: Diagnosis not present

## 2023-12-02 DIAGNOSIS — J449 Chronic obstructive pulmonary disease, unspecified: Secondary | ICD-10-CM | POA: Diagnosis not present

## 2023-12-02 DIAGNOSIS — I509 Heart failure, unspecified: Secondary | ICD-10-CM

## 2023-12-02 DIAGNOSIS — C3431 Malignant neoplasm of lower lobe, right bronchus or lung: Secondary | ICD-10-CM

## 2023-12-02 NOTE — Telephone Encounter (Signed)
 Patient called and left voicemail to follow up from recent spinal surgery or procedure that she had done. Patient was curious if there was any metallic properties in the cement type stuff that was used, and she wanted to know if this would prevent her from getting her MRI done. Call back is [phone].

## 2023-12-03 NOTE — Progress Notes (Unsigned)
 Carla Aran, MD  [name] I sent message to Carla that they need a PET or full body CT to find other source for biopsy. Cancel order for spine biopsy.  GY       Previous Messages    ----- Message ----- From: [name] Sent: 12/03/2023   9:27 AM EDT To: Carla Lane; Taryn F Rigney, RT; Ir Proc* Subject: CT Biopsy                                      Procedure :CT Biopsy  Reason :Hx lung cancer, new bone lesions. Vertebroplasty and biopsy performed by Dr. Lester recently. Input if possible for repeat biopsy? Dx: Primary cancer of right lower lobe of lung (HCC) [C34.31 (ICD-10-CM)]    History :IR VERTEBROPLASTY CERV/THOR BX INC UNI/BIL INC/INJECT/IMAGING,MR Lumbar Spine W Wo Contrast ,MR THORACIC SPINE W WO CONTRAST,CT Chest W Contrast ,MR THORACIC SPINE W WO CONTRAST  Provider: Heilingoetter, Carla CROME, PA-C  Provider contact ;  [phone]

## 2023-12-06 ENCOUNTER — Telehealth: Payer: Self-pay | Admitting: Physician Assistant

## 2023-12-06 DIAGNOSIS — F064 Anxiety disorder due to known physiological condition: Secondary | ICD-10-CM | POA: Diagnosis not present

## 2023-12-06 DIAGNOSIS — C3431 Malignant neoplasm of lower lobe, right bronchus or lung: Secondary | ICD-10-CM | POA: Diagnosis not present

## 2023-12-06 DIAGNOSIS — I10 Essential (primary) hypertension: Secondary | ICD-10-CM | POA: Diagnosis not present

## 2023-12-06 NOTE — Telephone Encounter (Signed)
 I contacted the patient to review the treatment plan. Since malignancy cannot be confirmed and the patient is unable to undergo a PET scan, Dr. Sherrod does not recommend systemic treatment at this time. Similarly, Dr. Patrcia and the radiation oncology team do not recommend radiation therapy to the bone lesions without definitive proof or biopsy.  Interventional Radiology (IR) advised that repeating a biopsy of the bone lesion would likely have low diagnostic yield. This case was also discussed at the brain and spinal tumor board, as noted by Dr. Patrcia.  I reviewed these discussions and recommendations with the patient, including the option of monitoring the lesions. The patient is comfortable with this approach.  I also confirmed the patient's upcoming appointments: a follow-up with Ashlyn, PA-C in radiation oncology on 12/18 to review the repeat spine MRI, which is expected around 02/23/24; a CT of the chest, abdomen, and pelvis scheduled for December/January; and a follow-up with Dr. Sherrod. The patient agreed with this plan.

## 2023-12-06 NOTE — Telephone Encounter (Signed)
 Duplicate

## 2023-12-06 NOTE — Telephone Encounter (Signed)
 Discussed with patient over the phone

## 2023-12-06 NOTE — Telephone Encounter (Signed)
 Called the patient to review the recommendations after discussion with radiation and IR. Unable to reach her. I left a voicemail and asked that she call back. Otherwise, I would try calling her again tomorrow.

## 2023-12-22 ENCOUNTER — Encounter: Payer: Self-pay | Admitting: Neuroradiology

## 2023-12-22 NOTE — Progress Notes (Signed)
 The vertebroplasty and biopsy of the T8 compression fracture done on November 11, 2023 was done for a pathologic compression fracture, possibly benign and possibly malignant.

## 2023-12-23 DIAGNOSIS — H2513 Age-related nuclear cataract, bilateral: Secondary | ICD-10-CM | POA: Diagnosis not present

## 2023-12-23 DIAGNOSIS — H401132 Primary open-angle glaucoma, bilateral, moderate stage: Secondary | ICD-10-CM | POA: Diagnosis not present

## 2024-01-31 DIAGNOSIS — M9902 Segmental and somatic dysfunction of thoracic region: Secondary | ICD-10-CM | POA: Diagnosis not present

## 2024-01-31 DIAGNOSIS — M546 Pain in thoracic spine: Secondary | ICD-10-CM | POA: Diagnosis not present

## 2024-02-21 NOTE — Progress Notes (Incomplete)
 Follow up call to discuss results from T-spine and L-spine on 02/23/24.

## 2024-02-23 ENCOUNTER — Ambulatory Visit (HOSPITAL_COMMUNITY): Admission: RE | Admit: 2024-02-23 | Source: Ambulatory Visit

## 2024-02-23 ENCOUNTER — Ambulatory Visit (HOSPITAL_COMMUNITY): Attending: Urology

## 2024-02-28 ENCOUNTER — Inpatient Hospital Stay

## 2024-03-01 ENCOUNTER — Other Ambulatory Visit (HOSPITAL_COMMUNITY)

## 2024-03-01 ENCOUNTER — Ambulatory Visit (HOSPITAL_COMMUNITY)
Admission: RE | Admit: 2024-03-01 | Discharge: 2024-03-01 | Disposition: A | Discharge status: Home / Self Care | Source: Ambulatory Visit | Attending: Urology | Admitting: Urology

## 2024-03-01 ENCOUNTER — Ambulatory Visit (HOSPITAL_COMMUNITY): Admission: RE | Admit: 2024-03-01 | Discharge: 2024-03-01 | Attending: Urology | Admitting: Urology

## 2024-03-01 ENCOUNTER — Ambulatory Visit: Payer: Self-pay | Admitting: Urology

## 2024-03-01 DIAGNOSIS — C7951 Secondary malignant neoplasm of bone: Secondary | ICD-10-CM

## 2024-03-01 DIAGNOSIS — C3431 Malignant neoplasm of lower lobe, right bronchus or lung: Secondary | ICD-10-CM | POA: Diagnosis present

## 2024-03-01 DIAGNOSIS — R937 Abnormal findings on diagnostic imaging of other parts of musculoskeletal system: Secondary | ICD-10-CM | POA: Diagnosis not present

## 2024-03-01 MED ORDER — GADOBUTROL 1 MMOL/ML IV SOLN
5.0000 mL | Freq: Once | INTRAVENOUS | Status: AC | PRN
  Administered 2024-03-01: 19:00:00 5 mL via INTRAVENOUS

## 2024-03-02 ENCOUNTER — Ambulatory Visit: Admitting: Urology

## 2024-03-13 ENCOUNTER — Inpatient Hospital Stay

## 2024-03-13 NOTE — Progress Notes (Signed)
 Follow up call to receive results from L-spine and T-spine MRI on 03/01/24.  Nursing interview completed. Patient does have constant back pain (mid-lumbar) that wraps around to right side of ribs that she rates (3/10). No other complaints today.    L-Spine IMPRESSION: 1. Marrow edema within the left pedicles of L4 and L5, with edema about the adjacent left L4-5 facet. Findings are similar to previous MRI, and felt to be most consistent with degenerative facet arthritis. 2. No other MRI evidence for metastatic disease within the lumbar spine. 3. Chronic bilateral pars defects at L5 with associated 5 mm spondylolisthesis of L5 on S1. 4. Degenerative spondylosis at L2-3 with resultant mild canal with bilateral subarticular stenosis, with mild to moderate right L2 foraminal narrowing.  T-Spine IMPRESSION: 1. Previous vertebral augmentation at T8 with no further compression. Increased abnormal bone marrow signal in the right inferior articular process T8 2. Areas of abnormal bone signal in the right posterior T7 rib, right T7 inferior vertebral body are unchanged 3. New areas of abnormal signal in the right T9, T10 and T11 posterior ribs 4. Given the regional nature of all of these abnormalities, the differential includes metastatic disease and radiation necrosis

## 2024-03-22 ENCOUNTER — Ambulatory Visit
Admission: RE | Admit: 2024-03-22 | Discharge: 2024-03-22 | Disposition: A | Discharge status: Home / Self Care | Source: Ambulatory Visit | Attending: Urology | Admitting: Urology

## 2024-03-22 ENCOUNTER — Encounter: Payer: Self-pay | Admitting: Urology

## 2024-03-22 DIAGNOSIS — M4854XD Collapsed vertebra, not elsewhere classified, thoracic region, subsequent encounter for fracture with routine healing: Secondary | ICD-10-CM

## 2024-03-22 DIAGNOSIS — C3431 Malignant neoplasm of lower lobe, right bronchus or lung: Secondary | ICD-10-CM

## 2024-03-22 NOTE — Progress Notes (Signed)
 " Radiation Oncology         [phone] ________________________________  Name: Carla Lane MRN: [id number]  Date: 03/22/2024  DOB: 19-Feb-1951  Post Treatment Note  CC: [name], MD  [name], MD  Diagnosis:   74 yo woman with a history of T8 compression fracture and non-small cell carcinoma of the right lower lobe of the lung - Stage IB (cT2a, cN0, cM0) treated with SBRT in 07/2023 and a prior NSCLC in the RLL treated with SBRT in 03/2022.  Interval Since Last Radiation:  1 year, 8 months  08/02/23 - 08/12/23:  The enlarging lesion in the RLL (putative NSCLC) was treated to 50 Gy in 5 fractions of 10 Gy each  03/11/2022 through 03/23/2022 Site Technique Total Dose (Gy) Dose per Fx (Gy) Completed Fx Beam Energies  Lung, Right: Lung_R IMRT 60/60 12 5/5 6XFFF    Narrative:  I spoke with the patient to conduct her routine scheduled follow up visit via telephone to spare the patient unnecessary potential exposure in the healthcare setting during the current COVID-19 pandemic.  The patient was notified in advance and gave permission to proceed with this visit format.  She tolerated radiation therapy relatively well without any bothersome side effects.  She had a posttreatment CT chest scan on 05/18/23 which showed a good treatment response with decreased size of the treated right lower lobe pulmonary nodule with surrounding radiation treatment changes but no evidence of metastatic disease and stable appearance of scattered small groundglass and solid pulmonary nodules.    However, it also showed a new right lower lung mass separate from the treated nodule which appeared to be enlarging, measuring 3.5 cm in addition to a new T8 compression fracture. We reviewed those results by telephone on 05/27/23 and recommended further evaluation of the new T8 compression fracture with MRI T-spine to r/o pathologic compression fracture that might indicate a Stage IV lung cancer as opposed to a new or  recurrent early stage lung cancer. We also discussed getting pulmonology to weigh in regarding biopsy of the enlarging lung nodule for tissue confirmation. She met with Dr. Shelah on 07/08/23 and he felt confident that the lesion was reachable via navigational bronchoscopy for biopsy but the patient was very hesitant to proceed and instead preferred to consider empiric SBRT without tissue confirmation. The SBRT was completed in 07/2023 and tolerated well. Her follow up CT Chest imaging 09/29/23 was reassuring and without evidence of disease progression.  The MRI T-spine was performed on 06/11/23 but unfortunately was not resulted until 07/07/23. Fortunately, there was no evidence of cord compression and it was unclear as to whether the enhancement at T6-T8 was secondary to underlying metastasis or could be reactive to recent compression fracture at T7. Since the patient was having only occasional spine pain at that time and had no other evidence of stage IV cancer, we obtained  repeat T spine MRI on 10/05/23, demonstrating a moderate compression fracture of T8 with probable underlying metastatic disease in the right side of the vertebral body. There also appeared to be metastatic lesions within the inferior vertebral body of T7 on the right and within the right T8 pedicle, right seventh and eighth ribs and right T8 spinous process.  Further evaluation with FDG PET was recommended, but, the patient has had a severe allergic reaction to the FDG tracer so the decision was to refer for neuro-interventional radiology consult to discuss the option of vertebral biopsy and kyphoplasty since her pain had  become more constant and severe at that time. She met with Dr. Lester on 10/28/23 and elected to proceed with the recommended biopsy and vertebral augmentation which was performed on 11/11/23. Pathology was negative for malignancy and her pain has improved tremendously since that time.  She has established care with Dr. Sherrod in  medical oncology, for ongoing management of her systemic disease. Her case was presented in the multidisciplinary thoracic oncology conference and the multidisciplinary brain and spine tumor board and consensus recommendation was to forego any systemic therapy or additional radiation therapy to the bone lesions without tissue confirmation of malignancy.  Interventional radiology did not recommend repeating a biopsy of the bone lesion since this would likely have low diagnostic yield, so she has been followed in observation.  She is scheduled for follow-up CT C/A/P on 03/30/2024 and will see Dr. Gatha on April 06, 2024.  She had a recent repeat thoracic and lumbar MRI on 03/01/2024 that overall appears stable although there is some new abnormal signal in the right posterior T9, T10 and T11 ribs, felt most consistent with radiation effect secondary to her previous 2 courses of SBRT to the right lung, much less likely to be malignancy particularly in light of negative biopsy of T8 previously.  We reviewed these results by telephone today.                          On review of systems, the patient states she is doing well in general.  She did develop some dysphagia following her previous radiation but this has completely resolved at this point.  She does have some baseline indigestion but this has remained stable and is not interfering with her ability to eat and drink whatever she wants to.  She was hospitalized back in March 2024 with diverticulitis-this was her first episode and she has now fully recovered.  She has a chronic, baseline cough with production of clear to whitish sputum throughout the day, unchanged recently.  She specifically denies increased cough, shortness of breath, chest pain or hemoptysis.  She has not had any recent fever or chills.  She reports that her back pain is significantly improved, to the point that she remains pretty much pain-free throughout the majority of the day, developing  only mild pain in the evening after she has been active but not to the point that she needs to take any pain medications.. She denies any radiation of pain or paraesthesias in the upper or lower extremities, She has not noted any focal weakness in the upper or lower extremities and no changes in bowel or bladder control. Overall, she is pleased with her progress to date.  ALLERGIES:  is allergic to codeine.  Meds: Current Outpatient Medications  Medication Sig Dispense Refill   albuterol  (VENTOLIN  HFA) 108 (90 Base) MCG/ACT inhaler Inhale 2 puffs into the lungs every 6 (six) hours as needed for wheezing or shortness of breath. 6.7 g 0   aspirin EC 325 MG tablet Take 325 mg by mouth every 6 (six) hours as needed for moderate pain.     BREZTRI AEROSPHERE 160-9-4.8 MCG/ACT AERO inhaler Inhale 2 puffs into the lungs daily.     Iodine TINC Apply 1 Application topically 2 (two) times a week.     latanoprost  (XALATAN ) 0.005 % ophthalmic solution Place 1 drop into both eyes at bedtime.     Multiple Vitamins-Minerals (EMERGEN-C IMMUNE PLUS) PACK Take 1 packet by mouth daily.  Omega-3 Fatty Acids (SALMON OIL PO) Take 1 capsule by mouth 3 (three) times a week.     sacubitril-valsartan (ENTRESTO) 49-51 MG Take 0.5 tablets by mouth daily.     Vitamin D, Ergocalciferol, (DRISDOL) 1.25 MG (50000 UNIT) CAPS capsule Take 50,000 Units by mouth every Monday.     nicotine  (NICODERM CQ  - DOSED IN MG/24 HOURS) 14 mg/24hr patch Place 14 mg onto the skin daily as needed (nicotine  dependence). (Patient not taking: Reported on 03/22/2024)     No current facility-administered medications for this encounter.    Physical Findings:  vitals were not taken for this visit.  Pain Assessment Pain Score: 3  Pain Loc: Back/10 Unable to assess due to telephone follow up visit format.  Lab Findings: Lab Results  Component Value Date   WBC 12.2 (H) 11/11/2023   HGB 15.3 (H) 11/11/2023   HCT 46.7 (H) 11/11/2023   MCV 91.4  11/11/2023   PLT 288 11/11/2023     Radiographic Findings: MR THORACIC SPINE W WO CONTRAST Result Date: 03/07/2024 CLINICAL DATA:  Lung cancer, suspected metastasis EXAM: MRI THORACIC WITHOUT AND WITH CONTRAST TECHNIQUE: Multiplanar and multiecho pulse sequences of the thoracic spine were obtained without and with intravenous contrast. CONTRAST:  5 mL Gadavist  intravenously COMPARISON:  10/05/2023 FINDINGS: There has been vertebral augmentation at the T8 level. There are small foci of T1 hypointensity in the right side of the inferior endplate of T7 and right T7 rib which are unchanged. Previous vertebral augmentation at T8. There is abnormal signal in the posterior elements of T8 involving the superior and inferior articular processes. This is increased in the inferior articular process compared with the prior study. There is also abnormal signal in the right posterior T8 rib, which is unchanged There is abnormal signal in the posterior T9, T10 and T11 ribs which is new compared with the prior study. No epidural tumor is present. There is parenchymal disease in the right lung. IMPRESSION: 1. Previous vertebral augmentation at T8 with no further compression. Increased abnormal bone marrow signal in the right inferior articular process T8 2. Areas of abnormal bone signal in the right posterior T7 rib, right T7 inferior vertebral body are unchanged 3. New areas of abnormal signal in the right T9, T10 and T11 posterior ribs 4. Given the regional nature of all of these abnormalities, the differential includes metastatic disease and radiation necrosis Electronically Signed   By: Nancyann Burns M.D.   On: 03/07/2024 10:22   MR Lumbar Spine W Wo Contrast Result Date: 03/01/2024 CLINICAL DATA:  Initial evaluation for non-small cell lung cancer, evaluation for metastatic disease. EXAM: MRI LUMBAR SPINE WITHOUT AND WITH CONTRAST TECHNIQUE: Multiplanar and multiecho pulse sequences of the lumbar spine were obtained  without and with intravenous contrast. CONTRAST:  5mL GADAVIST  GADOBUTROL  1 MMOL/ML IV SOLN COMPARISON:  Prior MRI from 10/23/2023. FINDINGS: Segmentation: Standard. Lowest well-formed disc space labeled the L5-S1 level. Alignment: Moderate levoscoliosis. Chronic bilateral pars defects at L5 with associated 5 mm spondylolisthesis of L5 on S1. Vertebrae: Vertebral body height maintained without acute or chronic fracture. Bone marrow signal intensity diffusely heterogeneous. Marrow edema present within the left pedicles of L4 and L5, with edema about the adjacent L4-5 facet (series 10, image 17). Findings are similar to previous MRI, in felt to be most consistent with degenerative facet arthritis rather than a neoplastic process. No other evidence for metastatic disease elsewhere within the lumbar spine. Degenerative reactive endplate change with marrow edema noted about the  L2-3 interspace as well. Conus medullaris and cauda equina: Conus extends to the T12-L1 level. Conus and cauda equina appear normal. Paraspinal and other soft tissues: Paraspinous soft tissues within normal limits. Few scattered T2 hyperintense cyst noted about the visualized kidneys, benign in appearance, no follow-up imaging recommended. Previously identified left adrenal adenoma, partially visualized. Disc levels: L1-2:  Unremarkable. L2-3: Degenerative intervertebral disc space narrowing with diffuse disc bulge and reactive endplate spurring, asymmetric to the right. Mild bilateral facet hypertrophy. Resultant mild canal with bilateral subarticular stenosis. Mild to moderate right L2 foraminal stenosis. Left neural foramen remains patent. L3-4: Disc desiccation with mild disc bulge. Mild facet and ligament flavum hypertrophy with trace joint effusions. Resultant mild narrowing of the lateral recesses bilaterally. Central canal remains patent. No significant foraminal stenosis. L4-5: Disc desiccation with mild disc bulge. Moderate left worse  than right facet arthrosis with small joint effusions and reactive marrow edema on the left. Borderline mild narrowing of the lateral recesses bilaterally. Central canal remains patent. No significant foraminal stenosis. L5-S1: Chronic bilateral pars defects with 5 mm spondylolisthesis. Disc desiccation with broad posterior pseudo disc bulge/uncovering. Severe bilateral facet arthrosis, worse on the left. No spinal stenosis. Mild left L5 foraminal narrowing. Right neural foramen is patent. IMPRESSION: 1. Marrow edema within the left pedicles of L4 and L5, with edema about the adjacent left L4-5 facet. Findings are similar to previous MRI, and felt to be most consistent with degenerative facet arthritis. 2. No other MRI evidence for metastatic disease within the lumbar spine. 3. Chronic bilateral pars defects at L5 with associated 5 mm spondylolisthesis of L5 on S1. 4. Degenerative spondylosis at L2-3 with resultant mild canal with bilateral subarticular stenosis, with mild to moderate right L2 foraminal narrowing. Electronically Signed   By: Morene Hoard M.D.   On: 03/01/2024 19:42     Impression/Plan: 1. 74 yo woman with a history of T8 compression fracture and non-small cell carcinoma of the right lower lobe of the lung - Stage IB (cT2a, cN0, cM0) treated with SBRT in 07/2023 and a prior NSCLC in the RLL treated with SBRT in 03/2022.  She appears to have recovered well from the effects of her stereotactic body radiotherapy (SBRT) and is currently without complaints.    Her follow-up CT chest scan from September 29, 2023 was reassuring, without any evidence of disease progression and she is scheduled for repeat imaging with CT C/A/P on 03/30/2024, prior to her next scheduled follow-up visit with Dr. Sherrod on 04/06/2024.  Recent follow-up spine imaging from 03/01/2024 also appears overall stable although there is some new abnormal signal in the right posterior T9, T10 and T11 ribs, felt most consistent  with radiation effect secondary to her previous 2 courses of SBRT to the right lung, much less likely to be malignancy particularly in light of negative biopsy of T8 previously.  The recommendation is to continue monitoring with serial CT imaging going forward and we will repeat MRI scans as needed if she develops new pain or CT findings suggest the need for further evaluation.  Since she is seeing Dr. Gatha routinely with serial follow-up CT imaging, we will plan to see her back on an as-needed basis but she knows that she is welcome to call at anytime with any questions or concerns in the interim.  We are more than happy to continue to participate in her care if clinically indicated in the future.  I personally spent 30 minutes in this encounter including chart review,  reviewing radiological studies, telephone conversation with the patient, entering orders, coordinating care and completing documentation.   Sabra MICAEL Rusk, MMS, PA-C [facility] Radiation Oncology Physician Assistant Direct Dial: [phone]  Fax: [phone]   "

## 2024-03-30 ENCOUNTER — Ambulatory Visit (HOSPITAL_COMMUNITY)
Admission: RE | Admit: 2024-03-30 | Discharge: 2024-03-30 | Disposition: A | Discharge status: Home / Self Care | Source: Ambulatory Visit | Attending: Physician Assistant | Admitting: Physician Assistant

## 2024-03-30 DIAGNOSIS — C3431 Malignant neoplasm of lower lobe, right bronchus or lung: Secondary | ICD-10-CM | POA: Diagnosis present

## 2024-03-30 MED ORDER — IOHEXOL 300 MG/ML  SOLN
100.0000 mL | Freq: Once | INTRAMUSCULAR | Status: AC | PRN
  Administered 2024-03-30: 100 mL via INTRAVENOUS

## 2024-04-05 ENCOUNTER — Other Ambulatory Visit: Payer: Self-pay

## 2024-04-05 DIAGNOSIS — C3431 Malignant neoplasm of lower lobe, right bronchus or lung: Secondary | ICD-10-CM

## 2024-04-06 ENCOUNTER — Inpatient Hospital Stay: Admitting: Internal Medicine

## 2024-04-06 ENCOUNTER — Inpatient Hospital Stay: Attending: Physician Assistant

## 2024-04-06 VITALS — BP 151/84 | HR 92 | Temp 97.6°F | Resp 17 | Ht 63.0 in | Wt 114.5 lb

## 2024-04-06 DIAGNOSIS — F17210 Nicotine dependence, cigarettes, uncomplicated: Secondary | ICD-10-CM | POA: Diagnosis not present

## 2024-04-06 DIAGNOSIS — M546 Pain in thoracic spine: Secondary | ICD-10-CM | POA: Insufficient documentation

## 2024-04-06 DIAGNOSIS — M899 Disorder of bone, unspecified: Secondary | ICD-10-CM | POA: Diagnosis not present

## 2024-04-06 DIAGNOSIS — C3490 Malignant neoplasm of unspecified part of unspecified bronchus or lung: Secondary | ICD-10-CM | POA: Diagnosis not present

## 2024-04-06 DIAGNOSIS — Z923 Personal history of irradiation: Secondary | ICD-10-CM | POA: Insufficient documentation

## 2024-04-06 DIAGNOSIS — G8929 Other chronic pain: Secondary | ICD-10-CM | POA: Insufficient documentation

## 2024-04-06 DIAGNOSIS — R0789 Other chest pain: Secondary | ICD-10-CM | POA: Insufficient documentation

## 2024-04-06 DIAGNOSIS — C3431 Malignant neoplasm of lower lobe, right bronchus or lung: Secondary | ICD-10-CM | POA: Diagnosis present

## 2024-04-06 LAB — CMP (CANCER CENTER ONLY)
ALT: 23 U/L (ref 0–44)
AST: 28 U/L (ref 15–41)
Albumin: 4.5 g/dL (ref 3.5–5.0)
Alkaline Phosphatase: 101 U/L (ref 38–126)
Anion gap: 10 (ref 5–15)
BUN: 18 mg/dL (ref 8–23)
CO2: 27 mmol/L (ref 22–32)
Calcium: 9.6 mg/dL (ref 8.9–10.3)
Chloride: 102 mmol/L (ref 98–111)
Creatinine: 0.82 mg/dL (ref 0.44–1.00)
GFR, Estimated: >60 mL/min
Glucose, Bld: 184 mg/dL — ABNORMAL HIGH (ref 70–99)
Potassium: 4.3 mmol/L (ref 3.5–5.1)
Sodium: 140 mmol/L (ref 135–145)
Total Bilirubin: 0.5 mg/dL (ref 0.0–1.2)
Total Protein: 7.2 g/dL (ref 6.5–8.1)

## 2024-04-06 LAB — CBC WITH DIFFERENTIAL (CANCER CENTER ONLY)
Abs Immature Granulocytes: 0.02 K/uL (ref 0.00–0.07)
Basophils Absolute: 0.1 K/uL (ref 0.0–0.1)
Basophils Relative: 1 %
Eosinophils Absolute: 0 K/uL (ref 0.0–0.5)
Eosinophils Relative: 1 %
HCT: 43.3 % (ref 36.0–46.0)
Hemoglobin: 14.6 g/dL (ref 12.0–15.0)
Immature Granulocytes: 0 %
Lymphocytes Relative: 16 %
Lymphs Abs: 1.2 K/uL (ref 0.7–4.0)
MCH: 29.6 pg (ref 26.0–34.0)
MCHC: 33.7 g/dL (ref 30.0–36.0)
MCV: 87.7 fL (ref 80.0–100.0)
Monocytes Absolute: 0.7 K/uL (ref 0.1–1.0)
Monocytes Relative: 9 %
Neutro Abs: 5.3 K/uL (ref 1.7–7.7)
Neutrophils Relative %: 73 %
Platelet Count: 270 K/uL (ref 150–400)
RBC: 4.94 MIL/uL (ref 3.87–5.11)
RDW: 13.4 % (ref 11.5–15.5)
WBC Count: 7.2 K/uL (ref 4.0–10.5)
nRBC: 0 % (ref 0.0–0.2)

## 2024-04-06 NOTE — Progress Notes (Signed)
 "     [facility] [phone]   Fax:([phone]) [phone]  OFFICE PROGRESS NOTE  Carla Aland, MD [address] [place] [address]  DIAGNOSIS:  Stage IB lab and scan before next MD visit with Dr. Sherrod in 6 months (cT2a, cN0, cM0) non-small cell carcinoma, adenocarcinoma of the right lower lobe.  She has suspicious T8 vertebral and probably T7 and eighth rib bone metastasis in September 2026.  She was positive for KRASG12C. Her PDL1 expression is 5%.   PRIOR THERAPY: Status post SBRT in 5 fractions from 03/11/2022 to 03/23/2022  CURRENT THERAPY: Observation  INTERVAL HISTORY: Carla Lane 74 y.o. female returns to the clinic today for follow-up visit. Discussed the use of AI scribe software for clinical note transcription with the patient, who gave verbal consent to proceed.  History of Present Illness Carla Lane is a 74 year old female with stage I small cell lung cancer and adenocarcinoma, status post curative SBRT, who presents for surveillance imaging and evaluation of suspicious thoracic bone lesions.  She was diagnosed with stage I small cell lung cancer and adenocarcinoma of the right lower lobe, with KRAS G12C mutation and PD-L1 expression of 5%. She completed curative stereotactic body radiation therapy on March 23, 2022. She is currently under surveillance, with recent CT imaging of the chest, abdomen, and pelvis performed recently.  Suspicious lesions at T8 vertebra and possibly T7 and the 8th rib were first identified on MRI in September 2025, raising concern for possible bone metastases. Biopsy of these lesions was inconclusive for malignancy. She remains under observation for these findings, and no systemic therapy has been initiated.  She reports significant improvement in chronic thoracic back pain following vertebroplasty, with no constant pain currently. However, she continues to experience persistent neuropathic pain along the right lower  ribcage, sometimes severe with light touch and radiating from the back to the anterior chest. She otherwise feels well and denies new or worsening symptoms.     MEDICAL HISTORY: Past Medical History:  Diagnosis Date   Anemia    during menstruating years   Anxiety    Arthritis    Cancer (HCC)    squamous (tip of nose) Basal - top of shoulder, top of each ears and top of forehead   Cataracts, bilateral 05/2017   Colon polyps    Depression    Dyspnea 11/2021   GERD (gastroesophageal reflux disease)    Hypertension    does not routinely take her blood pressure medications every day.   Pneumonia    Primary open angle glaucoma 10/2015   Bilateral   Rapid heart rate    during pre-menopause - thinks it was due to a HTN medication   Restless leg syndrome 07/16/2020   Right lower lobe lung mass 09/04/2021    ALLERGIES:  is allergic to codeine.  MEDICATIONS:  Current Outpatient Medications  Medication Sig Dispense Refill   albuterol  (VENTOLIN  HFA) 108 (90 Base) MCG/ACT inhaler Inhale 2 puffs into the lungs every 6 (six) hours as needed for wheezing or shortness of breath. 6.7 g 0   aspirin EC 325 MG tablet Take 325 mg by mouth every 6 (six) hours as needed for moderate pain.     BREZTRI AEROSPHERE 160-9-4.8 MCG/ACT AERO inhaler Inhale 2 puffs into the lungs daily.     Iodine TINC Apply 1 Application topically 2 (two) times a week.     latanoprost  (XALATAN ) 0.005 % ophthalmic solution Place 1 drop into both eyes  at bedtime.     Multiple Vitamins-Minerals (EMERGEN-C IMMUNE PLUS) PACK Take 1 packet by mouth daily.     nicotine  (NICODERM CQ  - DOSED IN MG/24 HOURS) 14 mg/24hr patch Place 14 mg onto the skin daily as needed (nicotine  dependence). (Patient not taking: Reported on 03/22/2024)     Omega-3 Fatty Acids (SALMON OIL PO) Take 1 capsule by mouth 3 (three) times a week.     sacubitril-valsartan (ENTRESTO) 49-51 MG Take 0.5 tablets by mouth daily.     Vitamin D, Ergocalciferol,  (DRISDOL) 1.25 MG (50000 UNIT) CAPS capsule Take 50,000 Units by mouth every Monday.     No current facility-administered medications for this visit.    SURGICAL HISTORY:  Past Surgical History:  Procedure Laterality Date   APPENDECTOMY     BRONCHIAL BIOPSY  02/17/2022   Procedure: BRONCHIAL BIOPSIES;  Surgeon: Brenna Adine CROME, DO;  Location: [facility];  Service: Pulmonary;;   BRONCHIAL BRUSHINGS  02/17/2022   Procedure: BRONCHIAL BRUSHINGS;  Surgeon: Brenna Adine CROME, DO;  Location: [facility];  Service: Pulmonary;;   BRONCHIAL NEEDLE ASPIRATION BIOPSY  02/17/2022   Procedure: BRONCHIAL NEEDLE ASPIRATION BIOPSIES;  Surgeon: Brenna Adine CROME, DO;  Location: [facility];  Service: Pulmonary;;   COLONOSCOPY     DENTAL SURGERY     FINE NEEDLE ASPIRATION  02/17/2022   Procedure: FINE NEEDLE ASPIRATION (FNA) LINEAR;  Surgeon: Brenna Adine CROME, DO;  Location: [facility];  Service: Pulmonary;;   IR VERTEBROPLASTY CERV/THOR BX INC UNI/BIL INC/INJECT/IMAGING  11/11/2023   LAPAROSCOPIC SALPINGO OOPHERECTOMY     LINGUAL FRENECTOMY     at 9 years   MOHS SURGERY     VIDEO BRONCHOSCOPY WITH ENDOBRONCHIAL ULTRASOUND Bilateral 02/17/2022   Procedure: VIDEO BRONCHOSCOPY WITH ENDOBRONCHIAL ULTRASOUND;  Surgeon: Brenna Adine CROME, DO;  Location: [facility];  Service: Pulmonary;  Laterality: Bilateral;    REVIEW OF SYSTEMS:  Constitutional: negative Eyes: negative Ears, nose, mouth, throat, and face: negative Respiratory: negative Cardiovascular: negative Gastrointestinal: negative Genitourinary:negative Integument/breast: negative Hematologic/lymphatic: negative Musculoskeletal:positive for back pain Neurological: negative Behavioral/Psych: negative Endocrine: negative Allergic/Immunologic: negative   PHYSICAL EXAMINATION: General appearance: alert, cooperative, fatigued, and no distress Head: Normocephalic, without obvious abnormality, atraumatic Neck: no adenopathy, no JVD, supple,  symmetrical, trachea midline, and thyroid  not enlarged, symmetric, no tenderness/mass/nodules Lymph nodes: Cervical, supraclavicular, and axillary nodes normal. Resp: clear to auscultation bilaterally Back: symmetric, no curvature. ROM normal. No CVA tenderness. Cardio: regular rate and rhythm, S1, S2 normal, no murmur, click, rub or gallop GI: soft, non-tender; bowel sounds normal; no masses,  no organomegaly Extremities: extremities normal, atraumatic, no cyanosis or edema Neurologic: Alert and oriented X 3, normal strength and tone. Normal symmetric reflexes. Normal coordination and gait  ECOG PERFORMANCE STATUS: 1 - Symptomatic but completely ambulatory  Temperature 97.6 F (36.4 C), temperature source Temporal, resp. rate 17, height 5' 3 (1.6 m), weight 114 lb 8 oz (51.9 kg).  LABORATORY DATA: Lab Results  Component Value Date   WBC 7.2 04/06/2024   HGB 14.6 04/06/2024   HCT 43.3 04/06/2024   MCV 87.7 04/06/2024   PLT 270 04/06/2024      Chemistry      Component Value Date/Time   NA 140 04/06/2024 1033   K 4.3 04/06/2024 1033   CL 102 04/06/2024 1033   CO2 27 04/06/2024 1033   BUN 18 04/06/2024 1033   CREATININE 0.82 04/06/2024 1033   CREATININE 0.80 11/03/2023 1526      Component Value Date/Time  CALCIUM 9.6 04/06/2024 1033   ALKPHOS 101 04/06/2024 1033   AST 28 04/06/2024 1033   ALT 23 04/06/2024 1033   BILITOT 0.5 04/06/2024 1033       RADIOGRAPHIC STUDIES: CT CHEST ABDOMEN PELVIS W CONTRAST Result Date: 03/31/2024 CLINICAL DATA:  Right lower lobe lung cancer restaging * Tracking Code: BO * EXAM: CT CHEST, ABDOMEN, AND PELVIS WITH CONTRAST TECHNIQUE: Multidetector CT imaging of the chest, abdomen and pelvis was performed following the standard protocol during bolus administration of intravenous contrast. RADIATION DOSE REDUCTION: This exam was performed according to the departmental dose-optimization program which includes automated exposure control,  adjustment of the mA and/or kV according to patient size and/or use of iterative reconstruction technique. CONTRAST:  OMNIPAQUE  IOHEXOL  300 MG/ML  SOLN COMPARISON:  09/29/2023 FINDINGS: CT CHEST FINDINGS Cardiovascular: Aortic atherosclerosis. Normal heart size. No pericardial effusion. Mediastinum/Nodes: No enlarged mediastinal, hilar, or axillary lymph nodes. Thyroid  gland, trachea, and esophagus demonstrate no significant findings. Lungs/Pleura: Severe emphysema. Diminished size of a treated subpleural mass in the dependent superior segment right lower lobe measuring 2.2 x 1.8 cm, previously 2.9 x 2.6 cm (series 5, image 102). No significant change in an additional subpleural lesion medially in the superior segment right lower lobe measuring 2.2 x 1.0 cm with adjacent scarring (series 5, image 77). Bandlike scarring of the bilateral lung bases. No pleural effusion or pneumothorax. Musculoskeletal: No chest wall abnormality. No acute osseous findings. Unchanged wedge deformity of T8 status post cement augmentation. CT ABDOMEN PELVIS FINDINGS Hepatobiliary: No solid liver abnormality is seen. No gallstones, gallbladder wall thickening, or biliary dilatation. Pancreas: Unremarkable. No pancreatic ductal dilatation or surrounding inflammatory changes. Spleen: Normal in size without significant abnormality. Adrenals/Urinary Tract: Unchanged hypodense bilateral adrenal nodules, measuring 2.8 x 2.1 cm on the left (series 2, image 50) and 2.0 x 1.4 cm on the right (series 2, image 56). Kidneys are normal, without renal calculi, solid lesion, or hydronephrosis. Bladder is unremarkable. Stomach/Bowel: Stomach is within normal limits. Appendix not clearly visualized. No evidence of bowel wall thickening, distention, or inflammatory changes. Vascular/Lymphatic: Severe aortic atherosclerosis. No enlarged abdominal or pelvic lymph nodes. Reproductive: No mass or other abnormality. Other: No abdominal wall hernia or  abnormality. No ascites. Musculoskeletal: No acute osseous findings. IMPRESSION: 1. Diminished size of a treated subpleural mass in the dependent superior segment right lower lobe consistent with treatment response. 2. No significant change in an additional subpleural lesion medially in the superior segment right lower lobe with adjacent scarring. 3. No evidence of lymphadenopathy or metastatic disease in the chest, abdomen, or pelvis. 4. Unchanged hypodense bilateral adrenal nodules, previously characterized as benign adenomata. 5. Severe emphysema. Aortic Atherosclerosis (ICD10-I70.0) and Emphysema (ICD10-J43.9). Electronically Signed   By: Marolyn JONETTA Jaksch M.D.   On: 03/31/2024 11:43    ASSESSMENT AND PLAN: This is a very pleasant 74 years old white female with Stage IB lab and scan before next MD visit with Dr. Sherrod in 6 months (cT2a, cN0, cM0) non-small cell carcinoma, adenocarcinoma of the right lower lobe.  She has suspicious T8 vertebral and probably T7 and eighth rib bone metastasis in September 2026. She was positive for KRASG12C. Her PDL1 expression is 5%.  He is status post SBRT in 5 fractions from 03/11/2022 to 03/23/2022 She had repeat CT scan of the chest, abdomen and pelvis performed recently.  I personally independently reviewed the scan and discussed the result with the patient today.  Her scan showed no concerning findings for disease progression.  Assessment and Plan Assessment & Plan Stage I non-small cell lung cancer, status post curative SBRT, under surveillance Status post curative SBRT for stage I adenocarcinoma of the lung with KRAS G12C mutation and PD-L1 expression of 5%. Remains under surveillance. Most recent CT of chest, abdomen, and pelvis demonstrated no evidence of disease recurrence. She reports intermittent severe pain along the right lower ribcage, but no other new symptoms or concerns. - Reviewed recent CT imaging, confirming no evidence of recurrence. - Provided copy  of scan results. - Surveillance to continue with follow-up visit and repeat imaging in six months.  Suspicious thoracic vertebral and rib bone lesions, under observation Suspicious T8 vertebral and T7/T8 rib bone lesions previously identified on MRI, with inconclusive biopsy for malignancy. No new or progressive findings on recent imaging. Remains under observation due to lack of definitive evidence of malignancy. - Continue observation without intervention. - Reassess at next surveillance visit in six months.  Chronic thoracic back and right rib pain Chronic right-sided rib pain, likely neuropathic, possibly related to nerve involvement from prior vertebral pathology. Significant improvement in back pain since vertebroplasty. No constant pain, but intermittent severe pain persists along the right lower ribcage. - Assessed pain and discussed neuropathic etiology. - No new pain management interventions initiated. She was advised to call immediately if she has any other concerning symptoms in the interval. The patient voices understanding of current disease status and treatment options and is in agreement with the current care plan.  All questions were answered. The patient knows to call the clinic with any problems, questions or concerns. We can certainly see the patient much sooner if necessary. The total time spent in the appointment was 30 minutes including review of chart and various tests results, discussions about plan of care and coordination of care plan .   Disclaimer: This note was dictated with voice recognition software. Similar sounding words can inadvertently be transcribed and may not be corrected upon review.        "
